# Patient Record
Sex: Female | Born: 1965 | Race: Black or African American | Hispanic: No | Marital: Married | Smoking: Never smoker
Health system: Southern US, Community
[De-identification: ages and names within clinical notes are randomized; demographics above are authoritative.]

## PROBLEM LIST (undated history)

## (undated) DIAGNOSIS — F419 Anxiety disorder, unspecified: Secondary | ICD-10-CM

## (undated) DIAGNOSIS — M5432 Sciatica, left side: Secondary | ICD-10-CM

## (undated) DIAGNOSIS — Z973 Presence of spectacles and contact lenses: Secondary | ICD-10-CM

## (undated) DIAGNOSIS — R030 Elevated blood-pressure reading, without diagnosis of hypertension: Secondary | ICD-10-CM

## (undated) DIAGNOSIS — R9431 Abnormal electrocardiogram [ECG] [EKG]: Secondary | ICD-10-CM

## (undated) DIAGNOSIS — E669 Obesity, unspecified: Secondary | ICD-10-CM

## (undated) DIAGNOSIS — J019 Acute sinusitis, unspecified: Secondary | ICD-10-CM

## (undated) DIAGNOSIS — R011 Cardiac murmur, unspecified: Secondary | ICD-10-CM

## (undated) DIAGNOSIS — E785 Hyperlipidemia, unspecified: Secondary | ICD-10-CM

## (undated) DIAGNOSIS — D75839 Thrombocytosis, unspecified: Secondary | ICD-10-CM

## (undated) DIAGNOSIS — J309 Allergic rhinitis, unspecified: Secondary | ICD-10-CM

## (undated) DIAGNOSIS — Z923 Personal history of irradiation: Secondary | ICD-10-CM

## (undated) DIAGNOSIS — I10 Essential (primary) hypertension: Secondary | ICD-10-CM

## (undated) DIAGNOSIS — D649 Anemia, unspecified: Secondary | ICD-10-CM

## (undated) DIAGNOSIS — Z9889 Other specified postprocedural states: Secondary | ICD-10-CM

## (undated) DIAGNOSIS — J329 Chronic sinusitis, unspecified: Secondary | ICD-10-CM

## (undated) DIAGNOSIS — N950 Postmenopausal bleeding: Secondary | ICD-10-CM

## (undated) DIAGNOSIS — F32A Depression, unspecified: Secondary | ICD-10-CM

## (undated) DIAGNOSIS — M1990 Unspecified osteoarthritis, unspecified site: Secondary | ICD-10-CM

## (undated) DIAGNOSIS — R7303 Prediabetes: Secondary | ICD-10-CM

## (undated) HISTORY — DX: Anemia, unspecified: D64.9

## (undated) HISTORY — DX: Thrombocytosis, unspecified: D75.839

## (undated) HISTORY — DX: Cardiac murmur, unspecified: R01.1

## (undated) HISTORY — DX: Essential (primary) hypertension: I10

## (undated) HISTORY — DX: Acute sinusitis, unspecified: J01.90

## (undated) HISTORY — DX: Abnormal electrocardiogram (ECG) (EKG): R94.31

## (undated) HISTORY — DX: Personal history of irradiation: Z92.3

## (undated) HISTORY — DX: Sciatica, left side: M54.32

## (undated) HISTORY — DX: Allergic rhinitis, unspecified: J30.9

## (undated) HISTORY — DX: Morbid (severe) obesity due to excess calories: E66.01

## (undated) HISTORY — DX: Elevated blood-pressure reading, without diagnosis of hypertension: R03.0

## (undated) HISTORY — DX: Chronic sinusitis, unspecified: J32.9

## (undated) HISTORY — DX: Unspecified osteoarthritis, unspecified site: M19.90

## (undated) HISTORY — DX: Other specified postprocedural states: Z98.890

## (undated) HISTORY — DX: Hyperlipidemia, unspecified: E78.5

## (undated) HISTORY — DX: Obesity, unspecified: E66.9

---

## 1999-04-17 ENCOUNTER — Other Ambulatory Visit: Admission: RE | Admit: 1999-04-17 | Discharge: 1999-04-17 | Payer: Self-pay | Admitting: *Deleted

## 2000-04-07 ENCOUNTER — Encounter: Payer: Self-pay | Admitting: Internal Medicine

## 2000-04-07 ENCOUNTER — Encounter: Admission: RE | Admit: 2000-04-07 | Discharge: 2000-04-07 | Payer: Self-pay | Admitting: Internal Medicine

## 2000-04-16 ENCOUNTER — Other Ambulatory Visit: Admission: RE | Admit: 2000-04-16 | Discharge: 2000-04-16 | Payer: Self-pay | Admitting: *Deleted

## 2001-05-27 ENCOUNTER — Other Ambulatory Visit: Admission: RE | Admit: 2001-05-27 | Discharge: 2001-05-27 | Payer: Self-pay | Admitting: Obstetrics and Gynecology

## 2002-02-17 ENCOUNTER — Encounter: Admission: RE | Admit: 2002-02-17 | Discharge: 2002-02-17 | Payer: Self-pay | Admitting: Internal Medicine

## 2002-02-17 ENCOUNTER — Encounter: Payer: Self-pay | Admitting: Internal Medicine

## 2002-07-01 ENCOUNTER — Other Ambulatory Visit: Admission: RE | Admit: 2002-07-01 | Discharge: 2002-07-01 | Payer: Self-pay | Admitting: Obstetrics and Gynecology

## 2003-09-22 ENCOUNTER — Other Ambulatory Visit: Admission: RE | Admit: 2003-09-22 | Discharge: 2003-09-22 | Payer: Self-pay | Admitting: Obstetrics and Gynecology

## 2005-06-02 HISTORY — PX: BREAST EXCISIONAL BIOPSY: SUR124

## 2005-10-23 ENCOUNTER — Encounter: Admission: RE | Admit: 2005-10-23 | Discharge: 2005-10-23 | Payer: Self-pay | Admitting: Obstetrics and Gynecology

## 2005-11-27 ENCOUNTER — Encounter: Admission: RE | Admit: 2005-11-27 | Discharge: 2005-11-27 | Payer: Self-pay | Admitting: Obstetrics and Gynecology

## 2006-02-27 ENCOUNTER — Ambulatory Visit (HOSPITAL_BASED_OUTPATIENT_CLINIC_OR_DEPARTMENT_OTHER): Admission: RE | Admit: 2006-02-27 | Discharge: 2006-02-27 | Payer: Self-pay | Admitting: General Surgery

## 2006-02-27 ENCOUNTER — Encounter: Admission: RE | Admit: 2006-02-27 | Discharge: 2006-02-27 | Payer: Self-pay | Admitting: General Surgery

## 2006-02-27 ENCOUNTER — Encounter (INDEPENDENT_AMBULATORY_CARE_PROVIDER_SITE_OTHER): Payer: Self-pay | Admitting: Specialist

## 2007-02-03 ENCOUNTER — Encounter: Admission: RE | Admit: 2007-02-03 | Discharge: 2007-02-03 | Payer: Self-pay | Admitting: Obstetrics and Gynecology

## 2010-06-23 ENCOUNTER — Encounter: Payer: Self-pay | Admitting: Obstetrics and Gynecology

## 2010-06-23 ENCOUNTER — Encounter (HOSPITAL_BASED_OUTPATIENT_CLINIC_OR_DEPARTMENT_OTHER): Payer: Self-pay | Admitting: General Surgery

## 2010-10-18 NOTE — Op Note (Signed)
[name], Carla Patrick            ACCOUNT NO.:  [id number]   MEDICAL RECORD NO.:  [id number]          PATIENT TYPE:  AMB   LOCATION:  DSC                          FACILITY:  [facility]   PHYSICIAN:  Leonie Man, M.D.   DATE OF BIRTH:  06/18/1965   DATE OF PROCEDURE:  DATE OF DISCHARGE:                                 OPERATIVE REPORT   PREOPERATIVE DIAGNOSIS:  Lesion of the left breast, probable radial scar  versus carcinoma.   POSTOPERATIVE DIAGNOSIS:  Lesion of the left breast, probable radial scar  versus carcinoma, pathology pending.   PROCEDURE:  Needle localization and excision of biopsy of left breast  tissue.   SURGEON:  Ballen.   ASSISTANT:  OR nurse.   ANESTHESIA:  General.   SPECIMENS TO THE LABORATORY:  Breast tissue, 2 specimens.   ESTIMATED BLOOD LOSS:  Minimal.   COMPLICATIONS:  None.   The patient went to the PACU in excellent condition.   Carla Patrick is a 45 year old female who, on routine mammogram, was noted to  have a lesion within the left breast which is radiologically a radial scar,  but carcinoma cannot be ruled out.  She comes to the operating room  following a needle localization for excision of this area.   She understands the risks and potential benefits of surgery.  She gives  consent to same.   PROCEDURE:  The patient was positioned supinely, and the left breast was  prepped and draped to be included in the sterile operative field.  There is  a localizing needle within the substance of the left breast.  I made a  transverse incision just adjacent to the localizing wire, deepening this to  the skin and subcutaneous tissue, using the wire to a central point, then  excised a large wedge of breast tissue from the subcutaneous tissues all the  way down to the chest wall, taking approximately a total of 5- to 7-cm swath  of tissue.  This was removed and forwarded for radiologic evaluation.  The  specimen on mammography did not show the scar.   Following a conversation  with the radiologist, additional tissue on the inferior-superior aspect of  the biopsy cavity was also removed.  This also did not show a radial scar.  At this point, we decided not to continue any additional breast excision but  to get a mammogram in a few weeks just to see where, if indeed, there is a  residual radial scar.  Hemostasis was obtained with electrocautery.  Sponge  and instrument counts were verified.  The breast tissues were then  reconstructed and reapproximated with interrupted 2-0 Vicryl sutures.  The  subcutaneous tissue was closed with 3-0 Vicryl sutures and the skin closed  with  5-0 Monocryl suture in a running subcuticular fashion and the reinforced  with Steri-Strips.  A sterile dressing was applied.  Anesthesia reversed.  Patient removed from the operating room to the recovery room in stable  condition.  She tolerated the procedure well.      Leonie Man, M.D.  Electronically Signed     PB/MEDQ  D:  02/27/2006  T:  02/27/2006  Job:  [id number]   cc:   Leonie Man, M.D.

## 2011-06-09 ENCOUNTER — Other Ambulatory Visit: Payer: Self-pay | Admitting: Internal Medicine

## 2011-06-09 DIAGNOSIS — R1013 Epigastric pain: Secondary | ICD-10-CM

## 2011-06-10 ENCOUNTER — Ambulatory Visit
Admission: RE | Admit: 2011-06-10 | Discharge: 2011-06-10 | Disposition: A | Discharge status: Home / Self Care | Payer: BC Managed Care – PPO | Source: Ambulatory Visit | Attending: Internal Medicine | Admitting: Internal Medicine

## 2011-06-10 DIAGNOSIS — R1013 Epigastric pain: Secondary | ICD-10-CM

## 2011-06-11 ENCOUNTER — Encounter (INDEPENDENT_AMBULATORY_CARE_PROVIDER_SITE_OTHER): Payer: Self-pay | Admitting: General Surgery

## 2011-06-11 ENCOUNTER — Ambulatory Visit (INDEPENDENT_AMBULATORY_CARE_PROVIDER_SITE_OTHER): Payer: Self-pay | Admitting: General Surgery

## 2011-06-11 VITALS — BP 118/76 | HR 68 | Temp 97.4°F | Resp 17 | Ht 59.0 in | Wt 204.6 lb

## 2011-06-11 DIAGNOSIS — K8020 Calculus of gallbladder without cholecystitis without obstruction: Secondary | ICD-10-CM

## 2011-06-11 NOTE — Progress Notes (Signed)
Patient ID: Carla Patrick, female   DOB: February 11, 1966, 46 y.o.   MRN: [id number]  Chief Complaint  Patient presents with  . Other    Eval gallbladder    HPI Carla Patrick is a 46 y.o. female.   HPI A 46 year old Carla Patrick female referred by Dr. Merri Brunette for evaluation of abdominal pain. The patient states that she's been having some infrequent abdominal pain for several months. At first it was very intermittent; but, now it is occurring more frequent. She describes it as a pain in her upper abdomen that goes across like a rubber band. The discomfort Will last several hours. It does not radiate. She will have some occasional nausea. She denies any fevers or chills. She denies any weight loss, jaundice, acholic stools, or reflux. She denies any NSAID use. She has had a long-standing problem with constipation. She states that her mother had to have emergency gallbladder surgery over the holidays. She has not noticed any correlation to any particular foods. Past Medical History  Diagnosis Date  . Anemia   . Asthma   . Heart murmur   . Hyperlipidemia   . Abdominal pain     Past Surgical History  Procedure Date  . Breast lumpectomy 2007    left    History reviewed. No pertinent family history.  Social History History  Substance Use Topics  . Smoking status: Never Smoker   . Smokeless tobacco: Never Used  . Alcohol Use: Yes     occasional glass of wine    No Known Allergies  Current Outpatient Prescriptions  Medication Sig Dispense Refill  . buPROPion (WELLBUTRIN XL) 300 MG 24 hr tablet Take 300 mg by mouth daily.      . drospirenone-ethinyl estradiol (YAZ,GIANVI,LORYNA) 3-0.02 MG tablet Take 1 tablet by mouth daily.        Review of Systems Review of Systems  Constitutional: Negative for fever, chills and unexpected weight change.  HENT: Negative for hearing loss, congestion, sore throat, trouble swallowing and voice change.   Eyes: Negative for visual  disturbance.  Respiratory: Negative for cough and wheezing.   Cardiovascular: Negative for chest pain, palpitations and leg swelling.  Gastrointestinal: Positive for constipation. Negative for vomiting, diarrhea, blood in stool, abdominal distention and anal bleeding.  Genitourinary: Negative for hematuria, vaginal bleeding and difficulty urinating.  Musculoskeletal: Negative for arthralgias.  Skin: Negative for rash and wound.  Neurological: Negative for seizures, syncope and headaches.  Hematological: Negative for adenopathy. Does not bruise/bleed easily.  Psychiatric/Behavioral: Negative for confusion.    Blood pressure 118/76, pulse 68, temperature 97.4 F (36.3 C), temperature source Temporal, resp. rate 17, height 4\' 11"  (1.499 m), weight 204 lb 9.6 oz (92.806 kg).  Physical Exam Physical Exam  Vitals reviewed. Constitutional: She is oriented to person, place, and time. She appears well-developed and well-nourished. No distress.       obese  HENT:  Head: Normocephalic and atraumatic.  Eyes: Conjunctivae are normal. No scleral icterus.  Neck: Normal range of motion. Neck supple. No JVD present. No tracheal deviation present. No thyromegaly present.  Cardiovascular: Normal rate, regular rhythm and normal heart sounds.   Pulmonary/Chest: Effort normal and breath sounds normal. No respiratory distress. She has no wheezes.  Abdominal: Soft. Bowel sounds are normal. She exhibits no distension. There is no tenderness. There is no rebound.  Musculoskeletal: Normal range of motion. She exhibits no edema and no tenderness.  Neurological: She is alert and oriented to person, place, and  time. She exhibits normal muscle tone.  Skin: Skin is warm and dry. No rash noted. She is not diaphoretic. No erythema.  Psychiatric: She has a normal mood and affect. Her behavior is normal. Thought content normal.    Data Reviewed Abd u/s COMPLETE ABDOMINAL ULTRASOUND  Comparison: None.  Findings:    Gallbladder: There are multiple gallstones within the gallbladder  with acoustical shadowing. No pain is present over the gallbladder  with compression.  Common bile duct: The common bile duct is normal measuring 3.8 mm  in diameter.  Liver: The liver has a normal echogenic pattern. No ductal  dilatation is seen.  IVC: Appears normal.  Pancreas: No focal abnormality seen.  Spleen: The spleen is normal measuring 4.6 cm sagittally.  Right Kidney: No hydronephrosis is seen. The right kidney  measures 10.5 cm sagittally.  Left Kidney: No hydronephrosis. The left kidney measures 10.9 cm.  Abdominal aorta: The abdominal aorta is normal in caliber.  IMPRESSION:  1. Multiple gallstones within the gallbladder. No pain over the  gallbladder with compression.  2. No ductal dilatation.   Assessment    Symptomatic cholelithiasis    Plan    We discussed gallbladder disease. The patient was given Agricultural engineer. We discussed non-operative and operative management.   I discussed laparoscopic cholecystectomy with possible cholangiogram in detail.  The patient was given educational material as well as diagrams detailing the procedure.  We discussed the risks and benefits of a laparoscopic cholecystectomy including, but not limited to bleeding, infection, injury to surrounding structures such as the intestine or liver, bile leak, retained gallstones, need to convert to an open procedure, prolonged diarrhea, blood clots such as  DVT, common bile duct injury, anesthesia risks, and possible need for additional procedures.  We discussed the typical post-operative recovery course. I explained that the likelihood of improvement of their symptoms is good.  Mary Sella. Andrey Campanile, MD, FACS General, Bariatric, & Minimally Invasive Surgery [facility], [place]         [facility] M 06/11/2011, 11:43 AM

## 2011-06-11 NOTE — Patient Instructions (Signed)

## 2011-07-02 ENCOUNTER — Encounter (INDEPENDENT_AMBULATORY_CARE_PROVIDER_SITE_OTHER): Payer: Self-pay

## 2011-10-02 ENCOUNTER — Encounter (HOSPITAL_COMMUNITY): Payer: Self-pay | Admitting: *Deleted

## 2011-10-02 ENCOUNTER — Inpatient Hospital Stay (HOSPITAL_COMMUNITY)
Admission: EM | Admit: 2011-10-02 | Discharge: 2011-10-05 | DRG: 494 | Disposition: A | Discharge status: Home / Self Care | Payer: BC Managed Care – PPO | Attending: General Surgery | Admitting: General Surgery

## 2011-10-02 DIAGNOSIS — E785 Hyperlipidemia, unspecified: Secondary | ICD-10-CM | POA: Diagnosis present

## 2011-10-02 DIAGNOSIS — K8064 Calculus of gallbladder and bile duct with chronic cholecystitis without obstruction: Principal | ICD-10-CM | POA: Diagnosis present

## 2011-10-02 DIAGNOSIS — R011 Cardiac murmur, unspecified: Secondary | ICD-10-CM | POA: Diagnosis present

## 2011-10-02 DIAGNOSIS — K8060 Calculus of gallbladder and bile duct with cholecystitis, unspecified, without obstruction: Principal | ICD-10-CM | POA: Diagnosis present

## 2011-10-02 DIAGNOSIS — K8010 Calculus of gallbladder with chronic cholecystitis without obstruction: Secondary | ICD-10-CM

## 2011-10-02 DIAGNOSIS — J45909 Unspecified asthma, uncomplicated: Secondary | ICD-10-CM | POA: Diagnosis present

## 2011-10-02 LAB — CBC
HCT: 36.4 % (ref 36.0–46.0)
MCHC: 34.1 g/dL (ref 30.0–36.0)
Platelets: 415 10*3/uL — ABNORMAL HIGH (ref 150–400)
RDW: 12.3 % (ref 11.5–15.5)
WBC: 9.2 10*3/uL (ref 4.0–10.5)

## 2011-10-02 LAB — COMPREHENSIVE METABOLIC PANEL
ALT: 227 U/L — ABNORMAL HIGH (ref 0–35)
AST: 274 U/L — ABNORMAL HIGH (ref 0–37)
Albumin: 3.7 g/dL (ref 3.5–5.2)
Alkaline Phosphatase: 132 U/L — ABNORMAL HIGH (ref 39–117)
CO2: 22 mEq/L (ref 19–32)
Chloride: 96 mEq/L (ref 96–112)
Creatinine, Ser: 0.62 mg/dL (ref 0.50–1.10)
GFR calc non Af Amer: >90 mL/min (ref >90)
Potassium: 3.9 mEq/L (ref 3.5–5.1)
Sodium: 134 mEq/L — ABNORMAL LOW (ref 135–145)
Total Bilirubin: 2 mg/dL — ABNORMAL HIGH (ref 0.3–1.2)

## 2011-10-02 LAB — URINALYSIS, ROUTINE W REFLEX MICROSCOPIC
Bilirubin Urine: NEGATIVE
Glucose, UA: NEGATIVE mg/dL
Protein, ur: NEGATIVE mg/dL

## 2011-10-02 LAB — DIFFERENTIAL
Basophils Absolute: 0 10*3/uL (ref 0.0–0.1)
Basophils Relative: 0 % (ref 0–1)
Lymphocytes Relative: 16 % (ref 12–46)
Neutro Abs: 7.2 10*3/uL (ref 1.7–7.7)
Neutrophils Relative %: 79 % — ABNORMAL HIGH (ref 43–77)

## 2011-10-02 LAB — URINE MICROSCOPIC-ADD ON

## 2011-10-02 LAB — LIPASE, BLOOD: Lipase: 45 U/L (ref 11–59)

## 2011-10-02 MED ORDER — HYDROMORPHONE HCL PF 1 MG/ML IJ SOLN
1.0000 mg | Freq: Once | INTRAMUSCULAR | Status: AC
  Administered 2011-10-02: 1 mg via INTRAVENOUS
  Filled 2011-10-02: qty 1

## 2011-10-02 MED ORDER — CIPROFLOXACIN IN D5W 400 MG/200ML IV SOLN
400.0000 mg | Freq: Two times a day (BID) | INTRAVENOUS | Status: DC
  Administered 2011-10-02 – 2011-10-04 (×4): 400 mg via INTRAVENOUS
  Filled 2011-10-02 (×5): qty 200

## 2011-10-02 MED ORDER — ONDANSETRON HCL 4 MG/2ML IJ SOLN
4.0000 mg | Freq: Once | INTRAMUSCULAR | Status: AC
  Administered 2011-10-02: 4 mg via INTRAVENOUS
  Filled 2011-10-02: qty 2

## 2011-10-02 MED ORDER — ONDANSETRON HCL 4 MG/2ML IJ SOLN
4.0000 mg | Freq: Four times a day (QID) | INTRAMUSCULAR | Status: DC | PRN
  Administered 2011-10-04: 4 mg via INTRAVENOUS
  Filled 2011-10-02: qty 2

## 2011-10-02 MED ORDER — SODIUM CHLORIDE 0.9 % IV SOLN
INTRAVENOUS | Status: DC
  Administered 2011-10-02: 23:00:00 via INTRAVENOUS
  Administered 2011-10-03: 100 mL/h via INTRAVENOUS
  Administered 2011-10-04: 50 mL via INTRAVENOUS

## 2011-10-02 MED ORDER — ACETAMINOPHEN 650 MG RE SUPP: 650.0000 mg | Freq: Four times a day (QID) | RECTAL | Status: DC | PRN

## 2011-10-02 MED ORDER — SODIUM CHLORIDE 0.9 % IV SOLN
1000.0000 mL | Freq: Once | INTRAVENOUS | Status: AC
  Administered 2011-10-02: 1000 mL via INTRAVENOUS

## 2011-10-02 MED ORDER — SODIUM CHLORIDE 0.9 % IV SOLN
1000.0000 mL | INTRAVENOUS | Status: DC
  Administered 2011-10-02: 1000 mL via INTRAVENOUS

## 2011-10-02 MED ORDER — MORPHINE SULFATE 2 MG/ML IJ SOLN
2.0000 mg | INTRAMUSCULAR | Status: DC | PRN
  Administered 2011-10-04 (×3): 2 mg via INTRAVENOUS
  Filled 2011-10-02 (×3): qty 1

## 2011-10-02 MED ORDER — ACETAMINOPHEN 325 MG PO TABS: 650.0000 mg | ORAL_TABLET | Freq: Four times a day (QID) | ORAL | Status: DC | PRN

## 2011-10-02 NOTE — ED Notes (Signed)
Pt reports upper left abdominal pain starting 3AM with N/V/D.  Pt reports history of gallstones.  Pt took acetaminophen for pain 1 gram this AM without relief.

## 2011-10-02 NOTE — ED Notes (Signed)
Pt states that abdominal pain started at 3 am. Pt states she had vomiting, nausea and diarrhea. Vomited X 3, diarrhea X 3. Pain below breast line, goes across and more on left side. Pt took generic acetaminophen no relief. Diagnosed with gallstones 2 months ago.

## 2011-10-02 NOTE — ED Provider Notes (Signed)
History     CSN: [id number]  Arrival date & time 10/02/11  1022   First MD Initiated Contact with Patient 10/02/11 1040      Chief Complaint  Patient presents with  . Abdominal Pain  . Nausea  . Emesis  . Diarrhea    HPI Patient presents to the emergency room with complaints of right upper abdominal pain. Patient states she woke up about 3 AM with complaints of nausea vomiting and some loose stools.  The pain is severe in the epigastrium and right upper quadrant. It increases with palpation. She states it might radiate to the back little bit but that is relatively minor. She tried taking Tylenol without relief. Patient has had similar symptoms in the past although it was not as bad. She was diagnosed with cholelithiasis several months ago but has not followed up with a surgeon that she's been trying to wait until the end of the semester. Patient is a Runner, broadcasting/film/video. Past Medical History  Diagnosis Date  . Anemia   . Asthma   . Heart murmur   . Hyperlipidemia   . Abdominal pain   . Gallstones     Past Surgical History  Procedure Date  . Breast lumpectomy 2007    left    No family history on file.  History  Substance Use Topics  . Smoking status: Never Smoker   . Smokeless tobacco: Never Used  . Alcohol Use: Yes     occasional glass of wine    OB History    Grav Para Term Preterm Abortions TAB SAB Ect Mult Living                  Review of Systems  All other systems reviewed and are negative.    Allergies  Review of patient's allergies indicates no known allergies.  Home Medications   Current Outpatient Rx  Name Route Sig Dispense Refill  . BUPROPION HCL ER (XL) 300 MG PO TB24 Oral Take 300 mg by mouth daily.    . DROSPIRENONE-ETHINYL ESTRADIOL 3-0.02 MG PO TABS Oral Take 1 tablet by mouth daily.    Marland Kitchen HYDROCHLOROTHIAZIDE 12.5 MG PO TABS Oral Take 12.5 mg by mouth daily as needed. swelling      BP 158/92  Pulse 99  Temp(Src) 98.7 F (37.1 C) (Oral)  Resp 18   SpO2 100%  Physical Exam  Nursing note and vitals reviewed. Constitutional: She appears well-developed and well-nourished. No distress.  HENT:  Head: Normocephalic and atraumatic.  Right Ear: External ear normal.  Left Ear: External ear normal.  Eyes: Conjunctivae are normal. Right eye exhibits no discharge. Left eye exhibits no discharge. No scleral icterus.  Neck: Neck supple. No tracheal deviation present.  Cardiovascular: Normal rate, regular rhythm and intact distal pulses.   Pulmonary/Chest: Effort normal and breath sounds normal. No stridor. No respiratory distress. She has no wheezes. She has no rales.  Abdominal: Soft. Bowel sounds are normal. She exhibits no distension. There is tenderness in the right upper quadrant and epigastric area. There is guarding and positive Murphy's sign. There is no rigidity and no rebound.  Musculoskeletal: She exhibits no edema and no tenderness.  Neurological: She is alert. She has normal strength. No sensory deficit. Cranial nerve deficit:  no gross defecits noted. She exhibits normal muscle tone. She displays no seizure activity. Coordination normal.  Skin: Skin is warm and dry. No rash noted.  Psychiatric: She has a normal mood and affect.    ED  Course  Procedures (including critical care time)  Medications  hydrochlorothiazide (HYDRODIURIL) 12.5 MG tablet (not administered)  0.9 %  sodium chloride infusion (1000 mL Intravenous New Bag/Given 10/02/11 1125)    Followed by  0.9 %  sodium chloride infusion (1000 mL Intravenous New Bag/Given 10/02/11 1311)  HYDROmorphone (DILAUDID) injection 1 mg (1 mg Intravenous Given 10/02/11 1125)  ondansetron (ZOFRAN) injection 4 mg (4 mg Intravenous Given 10/02/11 1127)    Labs Reviewed  CBC - Abnormal; Notable for the following:    RBC 3.84 (*)    Platelets 415 (*)    All other components within normal limits  DIFFERENTIAL - Abnormal; Notable for the following:    Neutrophils Relative 79 (*)    All  other components within normal limits  COMPREHENSIVE METABOLIC PANEL - Abnormal; Notable for the following:    Sodium 134 (*)    Glucose, Bld 124 (*)    AST 274 (*)    ALT 227 (*)    Alkaline Phosphatase 132 (*)    Total Bilirubin 2.0 (*)    All other components within normal limits  URINALYSIS, ROUTINE W REFLEX MICROSCOPIC - Abnormal; Notable for the following:    Hgb urine dipstick SMALL (*)    Ketones, ur 40 (*)    Urobilinogen, UA 2.0 (*)    All other components within normal limits  LIPASE, BLOOD  URINE MICROSCOPIC-ADD ON   No results found.  1. Cholelithiasis     MDM  Pt presents with recurrent biliary colic.  Abdominal pain has improved however she does have elevated bilirubin, ?choledocholithiasis.  Pt had been trying to wait for the end of the school year before getting surgery but she may not be able to wait at this time.  Discussed with Dr Dwain Sarna who has seen the patient.  He plans on admitting her for surgery and serial observation of her bilirubin,        Celene Kras, MD 10/02/11 [phone]

## 2011-10-02 NOTE — H&P (Signed)
Carla Patrick is an 46 y.o. female.   Chief Complaint: ab pain, referred by Dr. Linwood Dibbles HPI: 75 yof with a couple year history of ruq/epigastric pain that has become more frequent in last 6 months.  This is associated with nausea and usually will pass on its own. She underwent ultrasound earlier this year with gallstones noted.  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  She has seen someone from my practice.  Today she began having epigastric and ruq pain that is not going away.  It is the worst and longest episode.  She still states it is sore but pain meds have helped.    Past Medical History  Diagnosis Date  . Anemia   . Asthma   . Heart murmur   . Hyperlipidemia   . Abdominal pain   . Gallstones     Past Surgical History  Procedure Date  . Breast excisional biopsy 2007    left    History reviewed. No pertinent family history. Social History:  reports that she has never smoked. She has never used smokeless tobacco. She reports that she drinks alcohol. She reports that she does not use illicit drugs.  Allergies: No Known Allergies   (Not in a hospital admission)  Results for orders placed during the hospital encounter of 10/02/11 (from the past 48 hour(s))  URINALYSIS, ROUTINE W REFLEX MICROSCOPIC     Status: Abnormal   Collection Time   10/02/11 10:56 AM      Component Value Range Comment   Color, Urine YELLOW  YELLOW     APPearance CLEAR  CLEAR     Specific Gravity, Urine 1.017  1.005 - 1.030     pH 7.5  5.0 - 8.0     Glucose, UA NEGATIVE  NEGATIVE (mg/dL)    Hgb urine dipstick SMALL (*) NEGATIVE     Bilirubin Urine NEGATIVE  NEGATIVE     Ketones, ur 40 (*) NEGATIVE (mg/dL)    Protein, ur NEGATIVE  NEGATIVE (mg/dL)    Urobilinogen, UA 2.0 (*) 0.0 - 1.0 (mg/dL)    Nitrite NEGATIVE  NEGATIVE     Leukocytes, UA NEGATIVE  NEGATIVE    URINE MICROSCOPIC-ADD ON     Status: Normal   Collection Time     10/02/11 10:56 AM      Component Value Range Comment   Squamous Epithelial / LPF RARE  RARE     WBC, UA 0-2  <3 (WBC/hpf)    RBC / HPF 3-6  <3 (RBC/hpf)    Bacteria, UA RARE  RARE    CBC     Status: Abnormal   Collection Time   10/02/11 11:20 AM      Component Value Range Comment   WBC 9.2  4.0 - 10.5 (K/uL)    RBC 3.84 (*) 3.87 - 5.11 (MIL/uL)    Hemoglobin 12.4  12.0 - 15.0 (g/dL)    HCT 16.1  09.6 - 04.5 (%)    MCV 94.8  78.0 - 100.0 (fL)    MCH 32.3  26.0 - 34.0 (pg)    MCHC 34.1  30.0 - 36.0 (g/dL)    RDW 40.9  81.1 - 91.4 (%)    Platelets 415 (*) 150 - 400 (K/uL)   DIFFERENTIAL     Status: Abnormal   Collection Time   10/02/11 11:20 AM      Component Value Range  Comment   Neutrophils Relative 79 (*) 43 - 77 (%)    Neutro Abs 7.2  1.7 - 7.7 (K/uL)    Lymphocytes Relative 16  12 - 46 (%)    Lymphs Abs 1.5  0.7 - 4.0 (K/uL)    Monocytes Relative 5  3 - 12 (%)    Monocytes Absolute 0.4  0.1 - 1.0 (K/uL)    Eosinophils Relative 0  0 - 5 (%)    Eosinophils Absolute 0.0  0.0 - 0.7 (K/uL)    Basophils Relative 0  0 - 1 (%)    Basophils Absolute 0.0  0.0 - 0.1 (K/uL)   COMPREHENSIVE METABOLIC PANEL     Status: Abnormal   Collection Time   10/02/11 11:20 AM      Component Value Range Comment   Sodium 134 (*) 135 - 145 (mEq/L)    Potassium 3.9  3.5 - 5.1 (mEq/L)    Chloride 96  96 - 112 (mEq/L)    CO2 22  19 - 32 (mEq/L)    Glucose, Bld 124 (*) 70 - 99 (mg/dL)    BUN 7  6 - 23 (mg/dL)    Creatinine, Ser 1.61  0.50 - 1.10 (mg/dL)    Calcium 9.4  8.4 - 10.5 (mg/dL)    Total Protein 7.6  6.0 - 8.3 (g/dL)    Albumin 3.7  3.5 - 5.2 (g/dL)    AST 096 (*) 0 - 37 (U/L)    ALT 227 (*) 0 - 35 (U/L)    Alkaline Phosphatase 132 (*) 39 - 117 (U/L)    Total Bilirubin 2.0 (*) 0.3 - 1.2 (mg/dL)    GFR calc non Af Amer >90  >90 (mL/min)    GFR calc Af Amer >90  >90 (mL/min)   LIPASE, BLOOD     Status: Normal   Collection Time   10/02/11 11:20 AM      Component Value Range Comment   Lipase  45  11 - 59 (U/L)    No results found.  Review of Systems  Constitutional: Negative for fever, chills and weight loss.  Respiratory: Negative for cough.   Cardiovascular: Negative for chest pain.  Gastrointestinal: Positive for nausea and abdominal pain. Negative for vomiting and diarrhea.    Blood pressure 125/65, pulse 80, temperature 98.3 F (36.8 C), temperature source Oral, resp. rate 16, SpO2 99.00%. Physical Exam  Vitals reviewed. Constitutional: She appears well-developed and well-nourished.  Eyes: No scleral icterus.  Neck: Neck supple.  Cardiovascular: Normal rate, regular rhythm and normal heart sounds.   Respiratory: Effort normal and breath sounds normal. She has no wheezes. She has no rales.  GI: Soft. Normal appearance and bowel sounds are normal. There is tenderness in the right upper quadrant and epigastric area. No hernia.     Assessment/Plan symptomatic cholelithiasis possible choledocholithiasis  I will admit her.  Her lfts are elevated and I would like to recheck in am.  If increasing then will consult gi.  If better then will plan for lap chole with cholangiogram tomorrow.  She understands plan.  Zelina Jimerson 10/02/2011, 1:41 PM

## 2011-10-03 LAB — CBC
Platelets: 333 10*3/uL (ref 150–400)
RBC: 3.21 MIL/uL — ABNORMAL LOW (ref 3.87–5.11)
RDW: 12.7 % (ref 11.5–15.5)
WBC: 8.9 10*3/uL (ref 4.0–10.5)

## 2011-10-03 LAB — COMPREHENSIVE METABOLIC PANEL
BUN: 6 mg/dL (ref 6–23)
CO2: 23 mEq/L (ref 19–32)
Chloride: 101 mEq/L (ref 96–112)
Creatinine, Ser: 0.71 mg/dL (ref 0.50–1.10)
GFR calc Af Amer: >90 mL/min (ref >90)
GFR calc non Af Amer: >90 mL/min (ref >90)
Glucose, Bld: 116 mg/dL — ABNORMAL HIGH (ref 70–99)
Total Bilirubin: 0.9 mg/dL (ref 0.3–1.2)

## 2011-10-03 NOTE — Progress Notes (Signed)
She is ready for cholecystectomy but will not be able to do today due to more urgent surgeries, will plan on lap chole tomorrow

## 2011-10-03 NOTE — Progress Notes (Signed)
  Subjective: Pt reports her pain is improved overall. She wonders if she can eat some today as her surgery will not be until tomorrow.  She is not passing much flatus, but her N/V have resolved.   Objective: Vital signs in last 24 hours: Temp:  [98 F (36.7 C)-98.9 F (37.2 C)] 98.9 F (37.2 C) (05/03 0607) Pulse Rate:  [80-99] 94  (05/03 0607) Resp:  [16-20] 20  (05/03 0607) BP: (118-158)/(65-92) 125/78 mmHg (05/03 0607) SpO2:  [99 %-100 %] 100 % (05/03 0607) Weight:  [93.441 kg (206 lb)] 93.441 kg (206 lb) (05/02 1528) Last BM Date: 10/02/11  Intake/Output from previous day: 05/02 0701 - 05/03 0700 In: 660 [P.O.:660] Out: -  Intake/Output this shift:    General appearance: alert, cooperative, appears stated age and no distress Resp: clear to auscultation bilaterally Cardio: regular rate and rhythm GI: soft, non-tender; bowel sounds normal; no masses,  no organomegaly  Lab Results:   Basename 10/03/11 0425 10/02/11 1120  WBC 8.9 9.2  HGB 10.5* 12.4  HCT 30.9* 36.4  PLT 333 415*   BMET  Basename 10/03/11 0425 10/02/11 1120  NA 138 134*  K 3.3* 3.9  CL 101 96  CO2 23 22  GLUCOSE 116* 124*  BUN 6 7  CREATININE 0.71 0.62  CALCIUM 8.4 9.4   PT/INR No results found for this basename: LABPROT:2,INR:2 in the last 72 hours ABG No results found for this basename: PHART:2,PCO2:2,PO2:2,HCO3:2 in the last 72 hours  Studies/Results: No results found.  Anti-infectives: Anti-infectives     Start     Dose/Rate Route Frequency Ordered Stop   10/02/11 1600   ciprofloxacin (CIPRO) IVPB 400 mg        400 mg 200 mL/hr over 60 Minutes Intravenous Every 12 hours 10/02/11 1527            Assessment/Plan: s/p * No surgery found * Symptomatic cholelithiasis- Will plan for OR tomorrow, May have clears today.    LOS: 1 day    Eileen Kangas,PA-C [phone]

## 2011-10-04 ENCOUNTER — Encounter (HOSPITAL_COMMUNITY): Payer: Self-pay | Admitting: Anesthesiology

## 2011-10-04 ENCOUNTER — Inpatient Hospital Stay (HOSPITAL_COMMUNITY): Payer: BC Managed Care – PPO

## 2011-10-04 ENCOUNTER — Encounter (HOSPITAL_COMMUNITY): Admission: EM | Disposition: A | Discharge status: Home / Self Care | Payer: Self-pay | Source: Home / Self Care

## 2011-10-04 ENCOUNTER — Inpatient Hospital Stay (HOSPITAL_COMMUNITY): Payer: BC Managed Care – PPO | Admitting: Anesthesiology

## 2011-10-04 HISTORY — PX: CHOLECYSTECTOMY: SHX55

## 2011-10-04 LAB — SURGICAL PCR SCREEN: MRSA, PCR: NEGATIVE

## 2011-10-04 SURGERY — LAPAROSCOPIC CHOLECYSTECTOMY WITH INTRAOPERATIVE CHOLANGIOGRAM
Anesthesia: General | Site: Abdomen | Wound class: Contaminated

## 2011-10-04 MED ORDER — ACETAMINOPHEN 650 MG RE SUPP: 650.0000 mg | Freq: Four times a day (QID) | RECTAL | Status: DC | PRN

## 2011-10-04 MED ORDER — LACTATED RINGERS IV SOLN
INTRAVENOUS | Status: DC | PRN
  Administered 2011-10-04: 08:00:00 via INTRAVENOUS

## 2011-10-04 MED ORDER — LIDOCAINE HCL (CARDIAC) 20 MG/ML IV SOLN
INTRAVENOUS | Status: DC | PRN
  Administered 2011-10-04: 20 mg via INTRAVENOUS

## 2011-10-04 MED ORDER — MIDAZOLAM HCL 5 MG/5ML IJ SOLN
INTRAMUSCULAR | Status: DC | PRN
  Administered 2011-10-04: 2 mg via INTRAVENOUS

## 2011-10-04 MED ORDER — BUPROPION HCL ER (XL) 300 MG PO TB24
300.0000 mg | ORAL_TABLET | Freq: Every day | ORAL | Status: DC
  Administered 2011-10-04: 300 mg via ORAL
  Filled 2011-10-04 (×2): qty 1

## 2011-10-04 MED ORDER — HYDROMORPHONE HCL PF 1 MG/ML IJ SOLN
INTRAMUSCULAR | Status: DC | PRN
  Administered 2011-10-04 (×4): 0.5 mg via INTRAVENOUS

## 2011-10-04 MED ORDER — LACTATED RINGERS IR SOLN
Status: DC | PRN
  Administered 2011-10-04: 1000 mL

## 2011-10-04 MED ORDER — BUPIVACAINE HCL (PF) 0.25 % IJ SOLN
INTRAMUSCULAR | Status: DC | PRN
  Administered 2011-10-04: 15 mL

## 2011-10-04 MED ORDER — NEOSTIGMINE METHYLSULFATE 1 MG/ML IJ SOLN
INTRAMUSCULAR | Status: DC | PRN
  Administered 2011-10-04: 5 mg via INTRAVENOUS

## 2011-10-04 MED ORDER — CISATRACURIUM BESYLATE 2 MG/ML IV SOLN
INTRAVENOUS | Status: DC | PRN
  Administered 2011-10-04 (×2): 1 mg via INTRAVENOUS
  Administered 2011-10-04: 10 mg via INTRAVENOUS

## 2011-10-04 MED ORDER — PROPOFOL 10 MG/ML IV EMUL
INTRAVENOUS | Status: DC | PRN
  Administered 2011-10-04: 200 mg via INTRAVENOUS

## 2011-10-04 MED ORDER — 0.9 % SODIUM CHLORIDE (POUR BTL) OPTIME
TOPICAL | Status: DC | PRN
  Administered 2011-10-04: 1000 mL

## 2011-10-04 MED ORDER — DEXAMETHASONE SODIUM PHOSPHATE 4 MG/ML IJ SOLN
INTRAMUSCULAR | Status: DC | PRN
  Administered 2011-10-04 (×2): 5 mg via INTRAVENOUS

## 2011-10-04 MED ORDER — IOHEXOL 300 MG/ML  SOLN
INTRAMUSCULAR | Status: DC | PRN
  Administered 2011-10-04: 50 mL

## 2011-10-04 MED ORDER — GLYCOPYRROLATE 0.2 MG/ML IJ SOLN
INTRAMUSCULAR | Status: DC | PRN
  Administered 2011-10-04: .6 mg via INTRAVENOUS

## 2011-10-04 MED ORDER — LIP MEDEX EX OINT
TOPICAL_OINTMENT | CUTANEOUS | Status: AC
  Administered 2011-10-04: 14:00:00
  Filled 2011-10-04: qty 7

## 2011-10-04 MED ORDER — KETAMINE HCL 10 MG/ML IJ SOLN
INTRAMUSCULAR | Status: DC | PRN
  Administered 2011-10-04 (×2): 5 mg via INTRAVENOUS

## 2011-10-04 MED ORDER — HYDROMORPHONE HCL PF 1 MG/ML IJ SOLN: 0.2500 mg | INTRAMUSCULAR | Status: DC | PRN

## 2011-10-04 MED ORDER — CIPROFLOXACIN IN D5W 400 MG/200ML IV SOLN
400.0000 mg | Freq: Two times a day (BID) | INTRAVENOUS | Status: AC
  Administered 2011-10-04 – 2011-10-05 (×2): 400 mg via INTRAVENOUS
  Filled 2011-10-04 (×2): qty 200

## 2011-10-04 MED ORDER — ONDANSETRON HCL 4 MG/2ML IJ SOLN
INTRAMUSCULAR | Status: DC | PRN
  Administered 2011-10-04 (×2): 2 mg via INTRAVENOUS

## 2011-10-04 MED ORDER — OXYCODONE HCL 5 MG PO TABS
5.0000 mg | ORAL_TABLET | ORAL | Status: DC | PRN
  Administered 2011-10-04 – 2011-10-05 (×3): 5 mg via ORAL
  Filled 2011-10-04: qty 1
  Filled 2011-10-04: qty 34
  Filled 2011-10-04: qty 1

## 2011-10-04 MED ORDER — ACETAMINOPHEN 325 MG PO TABS
650.0000 mg | ORAL_TABLET | Freq: Four times a day (QID) | ORAL | Status: DC | PRN
  Administered 2011-10-05: 650 mg via ORAL
  Filled 2011-10-04 (×2): qty 2

## 2011-10-04 MED ORDER — SUCCINYLCHOLINE CHLORIDE 20 MG/ML IJ SOLN
INTRAMUSCULAR | Status: DC | PRN
  Administered 2011-10-04: 140 mg via INTRAVENOUS

## 2011-10-04 MED ORDER — FENTANYL CITRATE 0.05 MG/ML IJ SOLN
INTRAMUSCULAR | Status: DC | PRN
  Administered 2011-10-04 (×3): 50 ug via INTRAVENOUS
  Administered 2011-10-04: 100 ug via INTRAVENOUS

## 2011-10-04 MED FILL — Cisatracurium Besylate (PF) IV Soln 10 MG/5ML (2 MG/ML): INTRAVENOUS | Qty: 10 | Status: AC

## 2011-10-04 SURGICAL SUPPLY — 43 items
ADH SKN CLS APL DERMABOND .7 (GAUZE/BANDAGES/DRESSINGS) ×1
APL SKNCLS STERI-STRIP NONHPOA (GAUZE/BANDAGES/DRESSINGS)
APPLIER CLIP 5 13 M/L LIGAMAX5 (MISCELLANEOUS) ×2
APPLIER CLIP ROT 10 11.4 M/L (STAPLE)
APR CLP MED LRG 11.4X10 (STAPLE)
APR CLP MED LRG 5 ANG JAW (MISCELLANEOUS) ×1
BAG SPEC RTRVL LRG 6X4 10 (ENDOMECHANICALS) ×1
BENZOIN TINCTURE PRP APPL 2/3 (GAUZE/BANDAGES/DRESSINGS) IMPLANT
CABLE HI FREQUENCY MONOPOLAR (ELECTROSURGICAL) ×2 IMPLANT
CANISTER SUCTION 2500CC (MISCELLANEOUS) ×2 IMPLANT
CLIP APPLIE 5 13 M/L LIGAMAX5 (MISCELLANEOUS) ×1 IMPLANT
CLIP APPLIE ROT 10 11.4 M/L (STAPLE) IMPLANT
CLOTH BEACON ORANGE TIMEOUT ST (SAFETY) ×2 IMPLANT
COVER MAYO STAND STRL (DRAPES) ×1 IMPLANT
DECANTER SPIKE VIAL GLASS SM (MISCELLANEOUS) ×2 IMPLANT
DERMABOND ADVANCED (GAUZE/BANDAGES/DRESSINGS) ×1
DERMABOND ADVANCED .7 DNX12 (GAUZE/BANDAGES/DRESSINGS) IMPLANT
DRAPE C-ARM 42X72 X-RAY (DRAPES) ×2 IMPLANT
DRAPE LAPAROSCOPIC ABDOMINAL (DRAPES) ×2 IMPLANT
ELECT REM PT RETURN 9FT ADLT (ELECTROSURGICAL) ×2
ELECTRODE REM PT RTRN 9FT ADLT (ELECTROSURGICAL) ×1 IMPLANT
GLOVE BIO SURGEON STRL SZ7 (GLOVE) ×2 IMPLANT
GLOVE BIOGEL PI IND STRL 7.5 (GLOVE) ×1 IMPLANT
GLOVE BIOGEL PI INDICATOR 7.5 (GLOVE) ×1
GOWN PREVENTION PLUS LG XLONG (DISPOSABLE) ×2 IMPLANT
GOWN PREVENTION PLUS XLARGE (GOWN DISPOSABLE) ×2 IMPLANT
GOWN STRL NON-REIN LRG LVL3 (GOWN DISPOSABLE) ×2 IMPLANT
GOWN STRL REIN XL XLG (GOWN DISPOSABLE) ×2 IMPLANT
HEMOSTAT SNOW SURGICEL 2X4 (HEMOSTASIS) ×1 IMPLANT
KIT BASIN OR (CUSTOM PROCEDURE TRAY) ×2 IMPLANT
NS IRRIG 1000ML POUR BTL (IV SOLUTION) ×2 IMPLANT
POUCH SPECIMEN RETRIEVAL 10MM (ENDOMECHANICALS) ×2 IMPLANT
SET CHOLANGIOGRAPH MIX (MISCELLANEOUS) IMPLANT
SET IRRIG TUBING LAPAROSCOPIC (IRRIGATION / IRRIGATOR) ×2 IMPLANT
SOLUTION ANTI FOG 6CC (MISCELLANEOUS) ×2 IMPLANT
STRIP CLOSURE SKIN 1/2X4 (GAUZE/BANDAGES/DRESSINGS) IMPLANT
SUT MNCRL AB 4-0 PS2 18 (SUTURE) ×2 IMPLANT
TOWEL OR 17X26 10 PK STRL BLUE (TOWEL DISPOSABLE) ×2 IMPLANT
TRAY LAP CHOLE (CUSTOM PROCEDURE TRAY) ×2 IMPLANT
TROCAR BLADELESS OPT 5 75 (ENDOMECHANICALS) ×6 IMPLANT
TROCAR XCEL BLUNT TIP 100MML (ENDOMECHANICALS) ×2 IMPLANT
TROCAR XCEL NON-BLD 11X100MML (ENDOMECHANICALS) IMPLANT
TUBING INSUFFLATION 10FT LAP (TUBING) ×2 IMPLANT

## 2011-10-04 NOTE — Interval H&P Note (Signed)
History and Physical Interval Note:  10/04/2011 7:55 AM  Carla Patrick  has presented today for surgery, with the diagnosis of cholecystitis  The various methods of treatment have been discussed with the patient and family. After consideration of risks, benefits and other options for treatment, the patient has consented to  Procedure(s) (LRB): LAPAROSCOPIC CHOLECYSTECTOMY WITH INTRAOPERATIVE CHOLANGIOGRAM (N/A) as a surgical intervention .  The patients' history has been reviewed, patient examined, no change in status, stable for surgery.  I have reviewed the patients' chart and labs.  Questions were answered to the patient's satisfaction.     Kelsen Celona

## 2011-10-04 NOTE — Op Note (Signed)
Preoperative diagnosis: Symptomatic cholelithiasis Postoperative diagnosis: Chronic cholecystitis Procedure: Laparoscopic cholecystectomy with cholangiogram Surgeon: Dr. Harden Mo Asst.: Dr. Avel Peace Specimens: Gallbladder and contents to pathology Complications: None Estimated blood loss: Minimal Anesthesia: Gen. Endotracheal Disposition to recovery in stable condition Sponge and needle count correct x2 at end of operation  Indications: This is a 46 year old female who was seen by one of my partners for biliary colic a number of months ago. She had an ultrasound showing stones. 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 She then had acute worsening of her pain. Her bilirubin was mildly elevated. This returned to normal the following day. Her transaminases were still mildly elevated. We discussed a laparoscopic cholecystectomy and the risks and benefits associated with that.  Procedure: After informed consent was obtained the patient was taken to the operating room. She was administered ciprofloxacin on the floor. Sequential compression devices were on her legs. She was then placed under general endotracheal anesthesia without complication. Her abdomen was then prepped and draped in the standard sterile surgical fashion. A surgical timeout was then performed.  I infiltrated Marcaine below her umbilicus. I made a vertical incision and carried this down to her fascia. Her fascia was entered sharply. Her peritoneum was entered bluntly. I then placed a 0 Vicryl pursestring suture through the fascia. A Hassan trocar was introduced. The abdomen was insufflated to 15 mmHg pressure. I then placed 3 further 5 mm trocars in the epigastrium and right upper quadrant under direct vision after infiltration with local anesthetic without complication. The gallbladder was then retracted cephalad. There were some adhesions to her duodenum which were taken down bluntly. Her  triangle was very scarred in. It took some time but eventually I was able to obtain the critical view of safety. I then clipped the artery and divided it. I then clipped the duct distally. I introduced a Cook catheter through a ductotomy. I then clipped this down. A cholangiogram was then performed. This showed some very slow filling of the duodenum. I think this is mostly due to spasm of her sphincter. There were no stones that were identified. It also showed that I was in the cystic duct and filling of the liver on both sides. I then removed the catheter. I clipped the duct 3 times and divided it. I then removed the gallbladder from the liver bed with some difficulty as it was chronically scarred to the liver. He was then placed in an Endo Catch bag and removed from the umbilicus. I had to enlarge this incision a little bit just to get the gallbladder out. I then obtained hemostasis. I did place a piece of Surgicel overlying the gallbladder bed. Irrigation was performed until it was clear. I then removed the [facility] trocar. I tied my pursestring and this completely obliterated the defect. There was no evidence of an entry injury. I then desufflated the abdomen removed all trocars. The incisions were closed for Monocryl and Dermabond. She tolerated this well was extubated and transferred to recovery stable.

## 2011-10-04 NOTE — Anesthesia Postprocedure Evaluation (Signed)
  Anesthesia Post-op Note  Patient: Carla Patrick  Procedure(s) Performed: Procedure(s) (LRB): LAPAROSCOPIC CHOLECYSTECTOMY WITH INTRAOPERATIVE CHOLANGIOGRAM (N/A)  Patient Location: PACU  Anesthesia Type: General  Level of Consciousness: oriented and sedated  Airway and Oxygen Therapy: Patient Spontanous Breathing and Patient connected to nasal cannula oxygen  Post-op Pain: mild  Post-op Assessment: Post-op Vital signs reviewed, Patient's Cardiovascular Status Stable, Respiratory Function Stable and Patent Airway  Post-op Vital Signs: stable  Complications: No apparent anesthesia complications

## 2011-10-04 NOTE — Transfer of Care (Signed)
Immediate Anesthesia Transfer of Care Note  Patient: Carla Patrick  Procedure(s) Performed: Procedure(s) (LRB): LAPAROSCOPIC CHOLECYSTECTOMY WITH INTRAOPERATIVE CHOLANGIOGRAM (N/A)  Patient Location: PACU  Anesthesia Type: General  Level of Consciousness: awake and alert   Airway & Oxygen Therapy: Patient Spontanous Breathing  Post-op Assessment: Report given to PACU RN and Post -op Vital signs reviewed and stable  Post vital signs: Reviewed and stable  Complications: No apparent anesthesia complications

## 2011-10-04 NOTE — Anesthesia Preprocedure Evaluation (Signed)
Anesthesia Evaluation  Patient identified by MRN, date of birth, ID band Patient awake    Reviewed: Allergy & Precautions, H&P , NPO status , Patient's Chart, lab work & pertinent test results, reviewed documented beta blocker date and time   Airway Mallampati: II TM Distance: >3 FB Neck ROM: Full    Dental  (+) Teeth Intact and Dental Advisory Given   Pulmonary asthma ,  Inhaler prn, infrequent use breath sounds clear to auscultation        Cardiovascular negative cardio ROS  Rhythm:Regular Rate:Normal  Denies cardiac symptoms   Neuro/Psych negative neurological ROS  negative psych ROS   GI/Hepatic Neg liver ROS, cholecystitis   Endo/Other  negative endocrine ROSMorbid obesity  Renal/GU negative Renal ROS  negative genitourinary   Musculoskeletal negative musculoskeletal ROS (+)   Abdominal   Peds negative pediatric ROS (+)  Hematology Anemia Hgb 10.5   Anesthesia Other Findings   Reproductive/Obstetrics negative OB ROS                           Anesthesia Physical Anesthesia Plan  ASA: II  Anesthesia Plan: General   Post-op Pain Management:    Induction: Intravenous  Airway Management Planned: Oral ETT  Additional Equipment:   Intra-op Plan:   Post-operative Plan: Extubation in OR  Informed Consent: I have reviewed the patients History and Physical, chart, labs and discussed the procedure including the risks, benefits and alternatives for the proposed anesthesia with the patient or authorized representative who has indicated his/her understanding and acceptance.   Dental advisory given  Plan Discussed with: CRNA and Surgeon  Anesthesia Plan Comments:         Anesthesia Quick Evaluation

## 2011-10-05 LAB — COMPREHENSIVE METABOLIC PANEL
ALT: 172 U/L — ABNORMAL HIGH (ref 0–35)
Albumin: 3 g/dL — ABNORMAL LOW (ref 3.5–5.2)
Alkaline Phosphatase: 127 U/L — ABNORMAL HIGH (ref 39–117)
GFR calc Af Amer: >90 mL/min (ref >90)
Glucose, Bld: 189 mg/dL — ABNORMAL HIGH (ref 70–99)
Potassium: 3.6 mEq/L (ref 3.5–5.1)
Sodium: 134 mEq/L — ABNORMAL LOW (ref 135–145)
Total Protein: 6.6 g/dL (ref 6.0–8.3)

## 2011-10-05 MED ORDER — OXYCODONE HCL 5 MG PO TABS: 5.0000 mg | ORAL_TABLET | ORAL | Status: AC | PRN

## 2011-10-05 NOTE — Progress Notes (Signed)
Pt discharged to home with mother and sister provided discharge instructions and prescriptions along with handouts. Pt verbalized understanding of discharge information. Pt stable. Pt transported by tech IV removed and documented. Jaydah Stahle Howell, RN    

## 2011-10-05 NOTE — Discharge Instructions (Signed)
[facility] LAPAROSCOPIC SURGERY: POST OP INSTRUCTIONS  Always review your discharge instruction sheet given to you by the facility where your surgery was performed. IF YOU HAVE DISABILITY OR FAMILY LEAVE FORMS, YOU MUST BRING THEM TO THE OFFICE FOR PROCESSING.   DO NOT GIVE THEM TO YOUR DOCTOR.  1. A prescription for pain medication may be given to you upon discharge.  Take your pain medication as prescribed, if needed.  If narcotic pain medicine is not needed, then you may take acetaminophen (Tylenol), naprosyn (Alleve), or ibuprofen (Advil) as needed. 2. Take your usually prescribed medications unless otherwise directed. 3. If you need a refill on your pain medication, please contact your pharmacy.  They will contact our office to request authorization. Prescriptions will not be filled after 5pm or on week-ends. 4. You should follow a light diet the first few days after arrival home, such as soup and crackers, etc.  Be sure to include lots of fluids daily. 5. Most patients will experience some swelling and bruising in the area of the incisions.  Ice packs will help.  Swelling and bruising can take several days to resolve.  6. It is common to experience some constipation if taking pain medication after surgery.  Increasing fluid intake and taking a stool softener (such as Colace) will usually help or prevent this problem from occurring.  A mild laxative (Milk of Magnesia or Miralax) should be taken according to package instructions if there are no bowel movements after 48 hours. 7. Unless discharge instructions indicate otherwise, you may remove your bandages 48 hours after surgery, and you may shower at that time.  You may have steri-strips (small skin tapes) in place directly over the incision.  These strips should be left on the skin for 7-10 days.  If your surgeon used skin glue on the incision, you may shower in 24 hours.  The glue will flake  off over the next 2-3 weeks.  Any sutures or staples will be removed at the office during your follow-up visit. 8. ACTIVITIES:  You may resume regular (light) daily activities beginning the next day--such as daily self-care, walking, climbing stairs--gradually increasing activities as tolerated.  You may have sexual intercourse when it is comfortable.  Refrain from any heavy lifting or straining until approved by your doctor. a. You may drive when you are no longer taking prescription pain medication, you can comfortably wear a seatbelt, and you can safely maneuver your car and apply brakes. b. RETURN TO WORK:  __________________________________________________________ 9. You should see your doctor in the office for a follow-up appointment approximately 2-3 weeks after your surgery.  Make sure that you call for this appointment within a day or two after you arrive home to insure a convenient appointment time. 10. OTHER INSTRUCTIONS: __________________________________________________________________________________________________________________________ __________________________________________________________________________________________________________________________ WHEN TO CALL YOUR DOCTOR: 1. Fever over 101.0 2. Inability to urinate 3. Continued bleeding from incision. 4. Increased pain, redness, or drainage from the incision. 5. Increasing abdominal pain  The clinic staff is available to answer your questions during regular business hours.  Please don't hesitate to call and ask to speak to one of the nurses for clinical concerns.  If you have a medical emergency, go to the nearest emergency room or call 911.  A surgeon from [facility] is always on call at the hospital. [address], [address], [place], [place]  [address] ? [address] [address], [place],    [address] [phone] ? [phone] ? [phone]   [phone] Web site: [email or web address]  

## 2011-10-05 NOTE — Discharge Summary (Signed)
Physician Discharge Summary  Patient ID: HORACE WISHON MRN: [id number] DOB/AGE: [birth year] 46 y.o.  Admit date: 10/02/2011 Discharge date: 10/05/2011  Admission Diagnoses: Symptomatic cholelithiasis, possible choledocholithiasis  Discharge Diagnoses:  S/p lap chole with cholangiogram  Discharged Condition: good  Hospital Course: 46 yof with history of biliary colic who was planning on lap chole after school finished.  She developed acute episode with mild elevation of lfts.  These were normalizing.  She underwent lap chole with cholangiogram.  Had some slow filling of duodenum which I think was at sphincter and not a stone.  LFTs continue to improve day after surgery.  She will be discharged and follow up with me in office.  Consults: None  Significant Diagnostic Studies: none  Treatments: surgery: laparoscopic cholecystectomy with cholangiogram  Discharge Exam: Blood pressure 115/70, pulse 97, temperature 98.8 F (37.1 C), temperature source Oral, resp. rate 18, height 4\' 11"  (1.499 m), weight 206 lb (93.441 kg), SpO2 99.00%. Incision/Wound:clean without infection  Disposition: Home  Medication List  As of 10/05/2011  8:34 AM   TAKE these medications         buPROPion 300 MG 24 hr tablet   Commonly known as: WELLBUTRIN XL   Take 300 mg by mouth daily.      drospirenone-ethinyl estradiol 3-0.02 MG tablet   Commonly known as: YAZ,GIANVI,LORYNA   Take 1 tablet by mouth daily.      hydrochlorothiazide 12.5 MG tablet   Commonly known as: HYDRODIURIL   Take 12.5 mg by mouth daily as needed. swelling      oxyCODONE 5 MG immediate release tablet   Commonly known as: Oxy IR/ROXICODONE   Take 1 tablet (5 mg total) by mouth every 4 (four) hours as needed for pain.           Follow-up Information    Follow up with [facility], [place]. Schedule an appointment as soon as possible for a visit in 3 weeks.   Contact information:   [employer] [address] [place] [address] [phone]          Signed: Emelia Loron 10/05/2011, 8:34 AM

## 2011-10-07 ENCOUNTER — Encounter (HOSPITAL_COMMUNITY): Payer: Self-pay | Admitting: General Surgery

## 2011-11-03 ENCOUNTER — Encounter (INDEPENDENT_AMBULATORY_CARE_PROVIDER_SITE_OTHER): Payer: BC Managed Care – PPO | Admitting: General Surgery

## 2011-11-25 ENCOUNTER — Encounter (INDEPENDENT_AMBULATORY_CARE_PROVIDER_SITE_OTHER): Payer: Self-pay | Admitting: General Surgery

## 2011-11-25 ENCOUNTER — Encounter (INDEPENDENT_AMBULATORY_CARE_PROVIDER_SITE_OTHER): Payer: BC Managed Care – PPO | Admitting: General Surgery

## 2011-11-25 ENCOUNTER — Ambulatory Visit (INDEPENDENT_AMBULATORY_CARE_PROVIDER_SITE_OTHER): Payer: BC Managed Care – PPO | Admitting: General Surgery

## 2011-11-25 VITALS — BP 116/80 | HR 82 | Resp 16 | Ht 60.0 in | Wt 211.0 lb

## 2011-11-25 DIAGNOSIS — Z09 Encounter for follow-up examination after completed treatment for conditions other than malignant neoplasm: Secondary | ICD-10-CM

## 2011-11-25 NOTE — Progress Notes (Signed)
Subjective:     Patient ID: Carla Patrick, female   DOB: [birth year], 46 y.o.   MRN: [id number]  HPI 46 yof who underwent lap chole with nl cholangiogram about a month ago. She is doing well without complaints.  Path shows cholelithiasis.  Review of Systems     Objective:   Physical Exam Healing incisions without infection     Assessment:     S/p lap chole    Plan:     Return to full activity and regular diet Return to see me as needed

## 2013-07-15 ENCOUNTER — Encounter: Payer: Self-pay | Admitting: Cardiovascular Disease

## 2013-07-15 ENCOUNTER — Ambulatory Visit (INDEPENDENT_AMBULATORY_CARE_PROVIDER_SITE_OTHER): Payer: BC Managed Care – PPO | Admitting: Cardiovascular Disease

## 2013-07-15 VITALS — BP 164/110 | HR 96 | Ht 58.5 in | Wt 209.0 lb

## 2013-07-15 DIAGNOSIS — R9431 Abnormal electrocardiogram [ECG] [EKG]: Secondary | ICD-10-CM | POA: Insufficient documentation

## 2013-07-15 NOTE — Progress Notes (Signed)
     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 She was referred through the courtesy of Dr. Deland Patrick because of an abnormal EKG performed in his office. Apparently it had shown a change compared to prior EKGs. Her cardiovascular factor profile is entirely benign. She does smoke bushy diabetic. There is no family history of heart disease. She's never had a heart attack or stroke. She denies chest pain but does get some shortness of breath and has a history of reactive airways disease   Current Outpatient Prescriptions  Medication Sig Dispense Refill  . buPROPion (WELLBUTRIN XL) 300 MG 24 hr tablet Take 300 mg by mouth daily.      . drospirenone-ethinyl estradiol (YAZ,GIANVI,LORYNA) 3-0.02 MG tablet Take 1 tablet by mouth daily.      . hydrochlorothiazide (HYDRODIURIL) 12.5 MG tablet Take 12.5 mg by mouth daily as needed. swelling       No current facility-administered medications for this visit.    No Known Allergies  History   Social History  . Marital Status: Single    Spouse Name: N/A    Number of Children: N/A  . Years of Education: N/A   Occupational History  . Not on file.   Social History Main Topics  . Smoking status: Never Smoker   . Smokeless tobacco: Never Used  . Alcohol Use: Yes     Comment: occasional glass of wine  . Drug Use: No  . Sexual Activity: Not Currently   Other Topics Concern  . Not on file   Social History Narrative  . No narrative on file     Review of Systems: General: negative for chills, fever, night sweats or weight changes.  Cardiovascular: negative for chest pain, dyspnea on exertion, edema, orthopnea, palpitations, paroxysmal nocturnal  dyspnea or shortness of breath Dermatological: negative for rash Respiratory: negative for cough or wheezing Urologic: negative for hematuria Abdominal: negative for nausea, vomiting, diarrhea, bright red blood per rectum, melena, or hematemesis Neurologic: negative for visual changes, syncope, or dizziness All other systems reviewed and are otherwise negative except as noted above.    Blood pressure 164/110, pulse 96, height 4' 10.5" (1.486 m), weight 209 lb (94.802 kg).  General appearance: alert and no distress Neck: no adenopathy, no carotid bruit, no JVD, supple, symmetrical, trachea midline and thyroid not enlarged, symmetric, no tenderness/mass/nodules Lungs: clear to auscultation bilaterally Heart: regular rate and rhythm, S1, S2 normal, no murmur, click, rub or gallop Extremities: extremities normal, atraumatic, no cyanosis or edema  EKG normal sinus rhythm at 96 with left axis deviation  ASSESSMENT AND PLAN:   Abnormal EKG Patient is a delightful 48 year old severely overweight single African American female no children he works as a professor of fever and recurrent central. She essentially has no cardiac risk factors and was referred to me by Dr. Concha Patrick for evaluation of an abnormal EKG. Unfortunately I do not have a copy of that  her EKG although the EKG performed today is fairly unremarkable with left axis deviation.      Carla Harp MD [place], [facility] 07/15/2013 3:38 PM Carla Harp MD FACP,FACC,FAHA, [facility]

## 2013-07-15 NOTE — Assessment & Plan Note (Signed)
Patient is a delightful 48 year old severely overweight single African American female no children he works as a professor of fever and recurrent central. She essentially has no cardiac risk factors and was referred to me by Dr. Concha Pyo for evaluation of an abnormal EKG. Unfortunately I do not have a copy of that  her EKG although the EKG performed today is fairly unremarkable with left axis deviation.

## 2013-07-15 NOTE — Patient Instructions (Signed)
Follow up with Dr Berry as needed.  

## 2013-08-02 ENCOUNTER — Encounter: Payer: Self-pay | Admitting: *Deleted

## 2013-08-11 ENCOUNTER — Encounter: Payer: Self-pay | Admitting: Cardiovascular Disease

## 2013-11-03 ENCOUNTER — Ambulatory Visit (INDEPENDENT_AMBULATORY_CARE_PROVIDER_SITE_OTHER): Payer: BC Managed Care – PPO | Admitting: Family Medicine

## 2013-11-03 ENCOUNTER — Encounter: Payer: Self-pay | Admitting: Family Medicine

## 2013-11-03 VITALS — BP 157/97 | HR 99 | Ht 60.0 in | Wt 208.0 lb

## 2013-11-03 DIAGNOSIS — M25569 Pain in unspecified knee: Secondary | ICD-10-CM

## 2013-11-03 DIAGNOSIS — M25561 Pain in right knee: Secondary | ICD-10-CM

## 2013-11-03 MED ORDER — METHYLPREDNISOLONE ACETATE 40 MG/ML IJ SUSP
40.0000 mg | Freq: Once | INTRAMUSCULAR | Status: AC
  Administered 2013-11-03: 40 mg via INTRA_ARTICULAR

## 2013-11-03 NOTE — Patient Instructions (Signed)
Your knee pain is likely due to arthritis. Take tylenol 500mg  1-2 tabs three times a day for pain. Aleve 1-2 tabs twice a day with food OR ibuprofen 600mg  three times a day with food as needed for pain, inflammation. Glucosamine sulfate 750mg  twice a day is a supplement that may help. Capsaicin topically up to four times a day may also help with pain. Cortisone injections are an option - you were given one of these today. If cortisone injections do not help, there are different types of shots that may help but they take longer to take effect. It's important that you continue to stay active. If you are overweight, try to lose weight through diet and exercise. Straight leg raises, knee extensions 3 sets of 10 once a day (add ankle weight if these become too easy). Consider physical therapy to strengthen muscles around the joint that hurts to take pressure off of the joint itself. Shoe inserts with good arch support may be helpful (like Dr. Zoe Lan active series insoles). Walker or cane if needed. Heat or ice 15 minutes at a time 3-4 times a day as needed to help with pain. Water aerobics and cycling with low resistance are the best two types of exercise for arthritis. Have fun in [place]! Follow up with me as needed.

## 2013-11-08 ENCOUNTER — Encounter: Payer: Self-pay | Admitting: Family Medicine

## 2013-11-08 DIAGNOSIS — M25561 Pain in right knee: Secondary | ICD-10-CM | POA: Insufficient documentation

## 2013-11-08 NOTE — Progress Notes (Signed)
Patient ID: Carla Patrick, female   DOB: Apr 05, 1966, 48 y.o.   MRN: [id number]  PCP: Horatio Pel, MD  Subjective:   HPI: Patient is a 48 y.o. female here for right knee pain.  Patient reports she's had right knee pain fairly consistently since February. No known injury or trauma. + swelling. Tried ibuprofen, meloxicam. Worse at night. Feels unstable. No catching, locking. Radiographs showed mild arthritis. Hurts to walk a lot and stretch out her leg.  Past Medical History  Diagnosis Date  . Anemia   . Asthma   . Heart murmur   . Hyperlipidemia   . Abnormal EKG     Current Outpatient Prescriptions on File Prior to Visit  Medication Sig Dispense Refill  . buPROPion (WELLBUTRIN XL) 300 MG 24 hr tablet Take 300 mg by mouth daily.      . drospirenone-ethinyl estradiol (YAZ,GIANVI,LORYNA) 3-0.02 MG tablet Take 1 tablet by mouth daily.      . hydrochlorothiazide (HYDRODIURIL) 12.5 MG tablet Take 12.5 mg by mouth daily as needed. swelling       No current facility-administered medications on file prior to visit.    Past Surgical History  Procedure Laterality Date  . Breast excisional biopsy  2007    left  . Cholecystectomy  10/04/2011    Procedure: LAPAROSCOPIC CHOLECYSTECTOMY WITH INTRAOPERATIVE CHOLANGIOGRAM;  Surgeon: Rolm Bookbinder, MD;  Location: [facility];  Service: General;  Laterality: N/A;    No Known Allergies  History   Social History  . Marital Status: Single    Spouse Name: N/A    Number of Children: N/A  . Years of Education: N/A   Occupational History  . Not on file.   Social History Main Topics  . Smoking status: Never Smoker   . Smokeless tobacco: Never Used  . Alcohol Use: Yes     Comment: occasional glass of wine  . Drug Use: No  . Sexual Activity: Not Currently   Other Topics Concern  . Not on file   Social History Narrative  . No narrative on file    Family History  Problem Relation Age of Onset  . Heart disease  Maternal Grandmother   . Diabetes Maternal Grandfather   . Diabetes Paternal Grandmother   . HIV Father     BP 157/97  Pulse 99  Ht 5' (1.524 m)  Wt 208 lb (94.348 kg)  BMI 40.62 kg/m2  Review of Systems: See HPI above.    Objective:  Physical Exam:  Gen: NAD  Right knee: Mild effusion.  No other deformity, bruising. TTP medial joint line, less post patellar facets. FROM. Negative ant/post drawers. Negative valgus/varus testing. Negative lachmanns. Negative mcmurrays, apleys, patellar apprehension. NV intact distally.    Assessment & Plan:  1. Right knee pain - 2/2 arthritis, less likely degenerative meniscal tear.  Discussed tylenol, nsaids, glucosamine, capsaicin.  Cortisone injection given today.  Home exercise program reviewed also.  Heat or ice as needed.  F/u prn.  After informed written consent, patient was lying supine on exam table. Right knee was prepped with alcohol swab and utilizing superolateral approach under ultrasound guidance, patient's right knee was injected intraarticularly with 3:1 marcaine: depomedrol. Patient tolerated the procedure well without immediate complications.

## 2013-11-08 NOTE — Assessment & Plan Note (Signed)
2/2 arthritis, less likely degenerative meniscal tear.  Discussed tylenol, nsaids, glucosamine, capsaicin.  Cortisone injection given today.  Home exercise program reviewed also.  Heat or ice as needed.  F/u prn.  After informed written consent, patient was lying supine on exam table. Right knee was prepped with alcohol swab and utilizing superolateral approach under ultrasound guidance, patient's right knee was injected intraarticularly with 3:1 marcaine: depomedrol. Patient tolerated the procedure well without immediate complications.

## 2013-11-11 ENCOUNTER — Ambulatory Visit: Payer: BC Managed Care – PPO | Admitting: Family Medicine

## 2014-06-09 ENCOUNTER — Encounter: Payer: Self-pay | Admitting: Family Medicine

## 2014-06-09 ENCOUNTER — Ambulatory Visit (HOSPITAL_BASED_OUTPATIENT_CLINIC_OR_DEPARTMENT_OTHER)
Admission: RE | Admit: 2014-06-09 | Discharge: 2014-06-09 | Disposition: A | Discharge status: Home / Self Care | Payer: BC Managed Care – PPO | Source: Ambulatory Visit | Attending: Family Medicine | Admitting: Family Medicine

## 2014-06-09 ENCOUNTER — Ambulatory Visit (INDEPENDENT_AMBULATORY_CARE_PROVIDER_SITE_OTHER): Payer: BC Managed Care – PPO | Admitting: Family Medicine

## 2014-06-09 VITALS — BP 155/97 | HR 84 | Ht 60.0 in | Wt 203.0 lb

## 2014-06-09 DIAGNOSIS — G8929 Other chronic pain: Secondary | ICD-10-CM | POA: Insufficient documentation

## 2014-06-09 DIAGNOSIS — M25561 Pain in right knee: Secondary | ICD-10-CM | POA: Diagnosis not present

## 2014-06-09 NOTE — Patient Instructions (Signed)
We will go ahead with an MRI of your knee. I will call you the business day following this to go over results and next steps.

## 2014-06-10 ENCOUNTER — Ambulatory Visit (HOSPITAL_BASED_OUTPATIENT_CLINIC_OR_DEPARTMENT_OTHER): Payer: BC Managed Care – PPO

## 2014-06-14 NOTE — Progress Notes (Signed)
Patient ID: Carla Patrick, female   DOB: 08-21-1965, 49 y.o.   MRN: [id number]  PCP: Horatio Pel, MD  Subjective:   HPI: Patient is a 49 y.o. female here for right knee pain.  11/03/13: Patient reports she's had right knee pain fairly consistently since February. No known injury or trauma. + swelling. Tried ibuprofen, meloxicam. Worse at night. Feels unstable. No catching, locking. Radiographs showed mild arthritis. Hurts to walk a lot and stretch out her leg.  06/09/14: Patient reports shot in knee helped her only for a week. She did walk a lot in [place] following this. Taking tylenol, pain medication as needed. Not limping as much as she was previously. Feels like knee will buckle at times with stairs. No catching or locking.  Past Medical History  Diagnosis Date  . Anemia   . Asthma   . Heart murmur   . Hyperlipidemia   . Abnormal EKG     Current Outpatient Prescriptions on File Prior to Visit  Medication Sig Dispense Refill  . buPROPion (WELLBUTRIN XL) 300 MG 24 hr tablet Take 300 mg by mouth daily.    . drospirenone-ethinyl estradiol (YAZ,GIANVI,LORYNA) 3-0.02 MG tablet Take 1 tablet by mouth daily.    . hydrochlorothiazide (HYDRODIURIL) 12.5 MG tablet Take 12.5 mg by mouth daily as needed. swelling    . Lorcaserin HCl (BELVIQ) 10 MG TABS Take by mouth.    Marland Kitchen PROAIR HFA 108 (90 BASE) MCG/ACT inhaler      No current facility-administered medications on file prior to visit.    Past Surgical History  Procedure Laterality Date  . Breast excisional biopsy  2007    left  . Cholecystectomy  10/04/2011    Procedure: LAPAROSCOPIC CHOLECYSTECTOMY WITH INTRAOPERATIVE CHOLANGIOGRAM;  Surgeon: Rolm Bookbinder, MD;  Location: [facility];  Service: General;  Laterality: N/A;    No Known Allergies  History   Social History  . Marital Status: Single    Spouse Name: N/A    Number of Children: N/A  . Years of Education: N/A   Occupational History  . Not on file.    Social History Main Topics  . Smoking status: Never Smoker   . Smokeless tobacco: Never Used  . Alcohol Use: 0.0 oz/week    0 Not specified per week     Comment: occasional glass of wine  . Drug Use: No  . Sexual Activity: Not Currently   Other Topics Concern  . Not on file   Social History Narrative    Family History  Problem Relation Age of Onset  . Heart disease Maternal Grandmother   . Diabetes Maternal Grandfather   . Diabetes Paternal Grandmother   . HIV Father     BP 155/97 mmHg  Pulse 84  Ht 5' (1.524 m)  Wt 203 lb (92.08 kg)  BMI 39.65 kg/m2  Review of Systems: See HPI above.    Objective:  Physical Exam:  Gen: NAD  Right knee: Mild effusion.  No other deformity, bruising. TTP medial joint line. FROM. Negative ant/post drawers. Negative valgus/varus testing. Negative lachmanns. Mild pain with mcmurrays, apleys.  Negative patellar apprehension. NV intact distally.    Assessment & Plan:  1. Right knee pain - Only had about a week of relief with cortisone injection, home exercises.  Taking tylenol as needed.  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  Will go ahead with an  MRI to assess for medial meniscus tear.

## 2014-06-14 NOTE — Assessment & Plan Note (Signed)
Only had about a week of relief with cortisone injection, home exercises.  Taking tylenol as needed.  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  Will go ahead with an MRI to assess for medial meniscus tear.

## 2014-06-17 ENCOUNTER — Ambulatory Visit (HOSPITAL_BASED_OUTPATIENT_CLINIC_OR_DEPARTMENT_OTHER): Payer: BC Managed Care – PPO

## 2019-08-06 ENCOUNTER — Ambulatory Visit: Payer: BC Managed Care – PPO | Attending: Internal Medicine

## 2019-08-06 DIAGNOSIS — Z23 Encounter for immunization: Secondary | ICD-10-CM | POA: Insufficient documentation

## 2019-08-06 NOTE — Progress Notes (Signed)
   Covid-19 Vaccination Clinic  Name:  AZALEIA ARRELLANO    MRN: [id number] DOB: May 29, 1966  08/06/2019  Ms. Preast was observed post Covid-19 immunization for 15 minutes without incident. She was provided with Vaccine Information Sheet and instruction to access the V-Safe system.   Ms. Quirindongo was instructed to call 911 with any severe reactions post vaccine: Marland Kitchen Difficulty breathing  . Swelling of face and throat  . A fast heartbeat  . A bad rash all over body  . Dizziness and weakness   Immunizations Administered    Name Date Dose VIS Date Route   Pfizer COVID-19 Vaccine 08/06/2019  3:16 PM 0.3 mL 05/13/2019 Intramuscular   Manufacturer: [place]   Lot: [id number]   [place]: [id number]

## 2019-08-27 ENCOUNTER — Ambulatory Visit: Payer: BC Managed Care – PPO | Attending: Internal Medicine

## 2019-08-27 DIAGNOSIS — Z23 Encounter for immunization: Secondary | ICD-10-CM

## 2019-08-27 NOTE — Progress Notes (Signed)
   Covid-19 Vaccination Clinic  Name:  ALEKSIA DRAVIS    MRN: [id number] DOB: Oct 31, 1965  08/27/2019  Ms. Mehrhoff was observed post Covid-19 immunization for 15 minutes without incident. She was provided with Vaccine Information Sheet and instruction to access the V-Safe system.   Ms. Sarnowski was instructed to call 911 with any severe reactions post vaccine: Marland Kitchen Difficulty breathing  . Swelling of face and throat  . A fast heartbeat  . A bad rash all over body  . Dizziness and weakness   Immunizations Administered    Name Date Dose VIS Date Route   Pfizer COVID-19 Vaccine 08/27/2019  4:08 PM 0.3 mL 05/13/2019 Intramuscular   Manufacturer: [place]   Lot: [id number]   [place]: [id number]

## 2020-02-16 ENCOUNTER — Other Ambulatory Visit (HOSPITAL_COMMUNITY): Payer: Self-pay

## 2020-02-16 MED FILL — WEGOVY 0.5 MG/0.5ML SOAJ: 0.5 | 28 days supply | Qty: 2 | Fill #0

## 2020-02-29 ENCOUNTER — Ambulatory Visit
Admission: EM | Admit: 2020-02-29 | Discharge: 2020-02-29 | Disposition: A | Discharge status: Home / Self Care | Payer: BC Managed Care – PPO

## 2020-02-29 ENCOUNTER — Encounter: Payer: Self-pay | Admitting: *Deleted

## 2020-02-29 ENCOUNTER — Other Ambulatory Visit: Payer: Self-pay

## 2020-02-29 DIAGNOSIS — M25562 Pain in left knee: Secondary | ICD-10-CM

## 2020-02-29 MED ORDER — KETOROLAC TROMETHAMINE 30 MG/ML IJ SOLN
30.0000 mg | Freq: Once | INTRAMUSCULAR | Status: AC
  Administered 2020-02-29: 30 mg via INTRAMUSCULAR

## 2020-02-29 NOTE — Discharge Instructions (Addendum)
RICE: rest, ice, compression, elevation as needed for pain.    Heat therapy (hot compress, warm wash rag, hot showers, etc.) can help relax muscles and soothe muscle aches. Cold therapy (ice packs) can be used to help swelling both after injury and after prolonged use of areas of chronic pain/aches.  Pain medication:  350 mg-1000 mg of Tylenol (acetaminophen) and/or 200 mg - 800 mg of Advil (ibuprofen, Motrin) every 8 hours as needed.  May alternate between the two throughout the day as they are generally safe to take together.  DO NOT exceed more than 3000 mg of Tylenol or 3200 mg of ibuprofen in a 24 hour period as this could damage your stomach, kidneys, liver, or increase your bleeding risk.   Important to follow up with specialist(s) below for further evaluation/management if your symptoms persist or worsen. 

## 2020-02-29 NOTE — ED Provider Notes (Signed)
[facility]    CSN: [id number] Arrival date & time: 02/29/20  1159      History   Chief Complaint Chief Complaint  Patient presents with  . Knee Pain    HPI Carla Patrick is a 54 y.o. female  Left knee pain since Friday.  Denies injury, inciting event.  Does endorse a cracking sensation at times.  Is able to bear weight, though this worsens pain.  Has been using ibuprofen, arthritis cream, heat and ice at home with some relief.  Overall feels it is improved.  Has also been using Ace wraps.  Has not been followed by specialist, though does endorse history of pain in affected knee in the past.  Has had steroid injections which she has tolerated well.  Past Medical History:  Diagnosis Date  . Abnormal EKG   . Anemia   . Asthma   . Heart murmur   . Hyperlipidemia     Patient Active Problem List   Diagnosis Date Noted  . Right knee pain 11/08/2013  . Abnormal EKG 07/15/2013    Past Surgical History:  Procedure Laterality Date  . BREAST EXCISIONAL BIOPSY  2007   left  . CHOLECYSTECTOMY  10/04/2011   Procedure: LAPAROSCOPIC CHOLECYSTECTOMY WITH INTRAOPERATIVE CHOLANGIOGRAM;  Surgeon: Rolm Bookbinder, MD;  Location: [facility];  Service: General;  Laterality: N/A;    OB History   No obstetric history on file.      Home Medications    Prior to Admission medications   Medication Sig Start Date End Date Taking? Authorizing Provider  buPROPion (WELLBUTRIN XL) 300 MG 24 hr tablet Take 300 mg by mouth daily.   Yes [provider]  hydrochlorothiazide (HYDRODIURIL) 12.5 MG tablet Take 12.5 mg by mouth daily as needed. swelling   Yes [provider]  PROAIR HFA 108 (90 BASE) MCG/ACT inhaler  11/02/13  Yes [provider]  rosuvastatin (CRESTOR) 20 MG tablet Take 20 mg by mouth daily. 12/06/19  Yes [provider]  Semaglutide-Weight Management (WEGOVY) 0.5 MG/0.5ML SOAJ Inject 0.5 mg into the skin.   Yes [provider]    SYMBICORT 160-4.5 MCG/ACT inhaler SMARTSIG:2 Puff(s) By Mouth Twice Daily PRN 02/16/20  Yes [provider]  telmisartan (MICARDIS) 80 MG tablet Take 80 mg by mouth daily. 12/06/19  Yes [provider]  betamethasone, augmented, (DIPROLENE) 0.05 % lotion SMARTSIG:Sparingly Topical As Directed 12/20/19   [provider]  drospirenone-ethinyl estradiol (YAZ,GIANVI,LORYNA) 3-0.02 MG tablet Take 1 tablet by mouth daily.    [provider]  Lorcaserin HCl (BELVIQ) 10 MG TABS Take by mouth.    [provider]    Family History Family History  Problem Relation Age of Onset  . HIV Father   . Heart disease Maternal Grandmother   . Diabetes Maternal Grandfather   . Diabetes Paternal Grandmother     Social History Social History   Tobacco Use  . Smoking status: Never Smoker  . Smokeless tobacco: Never Used  Vaping Use  . Vaping Use: Never used  Substance Use Topics  . Alcohol use: Yes    Alcohol/week: 0.0 standard drinks    Comment: occasional glass of wine  . Drug use: No     Allergies   Patient has no known allergies.   Review of Systems As per HPI   Physical Exam Triage Vital Signs ED Triage Vitals  Enc Vitals Group     BP      Pulse  Resp      Temp      Temp src      SpO2      Weight      Height      Head Circumference      Peak Flow      Pain Score      Pain Loc      Pain Edu?      Excl. in [place]?    No data found.  Updated Vital Signs BP 116/79 (BP Location: Left Arm)   Pulse 91   Temp 98.5 F (36.9 C) (Oral)   Resp 20   LMP 01/01/2020   SpO2 97%   Visual Acuity Right Eye Distance:   Left Eye Distance:   Bilateral Distance:    Right Eye Near:   Left Eye Near:    Bilateral Near:     Physical Exam Constitutional:      General: She is not in acute distress. HENT:     Head: Normocephalic and atraumatic.  Eyes:     General: No scleral icterus.    Pupils: Pupils are equal, round, and reactive to  light.  Cardiovascular:     Rate and Rhythm: Normal rate.  Pulmonary:     Effort: Pulmonary effort is normal.  Musculoskeletal:        General: Tenderness present. No swelling. Normal range of motion.     Comments: Mild anterior knee pain that spares patella.  No bony joint tenderness.  Minimal crepitus with active ROM.  Gait mild antalgic, favoring left.  Neurovascular intact.  Negative anterior drawer test.  Skin:    Coloration: Skin is not jaundiced or pale.  Neurological:     Mental Status: She is alert and oriented to person, place, and time.      UC Treatments / Results  Labs (all labs ordered are listed, but only abnormal results are displayed) Labs Reviewed - No data to display  EKG   Radiology No results found.  Procedures Procedures (including critical care time)  Medications Ordered in UC Medications  ketorolac (TORADOL) 30 MG/ML injection 30 mg (30 mg Intramuscular Given 02/29/20 1251)    Initial Impression / Assessment and Plan / UC Course  I have reviewed the triage vital signs and the nursing notes.  Pertinent labs & imaging results that were available during my care of the patient were reviewed by me and considered in my medical decision making (see chart for details).     Acute on chronic.  Knee joint stable and without significant effusion.  X-ray deferred.  Will continue supportive care, follow-up with orthopedics for further evaluation and management.  Given Toradol in office which he tolerated well. Final Clinical Impressions(s) / UC Diagnoses   Final diagnoses:  Acute pain of left knee     Discharge Instructions     RICE: rest, ice, compression, elevation as needed for pain.    Heat therapy (hot compress, warm wash rag, hot showers, etc.) can help relax muscles and soothe muscle aches. Cold therapy (ice packs) can be used to help swelling both after injury and after prolonged use of areas of chronic pain/aches.  Pain medication:  350  mg-1000 mg of Tylenol (acetaminophen) and/or 200 mg - 800 mg of Advil (ibuprofen, Motrin) every 8 hours as needed.  May alternate between the two throughout the day as they are generally safe to take together.  DO NOT exceed more than 3000 mg of Tylenol or 3200 mg of ibuprofen in  a 24 hour period as this could damage your stomach, kidneys, liver, or increase your bleeding risk.  Important to follow up with specialist(s) below for further evaluation/management if your symptoms persist or worsen.     ED Prescriptions    None     PDMP not reviewed this encounter.   [place], [place], [place] 02/29/20 1308

## 2020-02-29 NOTE — ED Triage Notes (Signed)
Patient in with complaints of left knee pain since Friday. Patient denies any recent injury. Swelling noted to left knee. Patient is able to ambulate but is walking with a limp. Patient has been using, ibuprofen, arthrititis cream, heat and ice at home.

## 2020-03-08 MED FILL — WEGOVY 1 MG/0.5ML SOAJ: 1 | 28 days supply | Qty: 2 | Fill #0

## 2020-05-08 ENCOUNTER — Other Ambulatory Visit (HOSPITAL_COMMUNITY): Payer: Self-pay | Admitting: Internal Medicine

## 2020-05-08 MED FILL — WEGOVY 1 MG/0.5ML SOAJ: 1 | 28 days supply | Qty: 2 | Fill #0

## 2020-07-03 ENCOUNTER — Other Ambulatory Visit (HOSPITAL_COMMUNITY): Payer: Self-pay | Admitting: Internal Medicine

## 2020-07-03 MED FILL — WEGOVY 1.7 MG/0.75ML SOAJ: 1.7 | 28 days supply | Qty: 3 | Fill #0

## 2020-07-04 ENCOUNTER — Other Ambulatory Visit (HOSPITAL_COMMUNITY): Payer: Self-pay | Admitting: Internal Medicine

## 2020-08-07 ENCOUNTER — Other Ambulatory Visit (HOSPITAL_COMMUNITY): Payer: Self-pay | Admitting: Internal Medicine

## 2020-08-07 MED FILL — WEGOVY 2.4 MG/0.75ML SOAJ: 2.4 | 28 days supply | Qty: 3 | Fill #0

## 2020-08-09 ENCOUNTER — Other Ambulatory Visit: Payer: Self-pay | Admitting: Internal Medicine

## 2020-08-09 DIAGNOSIS — R918 Other nonspecific abnormal finding of lung field: Secondary | ICD-10-CM

## 2020-08-16 LAB — COLOGUARD: COLOGUARD: NEGATIVE

## 2020-09-12 ENCOUNTER — Other Ambulatory Visit (HOSPITAL_COMMUNITY): Payer: Self-pay

## 2020-09-12 MED ORDER — WEGOVY 2.4 MG/0.75ML ~~LOC~~ SOAJ
2.4000 mg | SUBCUTANEOUS | 3 refills | Status: DC
  Filled 2020-09-12: qty 3, 28d supply, fill #0
  Filled 2020-11-02 – 2020-11-05 (×2): qty 3, 28d supply, fill #1
  Filled 2020-12-07: qty 3, 28d supply, fill #2

## 2020-09-21 ENCOUNTER — Other Ambulatory Visit (HOSPITAL_COMMUNITY): Payer: Self-pay

## 2020-11-02 ENCOUNTER — Other Ambulatory Visit (HOSPITAL_COMMUNITY): Payer: Self-pay

## 2020-11-05 ENCOUNTER — Other Ambulatory Visit (HOSPITAL_COMMUNITY): Payer: Self-pay

## 2020-11-06 ENCOUNTER — Other Ambulatory Visit (HOSPITAL_COMMUNITY): Payer: Self-pay

## 2020-11-08 ENCOUNTER — Other Ambulatory Visit (HOSPITAL_COMMUNITY): Payer: Self-pay

## 2020-11-30 DIAGNOSIS — C801 Malignant (primary) neoplasm, unspecified: Secondary | ICD-10-CM

## 2020-11-30 HISTORY — DX: Malignant (primary) neoplasm, unspecified: C80.1

## 2020-12-07 ENCOUNTER — Other Ambulatory Visit (HOSPITAL_COMMUNITY): Payer: Self-pay

## 2020-12-20 ENCOUNTER — Encounter: Payer: Self-pay | Admitting: Obstetrics and Gynecology

## 2020-12-20 ENCOUNTER — Telehealth: Payer: Self-pay | Admitting: Hematology

## 2020-12-20 NOTE — Telephone Encounter (Signed)
Spoke to patient to confirm afternoon clinic appointment for 7/27, packet sent by [facility]

## 2020-12-24 ENCOUNTER — Other Ambulatory Visit: Payer: Self-pay | Admitting: *Deleted

## 2020-12-24 ENCOUNTER — Encounter: Payer: Self-pay | Admitting: *Deleted

## 2020-12-24 DIAGNOSIS — C50411 Malignant neoplasm of upper-outer quadrant of right female breast: Secondary | ICD-10-CM | POA: Insufficient documentation

## 2020-12-24 DIAGNOSIS — Z170 Estrogen receptor positive status [ER+]: Secondary | ICD-10-CM | POA: Insufficient documentation

## 2020-12-26 ENCOUNTER — Encounter: Payer: Self-pay | Admitting: *Deleted

## 2020-12-26 ENCOUNTER — Ambulatory Visit
Admission: RE | Admit: 2020-12-26 | Discharge: 2020-12-26 | Disposition: A | Discharge status: Home / Self Care | Payer: BC Managed Care – PPO | Source: Ambulatory Visit | Attending: Radiation Oncology | Admitting: Radiation Oncology

## 2020-12-26 ENCOUNTER — Ambulatory Visit: Payer: BC Managed Care – PPO | Attending: General Surgery | Admitting: Physical Therapy

## 2020-12-26 ENCOUNTER — Other Ambulatory Visit: Payer: Self-pay | Admitting: *Deleted

## 2020-12-26 ENCOUNTER — Inpatient Hospital Stay: Payer: BC Managed Care – PPO

## 2020-12-26 ENCOUNTER — Encounter: Payer: Self-pay | Admitting: Physical Therapy

## 2020-12-26 ENCOUNTER — Other Ambulatory Visit: Payer: Self-pay

## 2020-12-26 ENCOUNTER — Inpatient Hospital Stay: Payer: BC Managed Care – PPO | Attending: Hematology | Admitting: Hematology

## 2020-12-26 ENCOUNTER — Ambulatory Visit: Payer: Self-pay | Admitting: General Surgery

## 2020-12-26 ENCOUNTER — Encounter: Payer: Self-pay | Admitting: Hematology

## 2020-12-26 ENCOUNTER — Encounter: Payer: Self-pay | Admitting: General Practice

## 2020-12-26 VITALS — BP 127/89 | HR 103 | Temp 97.2°F | Resp 20 | Ht 59.5 in | Wt 191.0 lb

## 2020-12-26 DIAGNOSIS — C50411 Malignant neoplasm of upper-outer quadrant of right female breast: Secondary | ICD-10-CM

## 2020-12-26 DIAGNOSIS — Z170 Estrogen receptor positive status [ER+]: Secondary | ICD-10-CM | POA: Insufficient documentation

## 2020-12-26 DIAGNOSIS — R293 Abnormal posture: Secondary | ICD-10-CM | POA: Diagnosis present

## 2020-12-26 LAB — CMP (CANCER CENTER ONLY)
ALT: 23 U/L (ref 0–44)
AST: 19 U/L (ref 15–41)
Albumin: 3.9 g/dL (ref 3.5–5.0)
Alkaline Phosphatase: 81 U/L (ref 38–126)
Anion gap: 10 (ref 5–15)
BUN: 12 mg/dL (ref 6–20)
CO2: 27 mmol/L (ref 22–32)
Calcium: 9.6 mg/dL (ref 8.9–10.3)
Chloride: 103 mmol/L (ref 98–111)
Creatinine: 0.89 mg/dL (ref 0.44–1.00)
GFR, Estimated: >60 mL/min (ref >60)
Glucose, Bld: 139 mg/dL — ABNORMAL HIGH (ref 70–99)
Potassium: 3.7 mmol/L (ref 3.5–5.1)
Sodium: 140 mmol/L (ref 135–145)
Total Bilirubin: 0.8 mg/dL (ref 0.3–1.2)
Total Protein: 7.2 g/dL (ref 6.5–8.1)

## 2020-12-26 LAB — CBC WITH DIFFERENTIAL (CANCER CENTER ONLY)
Abs Immature Granulocytes: 0.02 10*3/uL (ref 0.00–0.07)
Basophils Absolute: 0.1 10*3/uL (ref 0.0–0.1)
Basophils Relative: 1 %
Eosinophils Absolute: 0.1 10*3/uL (ref 0.0–0.5)
Eosinophils Relative: 2 %
HCT: 34.6 % — ABNORMAL LOW (ref 36.0–46.0)
Hemoglobin: 11.6 g/dL — ABNORMAL LOW (ref 12.0–15.0)
Immature Granulocytes: 0 %
Lymphocytes Relative: 43 %
Lymphs Abs: 3.2 10*3/uL (ref 0.7–4.0)
MCH: 32.2 pg (ref 26.0–34.0)
MCHC: 33.5 g/dL (ref 30.0–36.0)
MCV: 96.1 fL (ref 80.0–100.0)
Monocytes Absolute: 0.4 10*3/uL (ref 0.1–1.0)
Monocytes Relative: 5 %
Neutro Abs: 3.7 10*3/uL (ref 1.7–7.7)
Neutrophils Relative %: 49 %
Platelet Count: 353 10*3/uL (ref 150–400)
RBC: 3.6 MIL/uL — ABNORMAL LOW (ref 3.87–5.11)
RDW: 12.5 % (ref 11.5–15.5)
WBC Count: 7.5 10*3/uL (ref 4.0–10.5)
nRBC: 0 % (ref 0.0–0.2)

## 2020-12-26 LAB — GENETIC SCREENING ORDER

## 2020-12-26 NOTE — Therapy (Signed)
[facility] [address] [place], [place], [address] Phone: [phone]   Fax:  [phone]  Physical Therapy Evaluation  Patient Details  Name: Carla Patrick MRN: [id number] Date of Birth: November 22, 1965 Referring Provider (PT): Dr. Chevis Pretty   Encounter Date: 12/26/2020   PT End of Session - 12/26/20 1448     Visit Number 1    Number of Visits 2    Date for PT Re-Evaluation 02/20/21    PT Start Time 1412    PT Stop Time 1434    PT Time Calculation (min) 22 min    Activity Tolerance Patient tolerated treatment well    Behavior During Therapy [facility] for tasks assessed/performed             Past Medical History:  Diagnosis Date   Abnormal EKG    Anemia    Asthma    Heart murmur    Hyperlipidemia    Hypertension     Past Surgical History:  Procedure Laterality Date   BREAST EXCISIONAL BIOPSY  2007   left   CHOLECYSTECTOMY  10/04/2011   Procedure: LAPAROSCOPIC CHOLECYSTECTOMY WITH INTRAOPERATIVE CHOLANGIOGRAM;  Surgeon: Emelia Loron, MD;  Location: [facility];  Service: General;  Laterality: N/A;    There were no vitals filed for this visit.    Subjective Assessment - 12/26/20 1438     Subjective Patient reprots she is here today to be seen by her medical team for her newly diagnosed right breast cancer.    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 It measures 1 cm and is located in the upper outer quadrant. It is ER positive, PR negative, andHER2 negative with a Ki67 of 1%.    Currently in Pain? No/denies   Reports intermittent tingling in fingers               [facility] PT Assessment - 12/26/20 0001       Assessment   Medical Diagnosis Right breast cancer    Referring Provider (PT) Dr. Chevis Pretty    Onset Date/Surgical Date 11/19/20    Hand Dominance Right    Prior Therapy none       Precautions   Precautions Other (comment)    Precaution Comments active cancer      Restrictions   Weight Bearing Restrictions No      Balance Screen   Has the patient fallen in the past 6 months No    Has the patient had a decrease in activity level because of a fear of falling?  No    Is the patient reluctant to leave their home because of a fear of falling?  No      Home Environment   Living Environment Private residence    Living Arrangements Spouse/significant other    Available Help at Discharge Family      Prior Function   Level of Independence Independent    Vocation Full time employment    Technical sales engineer professor at [employer] She does not exercise      Cognition   Overall Cognitive Status Within Functional Limits for tasks assessed      Posture/Postural Control   Posture/Postural Control Postural limitations    Postural Limitations Rounded Shoulders;Forward head      ROM / Strength   AROM / PROM / Strength AROM;Strength  AROM   Overall AROM Comments Cervical AROM is WNL    AROM Assessment Site Shoulder    Right/Left Shoulder Right;Left    Right Shoulder Extension 48 Degrees    Right Shoulder Flexion 163 Degrees    Right Shoulder ABduction 158 Degrees    Right Shoulder Internal Rotation 60 Degrees    Right Shoulder External Rotation 70 Degrees    Left Shoulder Extension 43 Degrees    Left Shoulder Flexion 135 Degrees    Left Shoulder ABduction 141 Degrees    Left Shoulder Internal Rotation 67 Degrees    Left Shoulder External Rotation 90 Degrees      Strength   Overall Strength Within functional limits for tasks performed               LYMPHEDEMA/ONCOLOGY QUESTIONNAIRE - 12/26/20 0001       Type   Cancer Type Right breast cancer      Lymphedema Assessments   Lymphedema Assessments Upper extremities      Right Upper Extremity Lymphedema   10 cm Proximal to Olecranon Process 37.2 cm    Olecranon  Process 26.1 cm    10 cm Proximal to Ulnar Styloid Process 24 cm    Just Proximal to Ulnar Styloid Process 15.9 cm    Across Hand at [facility] 18.2 cm    At [place] of 2nd Digit 6.5 cm      Left Upper Extremity Lymphedema   10 cm Proximal to Olecranon Process 35.8 cm    Olecranon Process 25.4 cm    10 cm Proximal to Ulnar Styloid Process 23.1 cm    Just Proximal to Ulnar Styloid Process 15.3 cm    Across Hand at [facility] 17.3 cm    At [place] of 2nd Digit 6.4 cm             L-DEX FLOWSHEETS - 12/26/20 1400       L-DEX LYMPHEDEMA SCREENING   Measurement Type Unilateral    L-DEX MEASUREMENT EXTREMITY Upper Extremity    POSITION  Standing    DOMINANT SIDE Left    At Risk Side Right    BASELINE SCORE (UNILATERAL) 13   Unknown why she measured outside normal limits            The patient was assessed using the L-Dex machine today to produce a lymphedema index baseline score. The patient will be reassessed on a regular basis (typically every 3 months) to obtain new L-Dex scores. If the score is > 6.5 points away from his/her baseline score indicating onset of subclinical lymphedema, it will be recommended to wear a compression garment for 4 weeks, 12 hours per day and then be reassessed. If the score continues to be > 6.5 points from baseline at reassessment, we will initiate lymphedema treatment. Assessing in this manner has a 95% rate of preventing clinically significant lymphedema.      Neldon Mc - 12/26/20 0001     Open a tight or new jar Mild difficulty    Do heavy household chores (wash walls, wash floors) No difficulty    Carry a shopping bag or briefcase No difficulty    Wash your back No difficulty    Use a knife to cut food No difficulty    Recreational activities in which you take some force or impact through your arm, shoulder, or hand (golf, hammering, tennis) No difficulty    During the past week, to what extent has your arm, shoulder  or hand problem  interfered with your normal social activities with family, friends, neighbors, or groups? Not at all    During the past week, to what extent has your arm, shoulder or hand problem limited your work or other regular daily activities Not at all    Arm, shoulder, or hand pain. None    Tingling (pins and needles) in your arm, shoulder, or hand Moderate    Difficulty Sleeping Mild difficulty    DASH Score 9.09 %              Objective measurements completed on examination: See above findings.          Patient was instructed today in a home exercise program today for post op shoulder range of motion. These included active assist shoulder flexion in sitting, scapular retraction, wall walking with shoulder abduction, and hands behind head external rotation.  She was encouraged to do these twice a day, holding 3 seconds and repeating 5 times when permitted by her physician.       PT Education - 12/26/20 1446     Education Details Lymphedema risk reduction and post op HEP    Person(s) Educated Patient;Spouse    Methods Explanation;Demonstration;Handout    Comprehension Returned demonstration;Verbalized understanding                 PT Long Term Goals - 12/26/20 1452       PT LONG TERM GOAL #1   Title Patient will demonstrate she has regained shoulder ROM and function post operatively compared to baselines.    Time 8    Period Weeks    Status New    Target Date 02/20/21             Breast Clinic Goals - 12/26/20 1452       Patient will be able to verbalize understanding of pertinent lymphedema risk reduction practices relevant to her diagnosis specifically related to skin care.   Time 1    Period Days    Status Achieved      Patient will be able to return demonstrate and/or verbalize understanding of the post-op home exercise program related to regaining shoulder range of motion.   Time 1    Period Days    Status Achieved      Patient will be able to  verbalize understanding of the importance of attending the postoperative After Breast Cancer Class for further lymphedema risk reduction education and therapeutic exercise.   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 It measures 1 cm and is located in the upper outer quadrant. It is ER positive, PR negative, andHER2 negative with a Ki67 of 1%. Her multidisciplinary medical team met prior to her assessments to determine a recommended treatment plan. She is planning to have a right lumpectomy and sentinel node biopsy followed by Oncotype testing, radiation, and anti-estrogen therapy. She will benefit from a post op PT reassessment and from L-Dex screens every 3 months for 2 years to detect subclinical lymphedema.    Stability/Clinical Decision Making Stable/Uncomplicated    Clinical Decision Making Low    Rehab Potential Excellent    PT Frequency --   Eval and 1 f/u visit   PT  Treatment/Interventions ADLs/Self Care Home Management;Patient/family education;Therapeutic exercise    PT Next Visit Plan Will reassess 3-4 weeks post op    PT Home Exercise Plan Post op shoulder ROM HEP    Consulted and Agree with Plan of Care Patient;Family member/caregiver    Family Member Consulted Husband             Patient will benefit from skilled therapeutic intervention in order to improve the following deficits and impairments:  Postural dysfunction, Decreased range of motion, Decreased knowledge of precautions, Impaired UE functional use, Pain  Visit Diagnosis: Malignant neoplasm of upper-outer quadrant of right breast in female, estrogen receptor positive (HCC) - Plan: PT plan of care cert/re-cert  Abnormal posture - Plan: PT plan of care cert/re-cert  Patient will follow up at outpatient cancer rehab 3-4 weeks following  surgery.  If the patient requires physical therapy at that time, a specific plan will be dictated and sent to the referring physician for approval. The patient was educated today on appropriate basic range of motion exercises to begin post operatively and the importance of attending the After Breast Cancer class following surgery.  Patient was educated today on lymphedema risk reduction practices as it pertains to recommendations that will benefit the patient immediately following surgery.  She verbalized good understanding.      Problem List Patient Active Problem List   Diagnosis Date Noted   Malignant neoplasm of upper-outer quadrant of right breast in female, estrogen receptor positive (HCC) 12/24/2020   Right knee pain 11/08/2013   Abnormal EKG 07/15/2013   Bethann Punches, PT 12/26/20 3:00 PM   [facility] [address] [place], [place], [address] Phone: [phone]   Fax:  [phone]  Name: Carla Patrick MRN: [id number] Date of Birth: Feb 17, 1966

## 2020-12-26 NOTE — Progress Notes (Addendum)
[place]   [phone] Fax:([phone]) [place] Note   Patient Care Team: Deland Pretty, MD as PCP - General (Internal Medicine) Mauro Kaufmann, RN as Oncology Nurse Navigator Rockwell Germany, RN as Oncology Nurse Navigator Jovita Kussmaul, MD as Consulting Physician (General Surgery) Truitt Merle, MD as Consulting Physician (Hematology) Gery Pray, MD as Consulting Physician (Radiation Oncology)  Date of Service:  12/26/2020   CHIEF COMPLAINTS/PURPOSE OF CONSULTATION:  Right Breast Cancer, ER+  REFERRING PHYSICIAN:  [facility]   Oncology History Overview Note  Cancer Staging Malignant neoplasm of upper-outer quadrant of right breast in female, estrogen receptor positive ([place]) Staging form: Breast, AJCC 8th Edition - Clinical stage from 12/26/2020: Stage IA (cT1b, cN0, cM0, G2, ER+, PR-, HER2-) - Unsigned Stage prefix: Initial diagnosis Histologic grading system: 3 grade system Staged by: Pathologist and managing physician Stage used in treatment planning: Yes National guidelines used in treatment planning: Yes Type of national guideline used in treatment planning: NCCN    Malignant neoplasm of upper-outer quadrant of right breast in female, estrogen receptor positive ([place])  12/05/2020 Mammogram   Right Breast Diagnostic Mammogram; Right Breast Ultrasound  IMPRESSION: The 0.4 x 0.5 x 0.7 cm taller-than-wide irregular mass in the right breast is suspicious of malignancy. No significant abnormalities were seen sonographically in the right axilla.   12/17/2020 Pathology Results   Diagnosis:  Breast, right, needle core biopsy, 12 O'CLOCK middle depth, 9 cmfn - INVASIVE MAMMARY CARCINOMA - MAMMARY CARCINOMA IN SITU  The carcinoma appears Nottingham grade 2 of 3. E-cadherin is negative in the invasive carcinoma, supporting lobular origin.  PROGNOSTIC INDICATORS Results: IMMUNOHISTOCHEMICAL AND MORPHOMETRIC ANALYSIS PERFORMED  MANUALLY The tumor cells are EQUIVOCAL for Her2 (2+). Her2 by FISH will be performed and results reported separately. Estrogen Receptor: 90%, POSITIVE, MODERATE STAINING INTENSITY Progesterone Receptor: 0%, NEGATIVE Proliferation Marker Ki67: 1%  FLUORESCENCE IN-SITU HYBRIDIZATION Results: GROUP 5: HER 2 **NEGATIVE** Equivocal form of amplification of the HER2 gene was detected in the IHC 2+ tissue sample received from this individual. HER2 FISH was performed by a technologist and cell imaging and analysis on the BioView. RATIO OF ER2/CEN17 SIGNALS 1.15 AVERAGE HER2 COPY NUMBER PER CELL 1.95   12/24/2020 Initial Diagnosis   Malignant neoplasm of upper-outer quadrant of right breast in female, estrogen receptor positive ([place])      HISTORY OF PRESENTING ILLNESS:  Carla Patrick 55 y.o. female is a here because of breast cancer. The patient was referred by [facility]. The patient presents to the clinic today accompanied by her husband.  She had routine screening mammography on 11/19/20 showing a possible abnormality in the right breast. She underwent right diagnostic mammography and right breast ultrasonography on 12/05/20 showing: 0.7 cm irregular mass in right breast at 12 o'clock; no significant right axilla abnormalities.  Biopsy on 12/17/20 showed: invasive and in situ lobular carcinoma, grade 2. Prognostic indicators significant for: estrogen receptor, 90% positive and progesterone receptor, 0% negative. Proliferation marker Ki67 at 1%. HER2 negative by FISH.   Today the patient notes she feels well, denies any pain or any other symptoms.   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   GYN HISTORY  Menarchal: ~55 years old LMP: 09/2020, irregular/about every 2 months Contraceptive: HRT:  GP:0    REVIEW OF SYSTEMS:    Constitutional: Denies fevers, chills or abnormal night sweats  Eyes:  Denies blurriness of vision, double vision or watery eyes Ears, nose, mouth, throat, and face: Denies mucositis or sore throat Respiratory: Denies cough, dyspnea or wheezes Cardiovascular: Denies palpitation, chest discomfort or lower extremity swelling Gastrointestinal:  Denies nausea, heartburn or change in bowel habits Skin: Denies abnormal skin rashes Lymphatics: Denies new lymphadenopathy or easy bruising Neurological:Denies numbness, tingling or new weaknesses Behavioral/Psych: Mood is stable, no new changes  All other systems were reviewed with the patient and are negative.   MEDICAL HISTORY:  Past Medical History:  Diagnosis Date   Abnormal EKG    Anemia    Asthma    Heart murmur    Hyperlipidemia    Hypertension     SURGICAL HISTORY: Past Surgical History:  Procedure Laterality Date   BREAST EXCISIONAL BIOPSY  2007   left   CHOLECYSTECTOMY  10/04/2011   Procedure: LAPAROSCOPIC CHOLECYSTECTOMY WITH INTRAOPERATIVE CHOLANGIOGRAM;  Surgeon: Rolm Bookbinder, MD;  Location: [facility];  Service: General;  Laterality: N/A;    SOCIAL HISTORY: Social History   Socioeconomic History   Marital status: Single    Spouse name: Not on file   Number of children: 0   Years of education: Not on file   Highest education level: Not on file  Occupational History   Not on file  Tobacco Use   Smoking status: Never   Smokeless tobacco: Never  Vaping Use   Vaping Use: Never used  Substance and Sexual Activity   Alcohol use: Yes    Alcohol/week: 0.0 standard drinks    Comment: occasional glass of wine   Drug use: No   Sexual activity: Yes    Birth control/protection: None  Other Topics Concern   Not on file  Social History Narrative   Not on file   Social Determinants of Health   Financial Resource Strain: Not on file  Food Insecurity: Not on file  Transportation Needs: Not on file  Physical Activity: Not on file  Stress: Not on file  Social Connections: Not on file   Intimate Partner Violence: Not on file    FAMILY HISTORY: Family History  Problem Relation Age of Onset   HIV Father    Heart disease Maternal Grandmother    Diabetes Maternal Grandfather    Diabetes Paternal Grandmother     ALLERGIES:  has No Known Allergies.  MEDICATIONS:  Current Outpatient Medications  Medication Sig Dispense Refill   betamethasone, augmented, (DIPROLENE) 0.05 % lotion SMARTSIG:Sparingly Topical As Directed     drospirenone-ethinyl estradiol (YAZ,GIANVI,LORYNA) 3-0.02 MG tablet Take 1 tablet by mouth daily.     hydrochlorothiazide (HYDRODIURIL) 25 MG tablet Take 12.5 mg by mouth daily.     Lorcaserin HCl (BELVIQ) 10 MG TABS Take by mouth.     Multiple Vitamins-Iron (MULTIVITAMINS WITH IRON) TABS tablet 1 tablet     PROAIR HFA 108 (90 BASE) MCG/ACT inhaler      rosuvastatin (CRESTOR) 20 MG tablet Take 20 mg by mouth daily.     Semaglutide-Weight Management 2.4 MG/0.75ML SOAJ INJECT 2.4MG SUBCUTANEOUSLY EVERY WEEK 3 mL 2   SYMBICORT 160-4.5 MCG/ACT inhaler SMARTSIG:2 Puff(s) By Mouth Twice Daily PRN     telmisartan (MICARDIS) 80 MG tablet Take 80 mg by mouth daily.     No current facility-administered medications for this visit.    PHYSICAL EXAMINATION: ECOG PERFORMANCE STATUS: 0 - Asymptomatic  Vitals:   12/26/20 1320  BP: 127/89  Pulse: (!) 103  Resp: 20  Temp: (!) 97.2 F (36.2 C)  SpO2: 100%   Filed Weights   12/26/20 1320  Weight: 191 lb (86.6 kg)    GENERAL:alert, no distress and comfortable SKIN: skin color, texture, turgor are normal, no rashes or significant lesions EYES: normal, Conjunctiva are pink and non-injected, sclera clear  NECK: supple, thyroid normal size, non-tender, without nodularity LYMPH:  no palpable lymphadenopathy in the cervical, axillary  LUNGS: clear to auscultation and percussion with normal breathing effort HEART: regular rate & rhythm and no murmurs and no lower extremity edema ABDOMEN:abdomen soft,  non-tender and normal bowel sounds Musculoskeletal:no cyanosis of digits and no clubbing  NEURO: alert & oriented x 3 with fluent speech, no focal motor/sensory deficits BREAST: Breast inspection showed them to be symmetrical with no nipple discharge. Palpation of the breasts and axilla revealed no obvious mass that I could appreciate.   LABORATORY DATA:  I have reviewed the data as listed CBC Latest Ref Rng & Units 12/26/2020 10/03/2011 10/02/2011  WBC 4.0 - 10.5 K/uL 7.5 8.9 9.2  Hemoglobin 12.0 - 15.0 g/dL 11.6(L) 10.5(L) 12.4  Hematocrit 36.0 - 46.0 % 34.6(L) 30.9(L) 36.4  Platelets 150 - 400 K/uL 353 333 415(H)    CMP Latest Ref Rng & Units 12/26/2020 10/05/2011 10/03/2011  Glucose 70 - 99 mg/dL 139(H) 189(H) 116(H)  BUN 6 - 20 mg/dL 12 4(L) 6  Creatinine 0.44 - 1.00 mg/dL 0.89 0.67 0.71  Sodium 135 - 145 mmol/L 140 134(L) 138  Potassium 3.5 - 5.1 mmol/L 3.7 3.6 3.3(L)  Chloride 98 - 111 mmol/L 103 100 101  CO2 22 - 32 mmol/L 27 21 23   Calcium 8.9 - 10.3 mg/dL 9.6 8.8 8.4  Total Protein 6.5 - 8.1 g/dL 7.2 6.6 6.1  Total Bilirubin 0.3 - 1.2 mg/dL 0.8 0.7 0.9  Alkaline Phos 38 - 126 U/L 81 127(H) 127(H)  AST 15 - 41 U/L 19 70(H) 171(H)  ALT 0 - 44 U/L 23 172(H) 211(H)     RADIOGRAPHIC STUDIES: I have personally reviewed the radiological images as listed and agreed with the findings in the report. No results found.  ASSESSMENT & PLAN:  Carla Patrick is a 55 y.o. premenopausal female with a history of   1.  Malignant neoplasm of upper-outer quadrant of right breast,,  Invasive Lobular Carcinoma, Stage IA, c(T1bN0), ER+/PR-/HER2-, Grade 2, LCIS(+) -found on screening mammogram. Right diagnostic mammogram on 12/05/20 showed 0.7 cm irregular mass at 12 o'clock. Biopsy on 12/17/20 revealed invasive and in situ lobular carcinoma, grade 2. ER+/PR-/Her2-. -I discussed her breast imaging and needle biopsy results with patient and her family members in great detail. -She is a candidate for  breast conservation surgery. She has been seen by breast surgeon Dr. Marlou Starks, who recommends lumpectomy and sentinel lymph node biopsy. -I recommend a Oncotype Dx test on the surgical sample and we'll make a decision about adjuvant chemotherapy based on the Oncotype result. Written material of this test was given to her. She is young and fit, would be a good candidate for chemotherapy if her Oncotype recurrence score is high. -If her surgical sentinel lymph node positive, I recommend mammaprint for further risk stratification and guide adjuvant chemotherapy. -The risk of recurrence depends on the stage and biology of the tumor. She has invasive lobular carcinoma, with ER+ and HER2- markers. I discussed this is the second most common type of slow growing tumor, and the usually it is more sensitive to antiestrogen therapy, less sensitive to chemotherapy. -She will likely benefit from breast radiation if she  undergo lumpectomy to decrease the risk of breast cancer. She will discuss this further with Dr. Sondra Come today.  -Given the strong ER and PR expression in her premenopausal status, I recommend adjuvant endocrine therapy with Tamoxifen for 10 years to reduce the risk of cancer recurrence. Potential benefits and side effects were discussed with patient and she is interested.  If she became postmenopausal when she is on tamoxifen, we will switch her to aromatase inhibitor. -We discussed breast cancer surveillance after she completes treatment, Including annual mammogram, breast exam every 6-12 months.   PLAN:  -proceed with surgery under Dr. Marlou Starks -Oncotype Dx to be performed on final surgical sample if tumor >1cm or G2 and tumor >85m  -I will see her the last week of her radiation, or sooner if needed.   No orders of the defined types were placed in this encounter.   All questions were answered. The patient knows to call the clinic with any problems, questions or concerns. The total time spent in the  appointment was 60 minutes.     YTruitt Merle MD 12/26/2020 4:52 PM  I, KWilburn Mylar am acting as scribe for YTruitt Merle MD.   I have reviewed the above documentation for accuracy and completeness, and I agree with the above.

## 2020-12-26 NOTE — Patient Instructions (Signed)

## 2020-12-26 NOTE — Progress Notes (Signed)
[place] Psychosocial Distress Screening Spiritual Care  Met with Carla Patrick and her husband Carla Patrick in [facility] to introduce [place] team/resources, reviewing distress screen per protocol.  The patient scored a 6 on the Psychosocial Distress Thermometer which indicates moderate distress. Also assessed for distress and other psychosocial needs.   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 She is concerned about what lifestyle changes may be necessary due to treatment.  She reports good support and is discerning about whether to try Breast Cancer Support Group and/or an Pharmacist, hospital.  Follow up needed: Yes.  We plan to follow up by phone in two weeks for pastoral check-in and possibly registering for support programming/resources.   [address], [place], [facility] Pager [phone] Voicemail [phone]

## 2020-12-26 NOTE — Progress Notes (Signed)
Radiation Oncology         [phone] ________________________________  Multidisciplinary Breast Oncology Clinic [facility]) Initial Outpatient Consultation  Name: Carla Patrick MRN: [id number]  Date: 12/26/2020  DOB: Apr 15, 1966  [id number], Thayer Jew, MD  Carla Kussmaul, MD   REFERRING PHYSICIAN: Autumn Messing III, MD  DIAGNOSIS: The encounter diagnosis was Malignant neoplasm of upper-outer quadrant of right breast in female, estrogen receptor positive ([name]).  Stage IA (cT1b, cN0, cM0) Right Breast UOQ, Invasive Lobular Carcinoma, ER+ / PR- / Her2-, Grade 2.    ICD-10-CM   1. Malignant neoplasm of upper-outer quadrant of right breast in female, estrogen receptor positive ([place])  C50.411    Z17.0       HISTORY OF PRESENT ILLNESS::Carla Patrick is a 55 y.o. female who is presenting to the office today for evaluation of her newly diagnosed breast cancer. She is accompanied by her husband. She is doing well overall.   She had routine screening mammography on 11/19/20  showing a possible abnormality in the right breast. She underwent unilateral diagnostic mammography with tomography at [facility] on 12/05/20 showing: the 1 cm architectural distortion in the right breast as indeterminate. Ultrasound of the right breast performed on 12/05/20 revealed 0.4 x 0.5 x 0.7 cm taller than wide irregular mass with a spiculated margin in the right breast at 12 o'clock  middle depth 9 cm from the nipple. The irregular mass is noted to be hypoechoic.  Biopsy of right breast at 12 o'clock, middle depth, 9 cmfn on 12/17/20 showed: invasive mammary carcinoma and mammary carcinoma in situ. The carcinoma appeared grade 2; measuring 0.8 cm. Prognostic indicators significant for: estrogen receptor, 90% positive, with moderate staining intensity, and progesterone receptor, 0% negative. Proliferation marker Ki67 at 1%. HER2 negative.  Menarche: 55 years old LMP: May, 2022 Contraceptive: Yes, 1996-2000, and  2012-2017 HRT: N/A   The patient was referred today for presentation in the multidisciplinary conference.  Radiology studies and pathology slides were presented there for review and discussion of treatment options. A consensus was discussed regarding potential next steps.   PREVIOUS RADIATION THERAPY: No  PAST MEDICAL HISTORY:  Past Medical History:  Diagnosis Date   Abnormal EKG    Anemia    Asthma    Heart murmur    Hyperlipidemia    Hypertension     PAST SURGICAL HISTORY: Past Surgical History:  Procedure Laterality Date   BREAST EXCISIONAL BIOPSY  2007   left   CHOLECYSTECTOMY  10/04/2011   Procedure: LAPAROSCOPIC CHOLECYSTECTOMY WITH INTRAOPERATIVE CHOLANGIOGRAM;  Surgeon: Rolm Bookbinder, MD;  Location: [facility];  Service: General;  Laterality: N/A;    FAMILY HISTORY:  Family History  Problem Relation Age of Onset   HIV Father    Heart disease Maternal Grandmother    Diabetes Maternal Grandfather    Diabetes Paternal Grandmother     SOCIAL HISTORY:  Social History   Socioeconomic History   Marital status: Single    Spouse name: Not on file   Number of children: 0   Years of education: Not on file   Highest education level: Not on file  Occupational History   Not on file  Tobacco Use   Smoking status: Never   Smokeless tobacco: Never  Vaping Use   Vaping Use: Never used  Substance and Sexual Activity   Alcohol use: Yes    Alcohol/week: 0.0 standard drinks    Comment: occasional glass of wine   Drug use: No   Sexual activity:  Yes    Birth control/protection: None  Other Topics Concern   Not on file  Social History Narrative   Not on file   Social Determinants of Health   Financial Resource Strain: Not on file  Food Insecurity: Not on file  Transportation Needs: Not on file  Physical Activity: Not on file  Stress: Not on file  Social Connections: Not on file    ALLERGIES: No Known Allergies  MEDICATIONS:  Current Outpatient Medications   Medication Sig Dispense Refill   betamethasone, augmented, (DIPROLENE) 0.05 % lotion SMARTSIG:Sparingly Topical As Directed     drospirenone-ethinyl estradiol (YAZ,GIANVI,LORYNA) 3-0.02 MG tablet Take 1 tablet by mouth daily.     hydrochlorothiazide (HYDRODIURIL) 25 MG tablet Take 12.5 mg by mouth daily.     Lorcaserin HCl (BELVIQ) 10 MG TABS Take by mouth.     Multiple Vitamins-Iron (MULTIVITAMINS WITH IRON) TABS tablet 1 tablet     PROAIR HFA 108 (90 BASE) MCG/ACT inhaler      rosuvastatin (CRESTOR) 20 MG tablet Take 20 mg by mouth daily.     Semaglutide-Weight Management 2.4 MG/0.75ML SOAJ INJECT 2.4MG SUBCUTANEOUSLY EVERY WEEK 3 mL 2   SYMBICORT 160-4.5 MCG/ACT inhaler SMARTSIG:2 Puff(s) By Mouth Twice Daily PRN     telmisartan (MICARDIS) 80 MG tablet Take 80 mg by mouth daily.     No current facility-administered medications for this encounter.    REVIEW OF SYSTEMS: A 10+ POINT REVIEW OF SYSTEMS WAS OBTAINED including neurology, dermatology, psychiatry, cardiac, respiratory, lymph, extremities, GI, GU, musculoskeletal, constitutional, reproductive, HEENT. On the provided form, she reports sinus problems, palpable breast lump, arthritis, and depression. She denies any other symptoms.    PHYSICAL EXAM:   Vitals with BMI 12/26/2020  Height 4' 11.5"  Weight 191 lbs  BMI 93.79  Systolic 024  Diastolic 89  Pulse 097   Lungs are clear to auscultation bilaterally. Heart has regular rate and rhythm. No palpable cervical, supraclavicular, or axillary adenopathy. Abdomen soft, non-tender, normal bowel sounds. Breast: Left breast with no palpable mass, nipple discharge, or bleeding. Right breast with a slight barely-visible biopsy site in the upper aspect of the breast, no palpable mass, nipple discharge or bleeding.   KPS = 100  100 - Normal; no complaints; no evidence of disease. 90   - Able to carry on normal activity; minor signs or symptoms of disease. 80   - Normal activity with  effort; some signs or symptoms of disease. 24   - Cares for self; unable to carry on normal activity or to do active work. 60   - Requires occasional assistance, but is able to care for most of his personal needs. 50   - Requires considerable assistance and frequent medical care. 31   - Disabled; requires special care and assistance. 59   - Severely disabled; hospital admission is indicated although death not imminent. 9   - Very sick; hospital admission necessary; active supportive treatment necessary. 10   - Moribund; fatal processes progressing rapidly. 0     - Dead  Karnofsky DA, Abelmann [place], Craver LS and Burchenal [facility] [phone]) The use of the nitrogen mustards in the palliative treatment of carcinoma: with particular reference to bronchogenic carcinoma Cancer 1 634-56  LABORATORY DATA:  Lab Results  Component Value Date   WBC 7.5 12/26/2020   HGB 11.6 (L) 12/26/2020   HCT 34.6 (L) 12/26/2020   MCV 96.1 12/26/2020   PLT 353 12/26/2020   Lab Results  Component Value Date  NA 140 12/26/2020   K 3.7 12/26/2020   CL 103 12/26/2020   CO2 27 12/26/2020   Lab Results  Component Value Date   ALT 23 12/26/2020   AST 19 12/26/2020   ALKPHOS 81 12/26/2020   BILITOT 0.8 12/26/2020    PULMONARY FUNCTION TEST:   Recent Review Flowsheet Data   There is no flowsheet data to display.     RADIOGRAPHY: No results found.    IMPRESSION: Stage IA (cT1b, cN0, cM0) Right Breast UOQ, Invasive Lobular Carcinoma, ER+ / PR- / Her2-, Grade 2   Patient will be a good candidate for breast conservation with radiotherapy to right breast. We discussed the general course of radiation, potential side effects, and toxicities with radiation and the patient is interested in this approach.   Prior to surgery she will have an MRI to evaluate the extent of disease given the lobular histology.   PLAN:  MRI Right lumpectomy with sentinel node biopsy Oncotype testing  Adjuvant radiation therapy   Aromatase Inhibitor    ------------------------------------------------  Blair Promise, PhD, MD  This document serves as a record of services personally performed by Gery Pray, MD. It was created on his behalf by Roney Mans, a trained medical scribe. The creation of this record is based on the scribe's personal observations and the provider's statements to them. This document has been checked and approved by the attending provider.

## 2020-12-31 ENCOUNTER — Other Ambulatory Visit: Payer: BC Managed Care – PPO

## 2021-01-03 ENCOUNTER — Encounter: Payer: Self-pay | Admitting: *Deleted

## 2021-01-03 ENCOUNTER — Telehealth: Payer: Self-pay | Admitting: *Deleted

## 2021-01-03 NOTE — Telephone Encounter (Signed)
Left voicemail for a return phone call to follow up from [facility] 7/27.

## 2021-01-07 ENCOUNTER — Other Ambulatory Visit: Payer: Self-pay

## 2021-01-07 ENCOUNTER — Ambulatory Visit
Admission: RE | Admit: 2021-01-07 | Discharge: 2021-01-07 | Disposition: A | Discharge status: Home / Self Care | Payer: BC Managed Care – PPO | Source: Ambulatory Visit | Attending: General Surgery | Admitting: General Surgery

## 2021-01-07 DIAGNOSIS — C50411 Malignant neoplasm of upper-outer quadrant of right female breast: Secondary | ICD-10-CM

## 2021-01-07 DIAGNOSIS — Z170 Estrogen receptor positive status [ER+]: Secondary | ICD-10-CM

## 2021-01-07 MED ORDER — GADOBUTROL 1 MMOL/ML IV SOLN
9.0000 mL | Freq: Once | INTRAVENOUS | Status: AC | PRN
  Administered 2021-01-07: 9 mL via INTRAVENOUS

## 2021-01-08 ENCOUNTER — Encounter: Payer: Self-pay | Admitting: *Deleted

## 2021-01-09 ENCOUNTER — Encounter: Payer: Self-pay | Admitting: General Practice

## 2021-01-09 NOTE — Progress Notes (Signed)
[place] Spiritual Care Note  Reached Carla Patrick by phone for pastoral [facility] follow-up. 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 This focus is helping her stay present without too much stressing about diagnosis and treatment. She also notes that she has begun sharing more about her diagnosis with her social circle, which increases support.  Provided empathic listening and affirmation of strengths. Placed [facility] peer mentor referral per her request and plan to follow up by phone next month, once the school year has begun to settle. She has my direct dial number in case needs arise in the meantime.   Carla Patrick, [place], [facility] Pager [phone] Voicemail [phone]

## 2021-01-11 ENCOUNTER — Other Ambulatory Visit: Payer: BC Managed Care – PPO

## 2021-01-15 ENCOUNTER — Telehealth: Payer: Self-pay | Admitting: *Deleted

## 2021-01-15 NOTE — Telephone Encounter (Signed)
Returning patient's voicemail.  Left message for a return phone call.

## 2021-01-24 ENCOUNTER — Encounter (HOSPITAL_BASED_OUTPATIENT_CLINIC_OR_DEPARTMENT_OTHER): Payer: Self-pay | Admitting: General Surgery

## 2021-01-24 ENCOUNTER — Ambulatory Visit (HOSPITAL_COMMUNITY): Payer: BC Managed Care – PPO

## 2021-01-24 ENCOUNTER — Other Ambulatory Visit: Payer: Self-pay

## 2021-01-29 ENCOUNTER — Encounter (HOSPITAL_BASED_OUTPATIENT_CLINIC_OR_DEPARTMENT_OTHER)
Admission: RE | Admit: 2021-01-29 | Discharge: 2021-01-29 | Disposition: A | Discharge status: Home / Self Care | Payer: BC Managed Care – PPO | Source: Ambulatory Visit | Attending: General Surgery | Admitting: General Surgery

## 2021-01-29 ENCOUNTER — Other Ambulatory Visit (HOSPITAL_COMMUNITY): Payer: Self-pay

## 2021-01-29 DIAGNOSIS — Z7951 Long term (current) use of inhaled steroids: Secondary | ICD-10-CM | POA: Diagnosis not present

## 2021-01-29 DIAGNOSIS — C50411 Malignant neoplasm of upper-outer quadrant of right female breast: Secondary | ICD-10-CM | POA: Diagnosis present

## 2021-01-29 DIAGNOSIS — Z793 Long term (current) use of hormonal contraceptives: Secondary | ICD-10-CM | POA: Diagnosis not present

## 2021-01-29 DIAGNOSIS — Z79899 Other long term (current) drug therapy: Secondary | ICD-10-CM | POA: Diagnosis not present

## 2021-01-29 DIAGNOSIS — Z01810 Encounter for preprocedural cardiovascular examination: Secondary | ICD-10-CM | POA: Insufficient documentation

## 2021-01-29 DIAGNOSIS — Z9012 Acquired absence of left breast and nipple: Secondary | ICD-10-CM | POA: Diagnosis not present

## 2021-01-29 DIAGNOSIS — N6091 Unspecified benign mammary dysplasia of right breast: Secondary | ICD-10-CM | POA: Diagnosis not present

## 2021-01-29 DIAGNOSIS — Z170 Estrogen receptor positive status [ER+]: Secondary | ICD-10-CM | POA: Diagnosis not present

## 2021-01-29 LAB — BASIC METABOLIC PANEL
Anion gap: 10 (ref 5–15)
BUN: 15 mg/dL (ref 6–20)
CO2: 26 mmol/L (ref 22–32)
Calcium: 9.5 mg/dL (ref 8.9–10.3)
Chloride: 103 mmol/L (ref 98–111)
Creatinine, Ser: 0.82 mg/dL (ref 0.44–1.00)
GFR, Estimated: >60 mL/min (ref >60)
Glucose, Bld: 134 mg/dL — ABNORMAL HIGH (ref 70–99)
Potassium: 4.2 mmol/L (ref 3.5–5.1)
Sodium: 139 mmol/L (ref 135–145)

## 2021-01-29 MED FILL — Semaglutide (Weight Mngmt) Soln Auto-Injector 2.4 MG/0.75ML: SUBCUTANEOUS | 28 days supply | Qty: 3 | Fill #0 | Status: AC

## 2021-01-29 NOTE — Progress Notes (Signed)

## 2021-01-30 ENCOUNTER — Ambulatory Visit (HOSPITAL_BASED_OUTPATIENT_CLINIC_OR_DEPARTMENT_OTHER): Payer: BC Managed Care – PPO | Admitting: Certified Registered"

## 2021-01-30 ENCOUNTER — Encounter (HOSPITAL_BASED_OUTPATIENT_CLINIC_OR_DEPARTMENT_OTHER): Payer: Self-pay | Admitting: General Surgery

## 2021-01-30 ENCOUNTER — Ambulatory Visit (HOSPITAL_COMMUNITY)
Admission: RE | Admit: 2021-01-30 | Discharge: 2021-01-30 | Disposition: A | Discharge status: Home / Self Care | Payer: BC Managed Care – PPO | Source: Ambulatory Visit | Attending: General Surgery | Admitting: General Surgery

## 2021-01-30 ENCOUNTER — Encounter (HOSPITAL_BASED_OUTPATIENT_CLINIC_OR_DEPARTMENT_OTHER)
Admission: RE | Disposition: A | Discharge status: Home / Self Care | Payer: Self-pay | Source: Home / Self Care | Attending: General Surgery

## 2021-01-30 ENCOUNTER — Ambulatory Visit (HOSPITAL_COMMUNITY)
Admission: RE | Admit: 2021-01-30 | Discharge: 2021-01-30 | Disposition: A | Discharge status: Home / Self Care | Payer: BC Managed Care – PPO | Attending: General Surgery | Admitting: General Surgery

## 2021-01-30 ENCOUNTER — Other Ambulatory Visit (HOSPITAL_COMMUNITY): Payer: Self-pay

## 2021-01-30 ENCOUNTER — Other Ambulatory Visit: Payer: Self-pay

## 2021-01-30 DIAGNOSIS — C50411 Malignant neoplasm of upper-outer quadrant of right female breast: Secondary | ICD-10-CM | POA: Insufficient documentation

## 2021-01-30 DIAGNOSIS — Z9012 Acquired absence of left breast and nipple: Secondary | ICD-10-CM | POA: Insufficient documentation

## 2021-01-30 DIAGNOSIS — Z170 Estrogen receptor positive status [ER+]: Secondary | ICD-10-CM | POA: Insufficient documentation

## 2021-01-30 DIAGNOSIS — N6091 Unspecified benign mammary dysplasia of right breast: Secondary | ICD-10-CM | POA: Insufficient documentation

## 2021-01-30 DIAGNOSIS — Z7951 Long term (current) use of inhaled steroids: Secondary | ICD-10-CM | POA: Insufficient documentation

## 2021-01-30 DIAGNOSIS — Z79899 Other long term (current) drug therapy: Secondary | ICD-10-CM | POA: Insufficient documentation

## 2021-01-30 DIAGNOSIS — Z793 Long term (current) use of hormonal contraceptives: Secondary | ICD-10-CM | POA: Insufficient documentation

## 2021-01-30 HISTORY — PX: BREAST LUMPECTOMY WITH RADIOACTIVE SEED AND SENTINEL LYMPH NODE BIOPSY: SHX6550

## 2021-01-30 HISTORY — DX: Anxiety disorder, unspecified: F41.9

## 2021-01-30 HISTORY — DX: Depression, unspecified: F32.A

## 2021-01-30 LAB — POCT PREGNANCY, URINE: Preg Test, Ur: NEGATIVE

## 2021-01-30 SURGERY — BREAST LUMPECTOMY WITH RADIOACTIVE SEED AND SENTINEL LYMPH NODE BIOPSY
Anesthesia: General | Site: Breast | Laterality: Right

## 2021-01-30 MED ORDER — PHENYLEPHRINE 40 MCG/ML (10ML) SYRINGE FOR IV PUSH (FOR BLOOD PRESSURE SUPPORT)
PREFILLED_SYRINGE | INTRAVENOUS | Status: AC
  Filled 2021-01-30: qty 10

## 2021-01-30 MED ORDER — OXYCODONE HCL 5 MG PO TABS: 5.0000 mg | ORAL_TABLET | Freq: Once | ORAL | Status: DC | PRN

## 2021-01-30 MED ORDER — CELECOXIB 200 MG PO CAPS
200.0000 mg | ORAL_CAPSULE | ORAL | Status: AC
  Administered 2021-01-30: 200 mg via ORAL

## 2021-01-30 MED ORDER — PROPOFOL 10 MG/ML IV BOLUS
INTRAVENOUS | Status: DC | PRN
  Administered 2021-01-30: 100 mg via INTRAVENOUS
  Administered 2021-01-30: 200 mg via INTRAVENOUS

## 2021-01-30 MED ORDER — CEFAZOLIN SODIUM-DEXTROSE 2-4 GM/100ML-% IV SOLN
2.0000 g | INTRAVENOUS | Status: AC
Start: 2021-01-30 — End: 2021-01-30
  Administered 2021-01-30: 2 g via INTRAVENOUS

## 2021-01-30 MED ORDER — MIDAZOLAM HCL 2 MG/2ML IJ SOLN
INTRAMUSCULAR | Status: AC
  Filled 2021-01-30: qty 2

## 2021-01-30 MED ORDER — LIDOCAINE HCL (CARDIAC) PF 100 MG/5ML IV SOSY
PREFILLED_SYRINGE | INTRAVENOUS | Status: DC | PRN
  Administered 2021-01-30: 60 mg via INTRAVENOUS

## 2021-01-30 MED ORDER — 0.9 % SODIUM CHLORIDE (POUR BTL) OPTIME
TOPICAL | Status: DC | PRN
  Administered 2021-01-30: 1000 mL

## 2021-01-30 MED ORDER — FENTANYL CITRATE (PF) 100 MCG/2ML IJ SOLN
INTRAMUSCULAR | Status: DC | PRN
  Administered 2021-01-30 (×3): 25 ug via INTRAVENOUS

## 2021-01-30 MED ORDER — ROPIVACAINE HCL 5 MG/ML IJ SOLN
INTRAMUSCULAR | Status: DC | PRN
  Administered 2021-01-30: 30 mL via PERINEURAL

## 2021-01-30 MED ORDER — PROPOFOL 10 MG/ML IV BOLUS
INTRAVENOUS | Status: AC
  Filled 2021-01-30: qty 20

## 2021-01-30 MED ORDER — PROMETHAZINE HCL 25 MG/ML IJ SOLN: 6.2500 mg | INTRAMUSCULAR | Status: DC | PRN

## 2021-01-30 MED ORDER — DEXAMETHASONE SODIUM PHOSPHATE 10 MG/ML IJ SOLN
INTRAMUSCULAR | Status: DC | PRN
  Administered 2021-01-30: 8 mg via INTRAVENOUS

## 2021-01-30 MED ORDER — OXYCODONE HCL 5 MG/5ML PO SOLN: 5.0000 mg | Freq: Once | ORAL | Status: DC | PRN

## 2021-01-30 MED ORDER — PHENYLEPHRINE HCL (PRESSORS) 10 MG/ML IV SOLN
INTRAVENOUS | Status: DC | PRN
  Administered 2021-01-30: 80 ug via INTRAVENOUS
  Administered 2021-01-30: 40 ug via INTRAVENOUS
  Administered 2021-01-30 (×4): 80 ug via INTRAVENOUS

## 2021-01-30 MED ORDER — CELECOXIB 200 MG PO CAPS
ORAL_CAPSULE | ORAL | Status: AC
  Filled 2021-01-30: qty 1

## 2021-01-30 MED ORDER — ALBUTEROL SULFATE HFA 108 (90 BASE) MCG/ACT IN AERS
INHALATION_SPRAY | RESPIRATORY_TRACT | Status: DC | PRN
  Administered 2021-01-30: 12 via RESPIRATORY_TRACT

## 2021-01-30 MED ORDER — GABAPENTIN 300 MG PO CAPS
300.0000 mg | ORAL_CAPSULE | ORAL | Status: AC
  Administered 2021-01-30: 300 mg via ORAL

## 2021-01-30 MED ORDER — BUPIVACAINE-EPINEPHRINE 0.25% -1:200000 IJ SOLN
INTRAMUSCULAR | Status: DC | PRN
  Administered 2021-01-30: 24 mL

## 2021-01-30 MED ORDER — CEFAZOLIN SODIUM-DEXTROSE 2-4 GM/100ML-% IV SOLN
INTRAVENOUS | Status: AC
  Filled 2021-01-30: qty 100

## 2021-01-30 MED ORDER — NON FORMULARY
Status: DC | PRN
  Administered 2021-01-30: 2 mL

## 2021-01-30 MED ORDER — GABAPENTIN 300 MG PO CAPS
ORAL_CAPSULE | ORAL | Status: AC
  Filled 2021-01-30: qty 1

## 2021-01-30 MED ORDER — LACTATED RINGERS IV SOLN: INTRAVENOUS | Status: DC

## 2021-01-30 MED ORDER — EPHEDRINE SULFATE 50 MG/ML IJ SOLN
INTRAMUSCULAR | Status: DC | PRN
Start: 2021-01-30 — End: 2021-01-30
  Administered 2021-01-30 (×3): 10 mg via INTRAVENOUS

## 2021-01-30 MED ORDER — CHLORHEXIDINE GLUCONATE CLOTH 2 % EX PADS: 6.0000 | MEDICATED_PAD | Freq: Once | CUTANEOUS | Status: DC

## 2021-01-30 MED ORDER — ONDANSETRON HCL 4 MG/2ML IJ SOLN
INTRAMUSCULAR | Status: AC
  Filled 2021-01-30: qty 2

## 2021-01-30 MED ORDER — MEPERIDINE HCL 25 MG/ML IJ SOLN: 6.2500 mg | INTRAMUSCULAR | Status: DC | PRN

## 2021-01-30 MED ORDER — TECHNETIUM TC 99M TILMANOCEPT KIT
1.0000 | PACK | Freq: Once | INTRAVENOUS | Status: AC | PRN
  Administered 2021-01-30: 1 via INTRADERMAL

## 2021-01-30 MED ORDER — EPHEDRINE 5 MG/ML INJ
INTRAVENOUS | Status: AC
  Filled 2021-01-30: qty 10

## 2021-01-30 MED ORDER — ACETAMINOPHEN 500 MG PO TABS
1000.0000 mg | ORAL_TABLET | ORAL | Status: AC
Start: 2021-01-30 — End: 2021-01-30
  Administered 2021-01-30: 1000 mg via ORAL

## 2021-01-30 MED ORDER — FENTANYL CITRATE (PF) 100 MCG/2ML IJ SOLN
INTRAMUSCULAR | Status: AC
  Filled 2021-01-30: qty 2

## 2021-01-30 MED ORDER — ONDANSETRON HCL 4 MG/2ML IJ SOLN
INTRAMUSCULAR | Status: DC | PRN
  Administered 2021-01-30: 4 mg via INTRAVENOUS

## 2021-01-30 MED ORDER — LIDOCAINE HCL (PF) 2 % IJ SOLN
INTRAMUSCULAR | Status: AC
  Filled 2021-01-30: qty 5

## 2021-01-30 MED ORDER — AMISULPRIDE (ANTIEMETIC) 5 MG/2ML IV SOLN: 10.0000 mg | Freq: Once | INTRAVENOUS | Status: DC | PRN

## 2021-01-30 MED ORDER — HYDROCODONE-ACETAMINOPHEN 5-325 MG PO TABS
1.0000 | ORAL_TABLET | Freq: Four times a day (QID) | ORAL | 0 refills | Status: DC | PRN
  Filled 2021-01-30: qty 15, 4d supply, fill #0

## 2021-01-30 MED ORDER — HYDROMORPHONE HCL 1 MG/ML IJ SOLN: 0.2500 mg | INTRAMUSCULAR | Status: DC | PRN

## 2021-01-30 MED ORDER — FENTANYL CITRATE (PF) 100 MCG/2ML IJ SOLN
100.0000 ug | Freq: Once | INTRAMUSCULAR | Status: AC
  Administered 2021-01-30: 100 ug via INTRAVENOUS

## 2021-01-30 MED ORDER — DEXAMETHASONE SODIUM PHOSPHATE 10 MG/ML IJ SOLN
INTRAMUSCULAR | Status: AC
  Filled 2021-01-30: qty 1

## 2021-01-30 MED ORDER — ACETAMINOPHEN 500 MG PO TABS
ORAL_TABLET | ORAL | Status: AC
  Filled 2021-01-30: qty 2

## 2021-01-30 MED ORDER — MIDAZOLAM HCL 2 MG/2ML IJ SOLN
2.0000 mg | Freq: Once | INTRAMUSCULAR | Status: AC
  Administered 2021-01-30: 2 mg via INTRAVENOUS

## 2021-01-30 SURGICAL SUPPLY — 45 items
ADH SKN CLS APL DERMABOND .7 (GAUZE/BANDAGES/DRESSINGS) ×1
APL PRP STRL LF DISP 70% ISPRP (MISCELLANEOUS) ×1
APPLIER CLIP 9.375 MED OPEN (MISCELLANEOUS) ×4
APR CLP MED 9.3 20 MLT OPN (MISCELLANEOUS) ×2
BLADE SURG 15 STRL LF DISP TIS (BLADE) ×1 IMPLANT
BLADE SURG 15 STRL SS (BLADE) ×2
CANISTER SUC SOCK COL 7IN (MISCELLANEOUS) IMPLANT
CANISTER SUCT 1200ML W/VALVE (MISCELLANEOUS) ×1 IMPLANT
CHLORAPREP W/TINT 26 (MISCELLANEOUS) ×2 IMPLANT
CLIP APPLIE 9.375 MED OPEN (MISCELLANEOUS) ×2 IMPLANT
COVER BACK TABLE 60X90IN (DRAPES) ×2 IMPLANT
COVER MAYO STAND STRL (DRAPES) ×2 IMPLANT
COVER PROBE W GEL 5X96 (DRAPES) ×3 IMPLANT
DECANTER SPIKE VIAL GLASS SM (MISCELLANEOUS) IMPLANT
DERMABOND ADVANCED (GAUZE/BANDAGES/DRESSINGS) ×1
DERMABOND ADVANCED .7 DNX12 (GAUZE/BANDAGES/DRESSINGS) ×1 IMPLANT
DRAPE LAPAROSCOPIC ABDOMINAL (DRAPES) ×2 IMPLANT
DRAPE UTILITY XL STRL (DRAPES) ×2 IMPLANT
ELECT COATED BLADE 2.86 ST (ELECTRODE) ×2 IMPLANT
ELECT REM PT RETURN 9FT ADLT (ELECTROSURGICAL) ×2
ELECTRODE REM PT RTRN 9FT ADLT (ELECTROSURGICAL) ×1 IMPLANT
GLOVE SURG ENC MOIS LTX SZ7.5 (GLOVE) ×2 IMPLANT
GOWN STRL REUS W/ TWL LRG LVL3 (GOWN DISPOSABLE) ×3 IMPLANT
GOWN STRL REUS W/TWL LRG LVL3 (GOWN DISPOSABLE) ×6
ILLUMINATOR WAVEGUIDE N/F (MISCELLANEOUS) IMPLANT
KIT MARKER MARGIN INK (KITS) ×2 IMPLANT
LIGHT WAVEGUIDE WIDE FLAT (MISCELLANEOUS) IMPLANT
NDL HYPO 25X1 1.5 SAFETY (NEEDLE) ×1 IMPLANT
NDL SAFETY ECLIPSE 18X1.5 (NEEDLE) IMPLANT
NEEDLE HYPO 18GX1.5 SHARP (NEEDLE) ×2
NEEDLE HYPO 25X1 1.5 SAFETY (NEEDLE) ×2 IMPLANT
NS IRRIG 1000ML POUR BTL (IV SOLUTION) ×1 IMPLANT
PACK BASIN DAY SURGERY FS (CUSTOM PROCEDURE TRAY) ×2 IMPLANT
PENCIL SMOKE EVACUATOR (MISCELLANEOUS) ×2 IMPLANT
SLEEVE SCD COMPRESS KNEE MED (STOCKING) ×2 IMPLANT
SPONGE T-LAP 18X18 ~~LOC~~+RFID (SPONGE) ×2 IMPLANT
SUT MON AB 4-0 PC3 18 (SUTURE) ×4 IMPLANT
SUT SILK 2 0 SH (SUTURE) IMPLANT
SUT VICRYL 3-0 CR8 SH (SUTURE) ×2 IMPLANT
SYR CONTROL 10ML LL (SYRINGE) ×2 IMPLANT
TOWEL GREEN STERILE FF (TOWEL DISPOSABLE) ×2 IMPLANT
TRACER MAGTRACE VIAL (MISCELLANEOUS) ×2 IMPLANT
TRAY FAXITRON CT DISP (TRAY / TRAY PROCEDURE) ×2 IMPLANT
TUBE CONNECTING 20X1/4 (TUBING) ×1 IMPLANT
YANKAUER SUCT BULB TIP NO VENT (SUCTIONS) ×1 IMPLANT

## 2021-01-30 NOTE — Anesthesia Preprocedure Evaluation (Signed)
Anesthesia Evaluation  Patient identified by MRN, date of birth, ID band Patient awake    Reviewed: Allergy & Precautions, H&P , NPO status , Patient's Chart, lab work & pertinent test results, reviewed documented beta blocker date and time   Airway Mallampati: II  TM Distance: >3 FB Neck ROM: Full    Dental  (+) Teeth Intact, Dental Advisory Given   Pulmonary asthma ,  Inhaler prn, infrequent use   breath sounds clear to auscultation       Cardiovascular hypertension, negative cardio ROS   Rhythm:Regular Rate:Normal  Denies cardiac symptoms   Neuro/Psych Anxiety Depression negative neurological ROS  negative psych ROS   GI/Hepatic Neg liver ROS, cholecystitis   Endo/Other  negative endocrine ROSMorbid obesity  Renal/GU negative Renal ROS  negative genitourinary   Musculoskeletal negative musculoskeletal ROS (+)   Abdominal   Peds negative pediatric ROS (+)  Hematology Anemia Hgb 10.5   Anesthesia Other Findings Breast Cancer  Reproductive/Obstetrics negative OB ROS                             Anesthesia Physical  Anesthesia Plan  ASA: 3  Anesthesia Plan: General   Post-op Pain Management:  Regional for Post-op pain   Induction: Intravenous  PONV Risk Score and Plan: 3 and Ondansetron, Dexamethasone, Midazolam and Treatment may vary due to age or medical condition  Airway Management Planned: LMA  Additional Equipment:   Intra-op Plan:   Post-operative Plan: Extubation in OR  Informed Consent: I have reviewed the patients History and Physical, chart, labs and discussed the procedure including the risks, benefits and alternatives for the proposed anesthesia with the patient or authorized representative who has indicated his/her understanding and acceptance.     Dental advisory given  Plan Discussed with: CRNA and Surgeon  Anesthesia Plan Comments:          Anesthesia Quick Evaluation

## 2021-01-30 NOTE — Op Note (Signed)
01/30/2021  12:02 PM  PATIENT:  Carla Patrick  55 y.o. female  PRE-OPERATIVE DIAGNOSIS:  RIGHT BREAST CANCER  POST-OPERATIVE DIAGNOSIS:  RIGHT BREAST CANCER  PROCEDURE:  Procedure(s): RIGHT BREAST LUMPECTOMY WITH RADIOACTIVE SEED LOCALIZATION AND DEEP RIGHT AXILLARY SENTINEL LYMPH NODE BIOPSY (Right)  SURGEON:  Surgeon(s) and Role:    * Jovita Kussmaul, MD - Primary  PHYSICIAN ASSISTANT:   ASSISTANTS: none   ANESTHESIA:   local and general  EBL:  minimal   BLOOD ADMINISTERED:none  DRAINS: none   LOCAL MEDICATIONS USED:  MARCAINE     SPECIMEN:  Source of Specimen:  right breast tissue and sentinel nodes x 5  DISPOSITION OF SPECIMEN:  PATHOLOGY  COUNTS:  YES  TOURNIQUET:  * No tourniquets in log *  DICTATION: .Dragon Dictation  After informed consent was obtained the patient was brought to the operating room and placed in the supine position on the operating table.  After adequate induction of general anesthesia the patient's right chest, breast, and axillary area were prepped with ChloraPrep, allowed to dry, and draped in usual sterile manner.  An appropriate timeout was performed.  Previously an I-125 seed was placed in the upper outer quadrant of the right breast to mark an area of invasive breast cancer.  Also earlier in the day the patient underwent injection of 1 mCi of technetium sulfur colloid in the subareolar position on the right.  At this point 2 cc of iron oxide were also injected into the subareolar plexus and the breast was massaged for 5 minutes.  The neoprobe was set to technetium in the right axilla was examined and there was a good radioactive signal.  The mag trace device was also used and there was a good signal in the right axilla.  The area overlying this was infiltrated with quarter percent Marcaine.  A small transversely oriented incision was made with a 15 blade knife overlying the area of radioactivity.  The incision was carried through the skin and  subcutaneous tissue sharply with the electrocautery until the deep right axillary space was entered.  Both the neoprobe and mag trace were used to direct blunt hemostat dissection.  In total I was able to identify 5 lymph nodes that had a signal with both the neoprobe and the mag trace.  These lymph nodes were excised sharply with the electrocautery and the surrounding small vessels and lymphatics were controlled with clips.  No other hot or palpable nodes were identified in the right axilla.  Hemostasis was achieved using the Bovie electrocautery.  The deep layer of the incision was closed with interrupted 3-0 Vicryl stitches.  The skin was closed with a running 4-0 Monocryl subcuticular stitch.  Attention was then turned to the right breast.  The neoprobe was set to I-125 in the area of radioactivity was readily identified high in the upper outer quadrant.  The area around this was infiltrated with quarter percent Marcaine.  A curvilinear incision was then made to the skin overlying the area of radioactivity.  The incision was carried through the skin and subcutaneous tissue sharply with the electrocautery.  Dissection was then carried widely around the radioactive seed we will check an area of radioactivity frequently.  This dissection was carried all the way to the muscle of the chest wall.  Once the specimen was removed it was oriented with the appropriate paint colors.  A specimen radiograph was obtained that showed the clip and seed to be near the center of  the specimen.  The specimen was then sent to pathology for further evaluation.  Hemostasis was achieved using the Bovie electrocautery.  The wound was then irrigated with saline and infiltrated with more quarter percent Marcaine.  The cavity was marked with clips.  The deep layer of the wound was then closed with layers of interrupted 3-0 Vicryl stitches.  The skin was closed with a running 4-0 Monocryl subcuticular stitch.  Dermabond dressings were  applied.  The patient tolerated the procedure well.  At the end of the case all needle sponge and instrument counts were correct.  The patient was then awakened and taken to recovery in stable condition.  PLAN OF CARE: Discharge to home after PACU  PATIENT DISPOSITION:  PACU - hemodynamically stable.   Delay start of Pharmacological VTE agent (>24hrs) due to surgical blood loss or risk of bleeding: not applicable

## 2021-01-30 NOTE — Progress Notes (Signed)
Assisted Dr. Miller with right, ultrasound guided, pectoralis block. Side rails up, monitors on throughout procedure. See vital signs in flow sheet. Tolerated Procedure well. 

## 2021-01-30 NOTE — Progress Notes (Signed)
Nuc med at bedside for right breast injections.  Pt tolerated procedure well and VSS.

## 2021-01-30 NOTE — Anesthesia Postprocedure Evaluation (Signed)
Anesthesia Post Note  Patient: Carla Patrick  Procedure(s) Performed: RIGHT BREAST LUMPECTOMY WITH RADIOACTIVE SEED AND SENTINEL LYMPH NODE BIOPSY (Right: Breast)     Patient location during evaluation: PACU Anesthesia Type: General Level of consciousness: awake and alert Pain management: pain level controlled Vital Signs Assessment: post-procedure vital signs reviewed and stable Respiratory status: spontaneous breathing, nonlabored ventilation and respiratory function stable Cardiovascular status: blood pressure returned to baseline and stable Postop Assessment: no apparent nausea or vomiting Anesthetic complications: no   No notable events documented.  Last Vitals:  Vitals:   01/30/21 1230 01/30/21 1305  BP: 133/73 (!) 147/87  Pulse: 85 79  Resp: 16 15  Temp:  36.7 C  SpO2: 97% 94%    Last Pain:  Vitals:   01/30/21 1305  TempSrc:   PainSc: 0-No pain                 Lynda Rainwater

## 2021-01-30 NOTE — Transfer of Care (Signed)
Immediate Anesthesia Transfer of Care Note  Patient: Carla Patrick  Procedure(s) Performed: RIGHT BREAST LUMPECTOMY WITH RADIOACTIVE SEED AND SENTINEL LYMPH NODE BIOPSY (Right: Breast)  Patient Location: PACU  Anesthesia Type:GA combined with regional for post-op pain  Level of Consciousness: drowsy  Airway & Oxygen Therapy: Patient Spontanous Breathing and Patient connected to face mask oxygen  Post-op Assessment: Report given to RN and Post -op Vital signs reviewed and stable  Post vital signs: Reviewed and stable  Last Vitals:  Vitals Value Taken Time  BP 145/95 01/30/21 1215  Temp    Pulse 90 01/30/21 1216  Resp 20 01/30/21 1216  SpO2 100 % 01/30/21 1216  Vitals shown include unvalidated device data.  Last Pain:  Vitals:   01/30/21 0913  TempSrc: Oral  PainSc: 0-No pain      Patients Stated Pain Goal: 4 ([id number])  Complications: No notable events documented.

## 2021-01-30 NOTE — Anesthesia Procedure Notes (Signed)
Anesthesia Regional Block: Pectoralis block   Pre-Anesthetic Checklist: , timeout performed,  Correct Patient, Correct Site, Correct Laterality,  Correct Procedure, Correct Position, site marked,  Risks and benefits discussed,  Surgical consent,  Pre-op evaluation,  At surgeon's request and post-op pain management  Laterality: Right  Prep: chloraprep       Needles:  Injection technique: Single-shot  Needle Type: Stimiplex     Needle Length: 9cm  Needle Gauge: 21     Additional Needles:   Procedures:,,,, ultrasound used (permanent image in chart),,    Narrative:  Start time: 01/30/2021 9:52 AM End time: 01/30/2021 9:57 AM Injection made incrementally with aspirations every 5 mL.  Performed by: Personally  Anesthesiologist: Lynda Rainwater, MD

## 2021-01-30 NOTE — Interval H&P Note (Signed)
History and Physical Interval Note:  01/30/2021 10:07 AM  Carla Patrick  has presented today for surgery, with the diagnosis of RIGHT BREAST CANCER.  The various methods of treatment have been discussed with the patient and family. After consideration of risks, benefits and other options for treatment, the patient has consented to  Procedure(s): RIGHT BREAST LUMPECTOMY WITH RADIOACTIVE SEED AND SENTINEL LYMPH NODE BIOPSY (Right) as a surgical intervention.  The patient's history has been reviewed, patient examined, no change in status, stable for surgery.  I have reviewed the patient's chart and labs.  Questions were answered to the patient's satisfaction.     Autumn Messing III

## 2021-01-30 NOTE — Anesthesia Procedure Notes (Signed)
Procedure Name: LMA Insertion Date/Time: 01/30/2021 10:37 AM Performed by: Lavonia Dana, CRNA Pre-anesthesia Checklist: Patient identified, Emergency Drugs available, Suction available and Patient being monitored Patient Re-evaluated:Patient Re-evaluated prior to induction Oxygen Delivery Method: Circle system utilized Preoxygenation: Pre-oxygenation with 100% oxygen Induction Type: IV induction Ventilation: Mask ventilation without difficulty LMA: LMA inserted LMA Size: 4.0 Number of attempts: 1 Airway Equipment and Method: Bite block Placement Confirmation: positive ETCO2 Tube secured with: Tape Dental Injury: Teeth and Oropharynx as per pre-operative assessment

## 2021-01-30 NOTE — H&P (Signed)
PROVIDER:  Landry Corporal, MD   MRN: [id number] DOB: 1965-10-15   Subjective    Chief Complaint: No chief complaint on file.       History of Present Illness: Carla Patrick is a 55 y.o. female who is seen today as an office consultation at the request of Dr. Pasty Arch for evaluation of No chief complaint on file. .     We are asked to see the patient in consultation by Dr. Burr Medico to evaluate her for a new right breast cancer.  The patient has a 56 year old black female who recently went for a routine screening mammogram.  At that time she was found to have a 1 cm distortion in the upper portion of the right breast.  The axilla looked negative.  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  She does not smoke.     Review of Systems: A complete review of systems was obtained from the patient.  I have reviewed this information and discussed as appropriate with the patient.  See HPI as well for other ROS.   Review of Systems  Constitutional: Negative.   HENT: Negative.   Eyes: Negative.   Respiratory: Negative.   Cardiovascular: Negative.   Gastrointestinal: Negative.   Genitourinary: Negative.   Musculoskeletal: Negative.   Skin: Negative.   Neurological: Negative.   Endo/Heme/Allergies: Negative.   Psychiatric/Behavioral: Negative.         Medical History: Past Medical History      Past Medical History:  Diagnosis Date   Asthma     Hypertension             Patient Active Problem List  Diagnosis   Abnormal electrocardiogram (ECG) (EKG)   Malignant neoplasm of upper-outer quadrant of right breast in female, estrogen receptor positive (CMS-HCC)      Past Surgical History       Past Surgical History:  Procedure Laterality Date   CHOLECYSTECTOMY       MASTECTOMY PARTIAL / LUMPECTOMY Left 2007        Allergies  No Known Allergies           Current Outpatient  Medications on File Prior to Visit  Medication Sig Dispense Refill   betamethasone, augmented, (DIPROLENE) 0.05 % lotion SMARTSIG:Sparingly Topical As Directed       budesonide-formoteroL (SYMBICORT) 160-4.5 mcg/actuation inhaler SMARTSIG:2 Puff(s) By Mouth Twice Daily PRN       rosuvastatin (CRESTOR) 20 MG tablet Take 20 mg by mouth once daily       semaglutide (WEGOVY) 2.4 mg/0.75 mL pen injector INJECT 2.4MG SUBCUTANEOUSLY EVERY WEEK       telmisartan (MICARDIS) 80 MG tablet Take by mouth       buPROPion (WELLBUTRIN XL) 300 MG XL tablet Take by mouth       drospirenone-ethinyl estradioL (YAZ) 3-0.02 mg tablet Take 1 tablet by mouth once daily       hydroCHLOROthiazide (HYDRODIURIL) 12.5 MG tablet Take by mouth       lorcaserin 10 mg Tab Take by mouth        No current facility-administered medications on file prior to visit.      Family History  No family history on file.      Social History       Tobacco Use  Smoking Status Never Smoker  Smokeless Tobacco Never Used  Social History  Social History        Socioeconomic History   Marital status: Married  Tobacco Use   Smoking status: Never Smoker   Smokeless tobacco: Never Used  Substance and Sexual Activity   Alcohol use: Not Currently        Objective:      There were no vitals filed for this visit.  There is no height or weight on file to calculate BMI.   Physical Exam Vitals reviewed.  Constitutional:      General: She is not in acute distress.    Appearance: Normal appearance.  HENT:     Head: Normocephalic and atraumatic.     Right Ear: External ear normal.     Left Ear: External ear normal.     Nose: Nose normal.     Mouth/Throat:     Mouth: Mucous membranes are moist.     Pharynx: Oropharynx is clear.  Eyes:     General: No scleral icterus.    Extraocular Movements: Extraocular movements intact.     Conjunctiva/sclera: Conjunctivae normal.     Pupils: Pupils are equal, round, and reactive  to light.  Cardiovascular:     Rate and Rhythm: Normal rate and regular rhythm.     Pulses: Normal pulses.     Heart sounds: Normal heart sounds.  Pulmonary:     Effort: Pulmonary effort is normal. No respiratory distress.     Breath sounds: Normal breath sounds.  Abdominal:     General: Bowel sounds are normal.     Palpations: Abdomen is soft.     Tenderness: There is no abdominal tenderness.  Musculoskeletal:        General: No swelling, tenderness or deformity. Normal range of motion.     Cervical back: Normal range of motion and neck supple.  Skin:    General: Skin is warm and dry.     Coloration: Skin is not jaundiced.  Neurological:     General: No focal deficit present.     Mental Status: She is alert and oriented to person, place, and time.  Psychiatric:        Mood and Affect: Mood normal.        Behavior: Behavior normal.     Breast: There is no palpable mass in either breast.  There is no palpable axillary, supraclavicular, or cervical lymphadenopathy.         Labs, Imaging and Diagnostic Testing:     Assessment and Plan:  Diagnoses and all orders for this visit:   Malignant neoplasm of upper-outer quadrant of right breast in female, estrogen receptor positive (CMS-HCC)       The patient appears to have a 1 cm cancer in the upper portion of the right breast with clinically negative nodes.  I have discussed with her the different options for treatment and at this point she favors breast conservation which I feel is very reasonable.  She is also a good candidate for sentinel node biopsy.  I have discussed with her in detail the risks and benefits of the operation as well as some of the technical aspects including the use of a radioactive seed for localization and she understands and wishes to proceed.  We will call her with the results of the MRI study.  She will meet with medical and radiation oncology to discuss adjuvant therapy.

## 2021-01-30 NOTE — Discharge Instructions (Addendum)
  Post Anesthesia Home Care Instructions  Activity: Get plenty of rest for the remainder of the day. A responsible individual must stay with you for 24 hours following the procedure.  For the next 24 hours, DO NOT: -Drive a car -Paediatric nurse -Drink alcoholic beverages -Take any medication unless instructed by your physician -Make any legal decisions or sign important papers.  Meals: Start with liquid foods such as gelatin or soup. Progress to regular foods as tolerated. Avoid greasy, spicy, heavy foods. If nausea and/or vomiting occur, drink only clear liquids until the nausea and/or vomiting subsides. Call your physician if vomiting continues.  Special Instructions/Symptoms: Your throat may feel dry or sore from the anesthesia or the breathing tube placed in your throat during surgery. If this causes discomfort, gargle with warm salt water. The discomfort should disappear within 24 hours.  If you had a scopolamine patch placed behind your ear for the management of post- operative nausea and/or vomiting:  1. The medication in the patch is effective for 72 hours, after which it should be removed.  Wrap patch in a tissue and discard in the trash. Wash hands thoroughly with soap and water. 2. You may remove the patch earlier than 72 hours if you experience unpleasant side effects which may include dry mouth, dizziness or visual disturbances. 3. Avoid touching the patch. Wash your hands with soap and water after contact with the patch.     No tylenol until after 3:30pm today. No ibuprofen/motrin until after 5:30pm today.

## 2021-02-01 ENCOUNTER — Encounter (HOSPITAL_BASED_OUTPATIENT_CLINIC_OR_DEPARTMENT_OTHER): Payer: Self-pay | Admitting: General Surgery

## 2021-02-06 ENCOUNTER — Encounter: Payer: Self-pay | Admitting: *Deleted

## 2021-02-06 ENCOUNTER — Encounter: Payer: Self-pay | Admitting: General Practice

## 2021-02-06 LAB — SURGICAL PATHOLOGY

## 2021-02-06 NOTE — Progress Notes (Signed)
[facility] Spiritual Care Note  Followed up with Carla Patrick by phone for post-op pastoral check-in. Provided empathic listening, pastoral reflection, and affirmation of strengths, particularly her gentle self-care as she navigates decreased energy and walking the line between maintaining desired privacy and disclosing as needed for additional support.  She has received contact from her Pharmacist, hospital and has been added to the [facility] email list. We plan to follow up by phone in ca two weeks.   [place], [place], [facility] Pager [phone] Voicemail [phone]

## 2021-02-08 ENCOUNTER — Encounter: Payer: Self-pay | Admitting: *Deleted

## 2021-02-11 ENCOUNTER — Encounter: Payer: Self-pay | Admitting: *Deleted

## 2021-02-11 ENCOUNTER — Telehealth: Payer: Self-pay | Admitting: *Deleted

## 2021-02-11 NOTE — Telephone Encounter (Signed)
Received order for oncotype testing. Requisition faxed to pathology and [facility] °

## 2021-02-20 ENCOUNTER — Encounter: Payer: Self-pay | Admitting: General Practice

## 2021-02-20 NOTE — Progress Notes (Signed)
[facility] Spiritual Care Note  Left voicemail of support and care per our follow-up plan, encouraging return call.   [place], [place], [facility] Pager [phone] Voicemail [phone]

## 2021-02-21 ENCOUNTER — Encounter (HOSPITAL_COMMUNITY): Payer: Self-pay

## 2021-02-22 ENCOUNTER — Telehealth: Payer: Self-pay | Admitting: *Deleted

## 2021-02-22 ENCOUNTER — Encounter: Payer: Self-pay | Admitting: *Deleted

## 2021-02-22 DIAGNOSIS — C50411 Malignant neoplasm of upper-outer quadrant of right female breast: Secondary | ICD-10-CM

## 2021-02-22 DIAGNOSIS — Z170 Estrogen receptor positive status [ER+]: Secondary | ICD-10-CM

## 2021-02-22 NOTE — Telephone Encounter (Signed)
Received oncotype results of 22/8%. Patient is aware.  Referral placed to Dr. Sondra Come.

## 2021-02-25 ENCOUNTER — Other Ambulatory Visit: Payer: Self-pay

## 2021-02-25 ENCOUNTER — Encounter: Payer: Self-pay | Admitting: Hematology

## 2021-02-25 ENCOUNTER — Encounter: Payer: Self-pay | Admitting: Physical Therapy

## 2021-02-25 ENCOUNTER — Ambulatory Visit: Payer: BC Managed Care – PPO | Attending: General Surgery | Admitting: Physical Therapy

## 2021-02-25 DIAGNOSIS — R293 Abnormal posture: Secondary | ICD-10-CM | POA: Diagnosis present

## 2021-02-25 DIAGNOSIS — Z170 Estrogen receptor positive status [ER+]: Secondary | ICD-10-CM | POA: Diagnosis present

## 2021-02-25 DIAGNOSIS — Z483 Aftercare following surgery for neoplasm: Secondary | ICD-10-CM | POA: Diagnosis present

## 2021-02-25 DIAGNOSIS — M25611 Stiffness of right shoulder, not elsewhere classified: Secondary | ICD-10-CM

## 2021-02-25 DIAGNOSIS — C50411 Malignant neoplasm of upper-outer quadrant of right female breast: Secondary | ICD-10-CM

## 2021-02-25 NOTE — Therapy (Signed)
[place], [place], [address] Phone: [phone]   Fax:  [phone]  Physical Therapy Treatment  Patient Details  Name: Carla Patrick MRN: [id number] Date of Birth: February 14, 1966 Referring Provider (PT): Dr. Autumn Messing   Encounter Date: 02/25/2021   PT End of Session - 02/25/21 1044     Visit Number 2    Number of Visits 3    Date for PT Re-Evaluation 04/01/21    PT Start Time 1006    PT Stop Time 1045    PT Time Calculation (min) 39 min    Activity Tolerance Patient tolerated treatment well    Behavior During Therapy [facility] for tasks assessed/performed             Past Medical History:  Diagnosis Date   Abnormal EKG    Anemia    Anxiety    Asthma    Cancer ([place]) 11/2020   right breast [facility]   Depression    Heart murmur    Hyperlipidemia    Hypertension     Past Surgical History:  Procedure Laterality Date   BREAST EXCISIONAL BIOPSY  2007   left   BREAST LUMPECTOMY WITH RADIOACTIVE SEED AND SENTINEL LYMPH NODE BIOPSY Right 01/30/2021   Procedure: RIGHT BREAST LUMPECTOMY WITH RADIOACTIVE SEED AND SENTINEL LYMPH NODE BIOPSY;  Surgeon: Jovita Kussmaul, MD;  Location: [place];  Service: General;  Laterality: Right;   CHOLECYSTECTOMY  10/04/2011   Procedure: LAPAROSCOPIC CHOLECYSTECTOMY WITH INTRAOPERATIVE CHOLANGIOGRAM;  Surgeon: Rolm Bookbinder, MD;  Location: [facility];  Service: General;  Laterality: N/A;    There were no vitals filed for this visit.   Subjective Assessment - 02/25/21 1008     Subjective Patient reports she underwent a right lumpectomy and sentinel node biopsy (1 of 8 nodes positive) on 01/30/2021. Oncotype score was 22 so no chemo needed but she will progress to radiation 03/04/2021.    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 She underwent a right lumpectomy and sentinel node biopsy  (1 of 8 nodes positive) on 01/30/2021 It is ER positive, PR negative, andHER2 negative with a Ki67 of 1%.    Patient Stated Goals See if my arm is back to normal    Currently in Pain? Yes    Pain Score 3     Pain Location Axilla    Pain Orientation Right    Pain Descriptors / Indicators Numbness    Pain Type Surgical pain    Pain Onset 1 to 4 weeks ago    Pain Frequency Intermittent    Aggravating Factors  Reaching    Pain Relieving Factors Resting    Multiple Pain Sites No                [facility] PT Assessment - 02/25/21 0001       Assessment   Medical Diagnosis s/p right lumpectomy and SLNB    Referring Provider (PT) Dr. Autumn Messing    Onset Date/Surgical Date 01/30/21    Hand Dominance Right    Prior Therapy Baselines      Precautions   Precautions Other (comment)    Precaution Comments recent surgery      Restrictions   Weight Bearing Restrictions No      Balance Screen   Has the patient fallen in the past 6 months No    Has the patient had a decrease in activity  level because of a fear of falling?  No    Is the patient reluctant to leave their home because of a fear of falling?  No      Home Ecologist residence    Living Arrangements Spouse/significant other    Available Help at Discharge Family      Prior Function   Level of Independence Independent    Vocation Full time employment    Geneticist, molecular professor at [employer] She is not exercising      Cognition   Overall Cognitive Status Within Functional Limits for tasks assessed      Observation/Other Assessments   Observations Incisions on right breast and axilla both appear to be healing well with glue still present on her breast incision. Significant edema present in right breast but she is wearing a bra that provides no compression. Palpable axillary cording present.      Posture/Postural Control   Posture/Postural Control Postural  limitations    Postural Limitations Rounded Shoulders;Forward head      ROM / Strength   AROM / PROM / Strength AROM      AROM   AROM Assessment Site Shoulder    Right/Left Shoulder Right    Right Shoulder Extension 56 Degrees    Right Shoulder Flexion 145 Degrees    Right Shoulder ABduction 134 Degrees    Right Shoulder Internal Rotation 76 Degrees    Right Shoulder External Rotation 84 Degrees      Strength   Overall Strength Within functional limits for tasks performed               LYMPHEDEMA/ONCOLOGY QUESTIONNAIRE - 02/25/21 0001       Type   Cancer Type Right breast cancer      Surgeries   Lumpectomy Date 01/30/21    Sentinel Lymph Node Biopsy Date 01/30/21    Number Lymph Nodes Removed 8      Treatment   Active Chemotherapy Treatment No    Past Chemotherapy Treatment No    Active Radiation Treatment No    Past Radiation Treatment No    Current Hormone Treatment No    Past Hormone Therapy No      What other symptoms do you have   Are you Having Heaviness or Tightness Yes    Are you having Pain Yes    Are you having pitting edema No    Is it Hard or Difficult finding clothes that fit No    Do you have infections No    Is there Decreased scar mobility Yes    Stemmer Sign No      Lymphedema Assessments   Lymphedema Assessments Upper extremities      Right Upper Extremity Lymphedema   10 cm Proximal to Olecranon Process 38 cm    Olecranon Process 26.5 cm    10 cm Proximal to Ulnar Styloid Process 24.7 cm    Just Proximal to Ulnar Styloid Process 15.5 cm    Across Hand at [employer] 18.5 cm    At [place] of 2nd Digit 6.5 cm      Left Upper Extremity Lymphedema   10 cm Proximal to Olecranon Process 36.1 cm    Olecranon Process 25.7 cm    10 cm Proximal to Ulnar Styloid Process 23.6 cm    Just Proximal to Ulnar Styloid Process 15.2 cm    Across Hand at [employer] 17.8 cm  At [facility] of 2nd Digit 6.5 cm                Katina Dung -  02/25/21 0001     Open a tight or new jar Mild difficulty    Do heavy household chores (wash walls, wash floors) No difficulty    Carry a shopping bag or briefcase No difficulty    Wash your back No difficulty    Use a knife to cut food No difficulty    Recreational activities in which you take some force or impact through your arm, shoulder, or hand (golf, hammering, tennis) No difficulty    During the past week, to what extent has your arm, shoulder or hand problem interfered with your normal social activities with family, friends, neighbors, or groups? Not at all    During the past week, to what extent has your arm, shoulder or hand problem limited your work or other regular daily activities Not at all    Arm, shoulder, or hand pain. Mild    Tingling (pins and needles) in your arm, shoulder, or hand Severe    Difficulty Sleeping Moderate difficulty    DASH Score 15.91 %                             PT Education - 02/25/21 1042     Education Details Scar massage; HEP; where to get a compression bra    Person(s) Educated Patient    Methods Explanation;Demonstration;Handout    Comprehension Verbalized understanding;Returned demonstration                 PT Long Term Goals - 02/25/21 1048       PT LONG TERM GOAL #1   Title Patient will demonstrate she has regained shoulder ROM and function post operatively compared to baselines.    Time 5    Period Weeks    Status On-going    Target Date 04/01/21                   Plan - 02/25/21 1045     Clinical Impression Statement Patient is generally doing well s/p right lumpectomy and sentinel node biopsy on 01/30/2021 with 8 axillary nodes removed (1 positive). She has edema in her right brast and will benefit from a compression bra. She is lacking about 20 degrees of flexion and abduction and that is likely due to plalpable axillary cording. She has not yet begun the HEP so she would like to try that for  1 month and then return for a reassessment. If still limited, we will begin PT.    PT Frequency --   1 additional visit 04/01/2021   PT Treatment/Interventions ADLs/Self Care Home Management;Patient/family education;Therapeutic exercise    PT Next Visit Plan Reassess neural tension in right arm / cording; measure shoulder ROM; assess swelling in breast. Will see back in 1 month; if limited, begin PT    PT Home Exercise Plan Post op shoulder ROM HEP and neural stretch    Consulted and Agree with Plan of Care Patient             Patient will benefit from skilled therapeutic intervention in order to improve the following deficits and impairments:  Postural dysfunction, Decreased range of motion, Decreased knowledge of precautions, Impaired UE functional use, Pain, Increased edema  Visit Diagnosis: Malignant neoplasm of upper-outer quadrant of right breast in female, estrogen receptor positive ([place]) -  Plan: PT plan of care cert/re-cert  Abnormal posture - Plan: PT plan of care cert/re-cert  Stiffness of right shoulder, not elsewhere classified - Plan: PT plan of care cert/re-cert  Aftercare following surgery for neoplasm - Plan: PT plan of care cert/re-cert     Problem List Patient Active Problem List   Diagnosis Date Noted   Malignant neoplasm of upper-outer quadrant of right breast in female, estrogen receptor positive ([place]) 12/24/2020   Right knee pain 11/08/2013   Abnormal EKG 07/15/2013   Carla Patrick, PT 02/25/21 10:49 AM   [place], [place], [address] Phone: [phone]   Fax:  [phone]  Name: Carla Patrick MRN: [id number] Date of Birth: 12-05-1965

## 2021-02-25 NOTE — Patient Instructions (Addendum)
            [facility]         [address], [place] [address]         [phone]         Annia Friendly, PT, CLT   After Breast Cancer Class It is recommended you attend the ABC class to be educated on lymphedema risk reduction. This class is free of charge and lasts for 1 hour. It is a 1-time class. You are scheduled for Octobre 17th at 11:00. We will send you a webex link in your email.  Scar massage You can begin gentle scar massage using coconut oil to your armpit incision. Once the glue comes off the breast incision, begin using it there too. Just a few minutes each day. Stretch the skin different directions and then apply the coconut oil. Don't go to radiation with coconut oil on your skin.  Compression garment You would benefit from a compression bra to reduce swelling. Info given today.  Home exercise Program Begin doing your stretches - do the first and last ones on the sheet laying on your back. Also do the nerve stretch below.  Follow up PT: It is recommended you return every 3 months for the first 3 years following surgery to be assessed on the SOZO machine for an L-Dex score. This helps prevent clinically significant lymphedema in 95% of patients. These follow up screens are 10 minute appointments that you are not billed for. APPOINTMENTS FOR SOZO SCREENS AFTER 03/04/2021 WILL BE LOCATED AT [facility] AT [address] [place] [address]. Please call us to confirm we have moved if your appointment is scheduled after October 3rd, 2022. The phone number is [phone]. You are scheduled for Dec. 12th at 8:30.  Axillary web syndrome (also called cording) can happen after having breast cancer surgery when lymph nodes in the armpit are removed. It presents as if you have a thin cord in your arm and can run from the armpit all the way down into the forearm. If you've had a sentinel node biopsy, the risk is 1-20% and if you've  had an axillary lymph node dissection (more than 7 nodes removed), the risk is 36-72%. The ranges vary depending on the research study.  It most often happens 3-4 weeks post-op but can happen sooner or later. There are several possibilities for what cording actually is. Although no one knows for sure as of yet, it may be related to lymphatics, veins, or other tissue. Sometimes cording resolves on its own but other times it requires physical therapy with a therapist who specializes in lymphedema and/or cancer rehab. Treatment typically involves stretching, manual techniques, and exercise. Sometimes cords get "released" while stretching or during manual treatment and the patient may experience the sensation of a "pop." This may feel strange but it is not dangerous and is a sign that the cord has released; range of motion may be improved in the process.  MEDIAN NERVE: Mobilization XI    Stand with right palm flat on wall, fingers back, elbow bent, head tilted away. Sidestep away from wall, straightening elbow. Do 1 set of 5 repetitions per session, holding 5 seconds. Do _2-3__ sessions per day.  Copyright  VHI. All rights reserved.

## 2021-02-26 ENCOUNTER — Encounter: Payer: Self-pay | Admitting: *Deleted

## 2021-02-27 NOTE — Progress Notes (Signed)
Location of Breast Cancer: upper-outer quadrant of right breast  Histology per Pathology Report:  Breast, right, needle core biopsy, 12 O'CLOCK middle depth, 9 cmfn - INVASIVE MAMMARY CARCINOMA - MAMMARY CARCINOMA IN SITU  Receptor Status:  The tumor cells are EQUIVOCAL for Her2 (2+). Her2 by FISH will be performed and results reported separately. Estrogen Receptor: 90%, POSITIVE, MODERATE STAINING INTENSITY Progesterone Receptor: 0%, NEGATIVE Proliferation Marker Ki67: 1%   FLUORESCENCE IN-SITU HYBRIDIZATION Results: GROUP 5: HER 2 **NEGATIVE** Equivocal form of amplification of the HER2 gene was detected in the IHC 2+ tissue sample received from this individual. HER2 FISH was performed by a technologist and cell imaging and analysis on the BioView. RATIO OF ER2/CEN17 SIGNALS 1.15 AVERAGE HER2 COPY NUMBER PER CELL 1.95  Did patient present with symptoms (if so, please note symptoms) or was this found on screening mammography?: routine screening mammography on 11/19/20 showing a possible abnormality in the right breast  Past/Anticipated interventions by surgeon, if any:  Procedure(s): RIGHT BREAST LUMPECTOMY WITH RADIOACTIVE SEED LOCALIZATION AND DEEP RIGHT AXILLARY SENTINEL LYMPH NODE BIOPSY (Right) Surgeon(s) and Role:    Jovita Kussmaul, MD - Primary  Past/Anticipated interventions by medical oncology, if any: Dr Burr Medico recommend adjuvant endocrine therapy with Tamoxifen for 10 years   Lymphedema issues, if any:  no    Pain issues, if any:  no   SAFETY ISSUES: Prior radiation? no Pacemaker/ICD? no Possible current pregnancy?no Is the patient on methotrexate? no  Current Complaints / other details:  pain with reaching or stretching    Vitals:   03/06/21 0806  BP: 128/78  Pulse: 79  Resp: 18  Temp: (!) 97.5 F (36.4 C)  TempSrc: Temporal  SpO2: 100%  Weight: 194 lb 12.8 oz (88.4 kg)  Height: 4' 11.5" (1.511 m)

## 2021-02-28 ENCOUNTER — Inpatient Hospital Stay
Admission: RE | Admit: 2021-02-28 | Discharge: 2021-02-28 | Disposition: A | Discharge status: Home / Self Care | Payer: Self-pay | Source: Ambulatory Visit | Attending: Radiation Oncology | Admitting: Radiation Oncology

## 2021-02-28 ENCOUNTER — Ambulatory Visit
Admission: RE | Admit: 2021-02-28 | Discharge: 2021-02-28 | Disposition: A | Discharge status: Home / Self Care | Payer: Self-pay | Source: Ambulatory Visit | Attending: Radiation Oncology | Admitting: Radiation Oncology

## 2021-02-28 ENCOUNTER — Other Ambulatory Visit: Payer: Self-pay | Admitting: Radiation Oncology

## 2021-02-28 DIAGNOSIS — C50411 Malignant neoplasm of upper-outer quadrant of right female breast: Secondary | ICD-10-CM

## 2021-02-28 DIAGNOSIS — Z170 Estrogen receptor positive status [ER+]: Secondary | ICD-10-CM

## 2021-03-05 NOTE — Progress Notes (Signed)
Radiation Oncology         [phone] ________________________________  Name: Carla Patrick MRN: [id number]  Date: 03/06/2021  DOB: 21-Mar-1966  Re-Evaluation Note  CC: Deland Pretty, MD  Truitt Merle, MD    ICD-10-CM   1. Malignant neoplasm of upper-outer quadrant of right breast in female, estrogen receptor positive ([place])  C50.411 Ambulatory referral to Social Work   Z17.0       Diagnosis:   Stage IA (cT1b, cN0, cM0) Right Breast UOQ, Invasive Lobular Carcinoma with lobular neoplasia, ER+ / PR- / Her2-, Grade 2  Narrative:  The patient returns today to discuss radiation treatment options. She was seen in the multidisciplinary breast clinic on 12/26/20.   She opted to proceed with right breast lumpectomy with right axillary sentinel node biopsies on 01/30/21 under the care of Dr. Marlou Starks. Pathology from the procedure revealed: grade 2 invasive lobular carcinoma measuring 1.5 cm, with lobular neoplasia. Margins uninvolved by carcinoma (0.7 cm; inferior margin). 8/8 biopsied right axillary sentinel nodes negative for carcinoma, though 1/8 lymph nodes were positive for isolated tumor cells identified. Prognostic indicators significant for ER: 90% positive, with moderate staining intensity; PR: negative; Her2 status negative; Ki67 1%; Grade 2.  Oncotype DX was obtained on the final surgical sample and the recurrence score of 22 predicts a risk of recurrence outside the breast over the next 9 years of 8%, if the patient's only systemic therapy is an antiestrogen for 5 years.  It also predicts no benefit from chemotherapy.  Pertinent imaging since the patient was last seen includes: Bilateral breast MRI on 01/07/21 which demonstrated the known 1.4 x 1.2 cm spiculated enhancement at the mid depth right breast at the 12 o'clock position.  On review of systems, the patient denies any further pain in the right breast.  She denies any swelling in her right arm or hand.    Allergies:  has No Known  Allergies.  Meds: Current Outpatient Medications  Medication Sig Dispense Refill   cholecalciferol (VITAMIN D3) 25 MCG (1000 UNIT) tablet Take 1,000 Units by mouth daily.     Multiple Vitamins-Iron (MULTIVITAMINS WITH IRON) TABS tablet 1 tablet     PROAIR HFA 108 (90 BASE) MCG/ACT inhaler      rosuvastatin (CRESTOR) 20 MG tablet Take 20 mg by mouth daily.     Semaglutide-Weight Management 2.4 MG/0.75ML SOAJ INJECT 2.4MG SUBCUTANEOUSLY EVERY WEEK 3 mL 2   SYMBICORT 160-4.5 MCG/ACT inhaler SMARTSIG:2 Puff(s) By Mouth Twice Daily PRN     telmisartan (MICARDIS) 80 MG tablet Take 80 mg by mouth daily.     betamethasone, augmented, (DIPROLENE) 0.05 % lotion SMARTSIG:Sparingly Topical As Directed (Patient not taking: Reported on 03/06/2021)     buPROPion (WELLBUTRIN XL) 300 MG 24 hr tablet Take 300 mg by mouth daily. (Patient not taking: Reported on 38/06/8297)     folic acid (FOLVITE) 371 MCG tablet Take 400 mcg by mouth daily. (Patient not taking: Reported on 03/06/2021)     hydrochlorothiazide (HYDRODIURIL) 25 MG tablet Take 12.5 mg by mouth daily. (Patient not taking: Reported on 03/06/2021)     HYDROcodone-acetaminophen (NORCO/VICODIN) 5-325 MG tablet Take 1 tablet by mouth every 6 (six) hours as needed for moderate pain or severe pain. (Patient not taking: Reported on 03/06/2021) 15 tablet 0   No current facility-administered medications for this encounter.    Physical Findings: The patient is in no acute distress. Patient is alert and oriented.  height is 4' 11.5" (1.511 m) and weight  is 194 lb 12.8 oz (88.4 kg). Her temporal temperature is 97.5 F (36.4 C) (abnormal). Her blood pressure is 128/78 and her pulse is 79. Her respiration is 18 and oxygen saturation is 100%. Lungs are clear to auscultation bilaterally. Heart has regular rate and rhythm. No palpable cervical, supraclavicular, or axillary adenopathy. Abdomen soft, non-tender, normal bowel sounds.  Right breast: Well-healed lumpectomy  scar in the upper outer quadrant.  The patient also has a separate scar in the axillary region which is also healed well.  No signs of drainage or infection from the breast area.  No dominant mass palpable in the breast obvious signs of large seroma.  Lab Findings: Lab Results  Component Value Date   WBC 7.5 12/26/2020   HGB 11.6 (L) 12/26/2020   HCT 34.6 (L) 12/26/2020   MCV 96.1 12/26/2020   PLT 353 12/26/2020    Radiographic Findings: No results found.  Impression:  Stage IA (cT1b, cN0, cM0) Right Breast UOQ, Invasive Lobular Carcinoma with lobular neoplasia, ER+ / PR- / Her2-, Grade 2  Patient would be a good candidate for adjuvant radiation therapy as breast conserving surgery.  We discussed the general course of radiation therapy anticipated side effects and potential long-term toxicities of right breast radiation therapy in detail.  Patient appears to understand and wishes to proceed with planned course of treatment.  Plan:  Patient is scheduled for CT simulation next week.  Treatments began approximately 1 week after her simulation appointment.  Anticipate between 4 and 6 weeks of radiation therapy.  Patient will be treated with hypofractionated accelerated radiation therapy if technically possible.  -----------------------------------  Blair Promise, PhD, MD  This document serves as a record of services personally performed by Gery Pray, MD. It was created on his behalf by Roney Mans, a trained medical scribe. The creation of this record is based on the scribe's personal observations and the provider's statements to them. This document has been checked and approved by the attending provider.

## 2021-03-06 ENCOUNTER — Ambulatory Visit
Admission: RE | Admit: 2021-03-06 | Discharge: 2021-03-06 | Disposition: A | Discharge status: Home / Self Care | Payer: BC Managed Care – PPO | Source: Ambulatory Visit | Attending: Radiation Oncology | Admitting: Radiation Oncology

## 2021-03-06 ENCOUNTER — Encounter: Payer: Self-pay | Admitting: Radiation Oncology

## 2021-03-06 ENCOUNTER — Other Ambulatory Visit: Payer: Self-pay

## 2021-03-06 ENCOUNTER — Encounter: Payer: Self-pay | Admitting: *Deleted

## 2021-03-06 VITALS — BP 128/78 | HR 79 | Temp 97.5°F | Resp 18 | Ht 59.5 in | Wt 194.8 lb

## 2021-03-06 DIAGNOSIS — C50411 Malignant neoplasm of upper-outer quadrant of right female breast: Secondary | ICD-10-CM

## 2021-03-06 DIAGNOSIS — Z79899 Other long term (current) drug therapy: Secondary | ICD-10-CM | POA: Insufficient documentation

## 2021-03-06 DIAGNOSIS — Z170 Estrogen receptor positive status [ER+]: Secondary | ICD-10-CM | POA: Insufficient documentation

## 2021-03-06 NOTE — Progress Notes (Signed)
[facility] CSW Psychosocial Distress Screening  Social Work was referred by distress screening protocol.  The patient scored a 5 on the Psychosocial Distress Thermometer which indicates moderate distress. Social Work Theatre manager contacted patient by phone to assess for distress and other psychosocial needs.    ONCBCN DISTRESS SCREENING 03/06/2021  Screening Type Initial Screening  Distress experienced in past week (1-10) 5  Practical problem type Work/school  Emotional problem type Adjusting to illness  Spiritual/Religous concerns type Facing my mortality  Information Concerns Type   Physical Problem type Tingling hands/feet  Referral to support programs    Pt reports she is doing alright, just anxious to start new treatment and get it over with. Pt is hoping that treatment does not interfere with theater shows and one year wedding anniversary coming up this fall. Plans to take time during upcoming fall break to review health insurance and assess needs, and will call back with any concerns.  Pt is Chair of theater department at work, close with Chiropractor and dean who are both very supportive. There is a concern that multiple people who works on that side of building have had cancer diagnosis and pt plans to look into this more.  Pt reports she plans to go to the next Breast Cancer Support Group. She has contact information for Lorrin Jackson who she has been in touch with previously, and will call support services if any needs arise.  Rosary Lively, BSW Intern Supervised by Gwinda Maine, LCSW

## 2021-03-06 NOTE — Progress Notes (Signed)
See MD note for nursing evaluation. °

## 2021-03-11 ENCOUNTER — Ambulatory Visit
Admission: RE | Admit: 2021-03-11 | Discharge: 2021-03-11 | Disposition: A | Discharge status: Home / Self Care | Payer: BC Managed Care – PPO | Source: Ambulatory Visit | Attending: Radiation Oncology | Admitting: Radiation Oncology

## 2021-03-11 ENCOUNTER — Other Ambulatory Visit: Payer: Self-pay

## 2021-03-11 DIAGNOSIS — I890 Lymphedema, not elsewhere classified: Secondary | ICD-10-CM | POA: Diagnosis present

## 2021-03-11 DIAGNOSIS — Z510 Encounter for antineoplastic radiation therapy: Secondary | ICD-10-CM | POA: Insufficient documentation

## 2021-03-11 DIAGNOSIS — Z170 Estrogen receptor positive status [ER+]: Secondary | ICD-10-CM | POA: Diagnosis present

## 2021-03-11 DIAGNOSIS — M25611 Stiffness of right shoulder, not elsewhere classified: Secondary | ICD-10-CM | POA: Diagnosis present

## 2021-03-11 DIAGNOSIS — C50411 Malignant neoplasm of upper-outer quadrant of right female breast: Secondary | ICD-10-CM | POA: Insufficient documentation

## 2021-03-11 DIAGNOSIS — R293 Abnormal posture: Secondary | ICD-10-CM | POA: Diagnosis present

## 2021-03-11 DIAGNOSIS — Z483 Aftercare following surgery for neoplasm: Secondary | ICD-10-CM | POA: Diagnosis present

## 2021-03-14 ENCOUNTER — Encounter: Payer: Self-pay | Admitting: *Deleted

## 2021-03-18 ENCOUNTER — Ambulatory Visit
Admission: RE | Admit: 2021-03-18 | Discharge: 2021-03-18 | Disposition: A | Discharge status: Home / Self Care | Payer: BC Managed Care – PPO | Source: Ambulatory Visit | Attending: Radiation Oncology | Admitting: Radiation Oncology

## 2021-03-18 ENCOUNTER — Other Ambulatory Visit: Payer: Self-pay

## 2021-03-18 DIAGNOSIS — C50411 Malignant neoplasm of upper-outer quadrant of right female breast: Secondary | ICD-10-CM

## 2021-03-18 DIAGNOSIS — Z170 Estrogen receptor positive status [ER+]: Secondary | ICD-10-CM

## 2021-03-19 ENCOUNTER — Ambulatory Visit
Admission: RE | Admit: 2021-03-19 | Discharge: 2021-03-19 | Disposition: A | Discharge status: Home / Self Care | Payer: BC Managed Care – PPO | Source: Ambulatory Visit | Attending: Radiation Oncology | Admitting: Radiation Oncology

## 2021-03-19 DIAGNOSIS — C50411 Malignant neoplasm of upper-outer quadrant of right female breast: Secondary | ICD-10-CM

## 2021-03-19 DIAGNOSIS — Z170 Estrogen receptor positive status [ER+]: Secondary | ICD-10-CM

## 2021-03-19 MED ORDER — ALRA NON-METALLIC DEODORANT (RAD-ONC)
1.0000 "application " | Freq: Once | TOPICAL | Status: AC
  Administered 2021-03-19: 1 via TOPICAL

## 2021-03-19 MED ORDER — RADIAPLEXRX EX GEL: Freq: Once | CUTANEOUS | Status: AC

## 2021-03-19 NOTE — Progress Notes (Signed)
Pt here for patient teaching.    Pt given Radiation and You booklet, skin care instructions, Alra deodorant, and Radiaplex gel.    Reviewed areas of pertinence such as fatigue, nausea and vomiting, skin changes, breast tenderness, and breast swelling .   Pt able to give teach back of to pat skin, use unscented/gentle soap, and use baby wipes,apply Radiaplex bid, avoid applying anything to skin within 4 hours of treatment, avoid wearing an under wire bra, and to use an electric razor if they must shave.   Pt verbalizes understanding of information given and will contact nursing with any questions or concerns.    [email or web address]

## 2021-03-20 ENCOUNTER — Other Ambulatory Visit: Payer: Self-pay

## 2021-03-20 ENCOUNTER — Ambulatory Visit
Admission: RE | Admit: 2021-03-20 | Discharge: 2021-03-20 | Disposition: A | Discharge status: Home / Self Care | Payer: BC Managed Care – PPO | Source: Ambulatory Visit | Attending: Radiation Oncology | Admitting: Radiation Oncology

## 2021-03-20 DIAGNOSIS — C50411 Malignant neoplasm of upper-outer quadrant of right female breast: Secondary | ICD-10-CM | POA: Diagnosis not present

## 2021-03-21 ENCOUNTER — Ambulatory Visit
Admission: RE | Admit: 2021-03-21 | Discharge: 2021-03-21 | Disposition: A | Discharge status: Home / Self Care | Payer: BC Managed Care – PPO | Source: Ambulatory Visit | Attending: Radiation Oncology | Admitting: Radiation Oncology

## 2021-03-21 DIAGNOSIS — C50411 Malignant neoplasm of upper-outer quadrant of right female breast: Secondary | ICD-10-CM | POA: Diagnosis not present

## 2021-03-22 ENCOUNTER — Other Ambulatory Visit: Payer: Self-pay

## 2021-03-22 ENCOUNTER — Ambulatory Visit
Admission: RE | Admit: 2021-03-22 | Discharge: 2021-03-22 | Disposition: A | Discharge status: Home / Self Care | Payer: BC Managed Care – PPO | Source: Ambulatory Visit | Attending: Radiation Oncology | Admitting: Radiation Oncology

## 2021-03-22 DIAGNOSIS — C50411 Malignant neoplasm of upper-outer quadrant of right female breast: Secondary | ICD-10-CM | POA: Diagnosis not present

## 2021-03-25 ENCOUNTER — Ambulatory Visit
Admission: RE | Admit: 2021-03-25 | Discharge: 2021-03-25 | Disposition: A | Discharge status: Home / Self Care | Payer: BC Managed Care – PPO | Source: Ambulatory Visit | Attending: Radiation Oncology | Admitting: Radiation Oncology

## 2021-03-25 ENCOUNTER — Other Ambulatory Visit: Payer: Self-pay

## 2021-03-25 DIAGNOSIS — C50411 Malignant neoplasm of upper-outer quadrant of right female breast: Secondary | ICD-10-CM | POA: Diagnosis not present

## 2021-03-26 ENCOUNTER — Other Ambulatory Visit: Payer: Self-pay

## 2021-03-26 ENCOUNTER — Ambulatory Visit
Admission: RE | Admit: 2021-03-26 | Discharge: 2021-03-26 | Disposition: A | Discharge status: Home / Self Care | Payer: BC Managed Care – PPO | Source: Ambulatory Visit | Attending: Radiation Oncology | Admitting: Radiation Oncology

## 2021-03-26 DIAGNOSIS — C50411 Malignant neoplasm of upper-outer quadrant of right female breast: Secondary | ICD-10-CM | POA: Diagnosis not present

## 2021-03-27 ENCOUNTER — Ambulatory Visit
Admission: RE | Admit: 2021-03-27 | Discharge: 2021-03-27 | Disposition: A | Discharge status: Home / Self Care | Payer: BC Managed Care – PPO | Source: Ambulatory Visit | Attending: Radiation Oncology | Admitting: Radiation Oncology

## 2021-03-27 DIAGNOSIS — C50411 Malignant neoplasm of upper-outer quadrant of right female breast: Secondary | ICD-10-CM | POA: Diagnosis not present

## 2021-03-28 ENCOUNTER — Other Ambulatory Visit: Payer: Self-pay

## 2021-03-28 ENCOUNTER — Ambulatory Visit
Admission: RE | Admit: 2021-03-28 | Discharge: 2021-03-28 | Disposition: A | Discharge status: Home / Self Care | Payer: BC Managed Care – PPO | Source: Ambulatory Visit | Attending: Radiation Oncology | Admitting: Radiation Oncology

## 2021-03-28 DIAGNOSIS — C50411 Malignant neoplasm of upper-outer quadrant of right female breast: Secondary | ICD-10-CM | POA: Diagnosis not present

## 2021-03-29 ENCOUNTER — Ambulatory Visit
Admission: RE | Admit: 2021-03-29 | Discharge: 2021-03-29 | Disposition: A | Discharge status: Home / Self Care | Payer: BC Managed Care – PPO | Source: Ambulatory Visit | Attending: Radiation Oncology | Admitting: Radiation Oncology

## 2021-03-29 DIAGNOSIS — C50411 Malignant neoplasm of upper-outer quadrant of right female breast: Secondary | ICD-10-CM | POA: Diagnosis not present

## 2021-04-01 ENCOUNTER — Encounter: Payer: Self-pay | Admitting: Physical Therapy

## 2021-04-01 ENCOUNTER — Other Ambulatory Visit: Payer: Self-pay

## 2021-04-01 ENCOUNTER — Ambulatory Visit: Payer: BC Managed Care – PPO | Attending: General Surgery | Admitting: Physical Therapy

## 2021-04-01 ENCOUNTER — Ambulatory Visit
Admission: RE | Admit: 2021-04-01 | Discharge: 2021-04-01 | Disposition: A | Discharge status: Home / Self Care | Payer: BC Managed Care – PPO | Source: Ambulatory Visit | Attending: Radiation Oncology | Admitting: Radiation Oncology

## 2021-04-01 DIAGNOSIS — I890 Lymphedema, not elsewhere classified: Secondary | ICD-10-CM | POA: Insufficient documentation

## 2021-04-01 DIAGNOSIS — Z483 Aftercare following surgery for neoplasm: Secondary | ICD-10-CM | POA: Insufficient documentation

## 2021-04-01 DIAGNOSIS — C50411 Malignant neoplasm of upper-outer quadrant of right female breast: Secondary | ICD-10-CM | POA: Insufficient documentation

## 2021-04-01 DIAGNOSIS — Z170 Estrogen receptor positive status [ER+]: Secondary | ICD-10-CM | POA: Insufficient documentation

## 2021-04-01 DIAGNOSIS — R293 Abnormal posture: Secondary | ICD-10-CM | POA: Insufficient documentation

## 2021-04-01 DIAGNOSIS — M25611 Stiffness of right shoulder, not elsewhere classified: Secondary | ICD-10-CM | POA: Insufficient documentation

## 2021-04-01 NOTE — Therapy (Signed)
[facility] @ [place], [place], [address] Phone: [phone]   Fax:  [phone]  Physical Therapy Treatment  Patient Details  Name: Carla Patrick MRN: [id number] Date of Birth: Nov 26, 1965 Referring Provider (PT): Dr. Autumn Messing   Encounter Date: 04/01/2021   PT End of Session - 04/01/21 1045     Visit Number 3    Number of Visits 11    Date for PT Re-Evaluation 04/29/21    PT Start Time 0950    PT Stop Time 1045    PT Time Calculation (min) 55 min    Activity Tolerance Patient tolerated treatment well    Behavior During Therapy [facility] for tasks assessed/performed             Past Medical History:  Diagnosis Date   Abnormal EKG    Anemia    Anxiety    Asthma    Cancer ([place]) 11/2020   right breast [facility]   Depression    Heart murmur    Hyperlipidemia    Hypertension     Past Surgical History:  Procedure Laterality Date   BREAST EXCISIONAL BIOPSY  2007   left   BREAST LUMPECTOMY WITH RADIOACTIVE SEED AND SENTINEL LYMPH NODE BIOPSY Right 01/30/2021   Procedure: RIGHT BREAST LUMPECTOMY WITH RADIOACTIVE SEED AND SENTINEL LYMPH NODE BIOPSY;  Surgeon: Jovita Kussmaul, MD;  Location: [facility];  Service: General;  Laterality: Right;   CHOLECYSTECTOMY  10/04/2011   Procedure: LAPAROSCOPIC CHOLECYSTECTOMY WITH INTRAOPERATIVE CHOLANGIOGRAM;  Surgeon: Rolm Bookbinder, MD;  Location: [facility];  Service: General;  Laterality: N/A;    There were no vitals filed for this visit.   Subjective Assessment - 04/01/21 0952     Subjective Patient reports she has completed 11 radiation treatments so far. She reports she is not getting radiation to her axilla. She reports she still has cording and it has worsened since her last visit. My ROM is probably better.    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 She underwent a right  lumpectomy and sentinel node biopsy (1 of 8 nodes positive) on 01/30/2021 It is ER positive, PR negative, andHER2 negative with a Ki67 of 1%.    Patient Stated Goals Get rid of cording    Currently in Pain? Yes    Pain Score 4     Pain Location Axilla    Pain Orientation Right    Pain Descriptors / Indicators Tightness;Other (Comment)   pulling   Pain Type Surgical pain    Pain Onset More than a month ago    Pain Frequency Intermittent    Aggravating Factors  Reaching up    Pain Relieving Factors Resting                [facility] PT Assessment - 04/01/21 0001       Assessment   Medical Diagnosis s/p right lumpectomy and SLNB    Referring Provider (PT) Dr. Autumn Messing    Onset Date/Surgical Date 01/30/21    Hand Dominance Right    Prior Therapy Post op assessment      Precautions   Precautions Other (comment)    Precaution Comments right arm lymphedema risk      Restrictions   Weight Bearing Restrictions No      Balance Screen   Has the patient fallen in the past 6 months No    Has the  patient had a decrease in activity level because of a fear of falling?  No    Is the patient reluctant to leave their home because of a fear of falling?  No      Home Environment   Living Environment Private residence    Living Arrangements Spouse/significant other    Available Help at Discharge Family      Prior Function   Level of Independence Independent    Vocation Full time employment    Geneticist, molecular professor at [employer] She is walking about 15 minutes per day      Cognition   Overall Cognitive Status Within Functional Limits for tasks assessed      Observation/Other Assessments   Observations Right breast with significant edema and hardness present in inferior aspect. Applied compression foam inside bra.      Posture/Postural Control   Posture/Postural Control Postural limitations    Postural Limitations Rounded Shoulders;Forward head       ROM / Strength   AROM / PROM / Strength AROM      AROM   AROM Assessment Site Shoulder    Right/Left Shoulder Right    Right Shoulder Extension 48 Degrees    Right Shoulder Flexion 151 Degrees    Right Shoulder ABduction 143 Degrees    Right Shoulder Internal Rotation 64 Degrees    Right Shoulder External Rotation 76 Degrees               LYMPHEDEMA/ONCOLOGY QUESTIONNAIRE - 04/01/21 0001       Lymphedema Assessments   Lymphedema Assessments Upper extremities      Right Upper Extremity Lymphedema   10 cm Proximal to Olecranon Process 38.8 cm    Olecranon Process 27.5 cm    10 cm Proximal to Ulnar Styloid Process 24.3 cm    Just Proximal to Ulnar Styloid Process 16.6 cm    Across Hand at [employer] 19 cm    At [place] of 2nd Digit 6.5 cm      Left Upper Extremity Lymphedema   10 cm Proximal to Olecranon Process 37.1 cm    Olecranon Process 25.4 cm    10 cm Proximal to Ulnar Styloid Process 23.5 cm    Just Proximal to Ulnar Styloid Process 15.4 cm    Across Hand at [employer] 18 cm    At [place] of 2nd Digit 6.4 cm             L-DEX FLOWSHEETS - 04/01/21 1000       L-DEX LYMPHEDEMA SCREENING   Measurement Type Unilateral    L-DEX MEASUREMENT EXTREMITY Upper Extremity    POSITION  Standing    DOMINANT SIDE Left    At Risk Side Right    BASELINE SCORE (UNILATERAL) 13    L-DEX SCORE (UNILATERAL) 28.1    VALUE CHANGE (UNILAT) 15.1    Comment Issued size 3 gauntlet and size IV sleeve Class I                                PT Education - 04/01/21 1043     Education Details Where and how to be fitted with a compression bra, sleeve and glove.    Person(s) Educated Patient    Methods Explanation;Demonstration;Handout    Comprehension Returned demonstration;Verbalized understanding  PT Long Term Goals - 04/01/21 1054       PT LONG TERM GOAL #1   Title Patient will demonstrate she has regained shoulder  ROM and function post operatively compared to baselines.    Time 4    Period Weeks    Status On-going    Target Date 04/29/21      PT LONG TERM GOAL #2   Title Patient will demonstrate right shoulder flexion and abduction to >/= 150 degrees for increased ease reaching.    Time 4    Period Weeks    Status New    Target Date 04/29/21      PT LONG TERM GOAL #3   Title Patient will report >/= 25% improvement in right breast swelling.    Time 4    Period Weeks    Status New    Target Date 04/29/21      PT LONG TERM GOAL #4   Title Patient will improve right arm swelling at olecranon by >/= 1 cm.    Time 4    Period Weeks    Status New    Target Date 04/29/21      PT LONG TERM GOAL #5   Title Patient will be independent with self manual lymph drainage for right breast and arm.    Time 4    Period Weeks    Status New    Target Date 04/29/21                   Plan - 04/01/21 1049     Clinical Impression Statement Patient is here today to f/u since her post op reassessment to have her axillary cording assessed. She continues to have prominent cording although her shoulder ROM has improved. She now has significant right breast edema with hardness present in the inferior aspect and shows signs of right arm lymphedema with an increase of 15.1 on her SOZO screen. A compression gauntlet and sleeve (Class I) were issued and she was instructed to purchase a compression bra. She will benefit from PT to reduce axillary cording and manager her right breast and arm swelling. She is currently in the middle of radiation so she may not tolerate manual lymph drainage on her breast after another week or so.    PT Frequency 2x / week    PT Duration 4 weeks    PT Treatment/Interventions ADLs/Self Care Home Management;Patient/family education;Therapeutic exercise;Manual techniques;Manual lymph drainage;Passive range of motion    PT Next Visit Plan Begin manual techniques for right axillary  cording and begin manual lymph drainage for right arm and breast.    Consulted and Agree with Plan of Care Patient             Patient will benefit from skilled therapeutic intervention in order to improve the following deficits and impairments:  Postural dysfunction, Decreased range of motion, Decreased knowledge of precautions, Impaired UE functional use, Pain, Increased edema, Increased fascial restricitons  Visit Diagnosis: Malignant neoplasm of upper-outer quadrant of right breast in female, estrogen receptor positive ([place]) - Plan: PT plan of care cert/re-cert  Abnormal posture - Plan: PT plan of care cert/re-cert  Aftercare following surgery for neoplasm - Plan: PT plan of care cert/re-cert  Stiffness of right shoulder, not elsewhere classified - Plan: PT plan of care cert/re-cert  Lymphedema, not elsewhere classified - Plan: PT plan of care cert/re-cert     Problem List Patient Active Problem List   Diagnosis Date Noted  Malignant neoplasm of upper-outer quadrant of right breast in female, estrogen receptor positive ([place]) 12/24/2020   Right knee pain 11/08/2013   Abnormal EKG 07/15/2013   Annia Friendly, PT 04/01/21 10:58 AM   [facility] @ [place], [place], [address] Phone: [phone]   Fax:  [phone]  Name: JOURNI MOFFA MRN: [id number] Date of Birth: 18-Sep-1965

## 2021-04-02 ENCOUNTER — Ambulatory Visit: Payer: BC Managed Care – PPO | Admitting: Radiation Oncology

## 2021-04-02 ENCOUNTER — Ambulatory Visit
Admission: RE | Admit: 2021-04-02 | Discharge: 2021-04-02 | Disposition: A | Discharge status: Home / Self Care | Payer: BC Managed Care – PPO | Source: Ambulatory Visit | Attending: Radiation Oncology | Admitting: Radiation Oncology

## 2021-04-02 DIAGNOSIS — Z510 Encounter for antineoplastic radiation therapy: Secondary | ICD-10-CM | POA: Insufficient documentation

## 2021-04-02 DIAGNOSIS — C50411 Malignant neoplasm of upper-outer quadrant of right female breast: Secondary | ICD-10-CM | POA: Insufficient documentation

## 2021-04-03 ENCOUNTER — Other Ambulatory Visit: Payer: Self-pay

## 2021-04-03 ENCOUNTER — Ambulatory Visit
Admission: RE | Admit: 2021-04-03 | Discharge: 2021-04-03 | Disposition: A | Discharge status: Home / Self Care | Payer: BC Managed Care – PPO | Source: Ambulatory Visit | Attending: Radiation Oncology | Admitting: Radiation Oncology

## 2021-04-03 DIAGNOSIS — Z510 Encounter for antineoplastic radiation therapy: Secondary | ICD-10-CM | POA: Diagnosis not present

## 2021-04-04 ENCOUNTER — Ambulatory Visit
Admission: RE | Admit: 2021-04-04 | Discharge: 2021-04-04 | Disposition: A | Discharge status: Home / Self Care | Payer: BC Managed Care – PPO | Source: Ambulatory Visit | Attending: Radiation Oncology | Admitting: Radiation Oncology

## 2021-04-04 DIAGNOSIS — Z510 Encounter for antineoplastic radiation therapy: Secondary | ICD-10-CM | POA: Diagnosis not present

## 2021-04-05 ENCOUNTER — Ambulatory Visit: Payer: BC Managed Care – PPO

## 2021-04-05 ENCOUNTER — Ambulatory Visit
Admission: RE | Admit: 2021-04-05 | Discharge: 2021-04-05 | Disposition: A | Discharge status: Home / Self Care | Payer: BC Managed Care – PPO | Source: Ambulatory Visit | Attending: Radiation Oncology | Admitting: Radiation Oncology

## 2021-04-05 ENCOUNTER — Other Ambulatory Visit: Payer: Self-pay

## 2021-04-05 DIAGNOSIS — Z510 Encounter for antineoplastic radiation therapy: Secondary | ICD-10-CM | POA: Diagnosis not present

## 2021-04-08 ENCOUNTER — Ambulatory Visit: Payer: BC Managed Care – PPO

## 2021-04-08 ENCOUNTER — Other Ambulatory Visit: Payer: Self-pay

## 2021-04-08 ENCOUNTER — Ambulatory Visit
Admission: RE | Admit: 2021-04-08 | Discharge: 2021-04-08 | Disposition: A | Discharge status: Home / Self Care | Payer: BC Managed Care – PPO | Source: Ambulatory Visit | Attending: Radiation Oncology | Admitting: Radiation Oncology

## 2021-04-08 DIAGNOSIS — Z510 Encounter for antineoplastic radiation therapy: Secondary | ICD-10-CM | POA: Diagnosis not present

## 2021-04-09 ENCOUNTER — Ambulatory Visit: Payer: BC Managed Care – PPO

## 2021-04-09 ENCOUNTER — Ambulatory Visit
Admission: RE | Admit: 2021-04-09 | Discharge: 2021-04-09 | Disposition: A | Discharge status: Home / Self Care | Payer: BC Managed Care – PPO | Source: Ambulatory Visit | Attending: Radiation Oncology | Admitting: Radiation Oncology

## 2021-04-09 DIAGNOSIS — Z510 Encounter for antineoplastic radiation therapy: Secondary | ICD-10-CM | POA: Diagnosis not present

## 2021-04-10 ENCOUNTER — Ambulatory Visit: Payer: BC Managed Care – PPO

## 2021-04-10 ENCOUNTER — Other Ambulatory Visit: Payer: Self-pay

## 2021-04-10 ENCOUNTER — Ambulatory Visit
Admission: RE | Admit: 2021-04-10 | Discharge: 2021-04-10 | Disposition: A | Discharge status: Home / Self Care | Payer: BC Managed Care – PPO | Source: Ambulatory Visit | Attending: Radiation Oncology | Admitting: Radiation Oncology

## 2021-04-10 DIAGNOSIS — Z510 Encounter for antineoplastic radiation therapy: Secondary | ICD-10-CM | POA: Diagnosis not present

## 2021-04-11 ENCOUNTER — Ambulatory Visit: Payer: BC Managed Care – PPO

## 2021-04-11 ENCOUNTER — Ambulatory Visit
Admission: RE | Admit: 2021-04-11 | Discharge: 2021-04-11 | Disposition: A | Discharge status: Home / Self Care | Payer: BC Managed Care – PPO | Source: Ambulatory Visit | Attending: Radiation Oncology | Admitting: Radiation Oncology

## 2021-04-11 DIAGNOSIS — Z510 Encounter for antineoplastic radiation therapy: Secondary | ICD-10-CM | POA: Diagnosis not present

## 2021-04-12 ENCOUNTER — Ambulatory Visit: Payer: BC Managed Care – PPO

## 2021-04-15 ENCOUNTER — Ambulatory Visit: Payer: BC Managed Care – PPO

## 2021-04-15 ENCOUNTER — Ambulatory Visit
Admission: RE | Admit: 2021-04-15 | Discharge: 2021-04-15 | Disposition: A | Discharge status: Home / Self Care | Payer: BC Managed Care – PPO | Source: Ambulatory Visit | Attending: Radiation Oncology | Admitting: Radiation Oncology

## 2021-04-15 ENCOUNTER — Encounter: Payer: Self-pay | Admitting: Radiation Oncology

## 2021-04-15 DIAGNOSIS — Z510 Encounter for antineoplastic radiation therapy: Secondary | ICD-10-CM | POA: Diagnosis not present

## 2021-04-16 ENCOUNTER — Ambulatory Visit: Payer: BC Managed Care – PPO

## 2021-04-17 ENCOUNTER — Ambulatory Visit: Payer: BC Managed Care – PPO

## 2021-04-18 ENCOUNTER — Ambulatory Visit: Payer: BC Managed Care – PPO

## 2021-04-19 ENCOUNTER — Encounter: Payer: Self-pay | Admitting: Rehabilitation

## 2021-04-19 ENCOUNTER — Ambulatory Visit: Payer: BC Managed Care – PPO

## 2021-04-19 ENCOUNTER — Other Ambulatory Visit: Payer: Self-pay

## 2021-04-19 ENCOUNTER — Ambulatory Visit: Payer: BC Managed Care – PPO | Attending: General Surgery | Admitting: Rehabilitation

## 2021-04-19 DIAGNOSIS — Z170 Estrogen receptor positive status [ER+]: Secondary | ICD-10-CM | POA: Insufficient documentation

## 2021-04-19 DIAGNOSIS — M25611 Stiffness of right shoulder, not elsewhere classified: Secondary | ICD-10-CM | POA: Insufficient documentation

## 2021-04-19 DIAGNOSIS — I890 Lymphedema, not elsewhere classified: Secondary | ICD-10-CM | POA: Insufficient documentation

## 2021-04-19 DIAGNOSIS — C50411 Malignant neoplasm of upper-outer quadrant of right female breast: Secondary | ICD-10-CM | POA: Insufficient documentation

## 2021-04-19 DIAGNOSIS — R293 Abnormal posture: Secondary | ICD-10-CM | POA: Insufficient documentation

## 2021-04-19 DIAGNOSIS — Z483 Aftercare following surgery for neoplasm: Secondary | ICD-10-CM | POA: Diagnosis present

## 2021-04-19 NOTE — Therapy (Addendum)
[facility] @ [place], [place], [address] Phone: [phone]   Fax:  [phone]  Physical Therapy Treatment  Patient Details  Name: Carla Patrick MRN: [id number] Date of Birth: July 31, 1965 Referring Provider (PT): Dr. Autumn Messing   Encounter Date: 04/19/2021   PT End of Session - 04/19/21 1054     Visit Number 4    Number of Visits 11    Date for PT Re-Evaluation 04/29/21    PT Start Time 1008    PT Stop Time 4709    PT Time Calculation (min) 44 min    Activity Tolerance Patient tolerated treatment well;Treatment limited secondary to medical complications (Comment)    Behavior During Therapy [facility] for tasks assessed/performed             Past Medical History:  Diagnosis Date   Abnormal EKG    Anemia    Anxiety    Asthma    Cancer ([place]) 11/2020   right breast [facility]   Depression    Heart murmur    Hyperlipidemia    Hypertension     Past Surgical History:  Procedure Laterality Date   BREAST EXCISIONAL BIOPSY  2007   left   BREAST LUMPECTOMY WITH RADIOACTIVE SEED AND SENTINEL LYMPH NODE BIOPSY Right 01/30/2021   Procedure: RIGHT BREAST LUMPECTOMY WITH RADIOACTIVE SEED AND SENTINEL LYMPH NODE BIOPSY;  Surgeon: Jovita Kussmaul, MD;  Location: [facility];  Service: General;  Laterality: Right;   CHOLECYSTECTOMY  10/04/2011   Procedure: LAPAROSCOPIC CHOLECYSTECTOMY WITH INTRAOPERATIVE CHOLANGIOGRAM;  Surgeon: Rolm Bookbinder, MD;  [facility];  Service: General;  Laterality: N/A;    There were no vitals filed for this visit.   Subjective Assessment - 04/19/21 1009     Subjective cording is still there but not limiting my motion.  I have been wearing my arm sleeve but left it at work today. 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 She underwent a right lumpectomy and  sentinel node biopsy (1 of 8 nodes positive) on 01/30/2021 It is ER positive, PR negative, andHER2 negative with a Ki67 of 1%.    Currently in Pain? Yes    Pain Score 4     Pain Location Breast    Pain Orientation Right    Pain Descriptors / Indicators Burning    Pain Type Acute pain    Pain Onset In the past 7 days    Pain Frequency Constant                               [facility] Adult PT Treatment/Exercise - 04/19/21 0001       Manual Therapy   Manual Therapy Soft tissue mobilization    Manual therapy comments sent note to Shona Simpson regarding skin recommendations but told pt they usually give you silvadene and telfa pads    Soft tissue mobilization STM to the upper arm following the cord from axila to mid upper arm with arm in radiation position, also more MFR to the axilla opposing pressure and S movements                          PT Long Term Goals - 04/01/21 1054       PT LONG  TERM GOAL #1   Title Patient will demonstrate she has regained shoulder ROM and function post operatively compared to baselines.    Time 4    Period Weeks    Status On-going    Target Date 04/29/21      PT LONG TERM GOAL #2   Title Patient will demonstrate right shoulder flexion and abduction to >/= 150 degrees for increased ease reaching.    Time 4    Period Weeks    Status New    Target Date 04/29/21      PT LONG TERM GOAL #3   Title Patient will report >/= 25% improvement in right breast swelling.    Time 4    Period Weeks    Status New    Target Date 04/29/21      PT LONG TERM GOAL #4   Title Patient will improve right arm swelling at olecranon by >/= 1 cm.    Time 4    Period Weeks    Status New    Target Date 04/29/21      PT LONG TERM GOAL #5   Title Patient will be independent with self manual lymph drainage for right breast and arm.    Time 4    Period Weeks    Status New    Target Date 04/29/21                   Plan -  04/19/21 1054     Clinical Impression Statement Pt with mod to severe wet desquamation and sloughing skin.  Pt has not been instructed on any skin care so a note was sent to radiation and pt was told to expect a follow up.  Pt was able to tolerate working on the cording which softened during MT.  Reminded her to make sure the bra does not dig into the Rt side as it is doing today and to return to stretches as able.    PT Frequency 2x / week    PT Duration 4 weeks    PT Treatment/Interventions ADLs/Self Care Home Management;Patient/family education;Therapeutic exercise;Manual techniques;Manual lymph drainage;Passive range of motion    PT Next Visit Plan *SOZO recheck from sleeve wear* how is skin? manual techniques for right axillary cording and begin manual lymph drainage for right arm and breast.    Consulted and Agree with Plan of Care Patient             Patient will benefit from skilled therapeutic intervention in order to improve the following deficits and impairments:     Visit Diagnosis: Malignant neoplasm of upper-outer quadrant of right breast in female, estrogen receptor positive ([place])  Abnormal posture  Aftercare following surgery for neoplasm  Stiffness of right shoulder, not elsewhere classified  Lymphedema, not elsewhere classified     Problem List Patient Active Problem List   Diagnosis Date Noted   Malignant neoplasm of upper-outer quadrant of right breast in female, estrogen receptor positive ([place]) 12/24/2020   Right knee pain 11/08/2013   Abnormal EKG 07/15/2013    Stark Bray, PT 04/19/2021, 10:57 AM  [facility] @ [place], [place], [address] Phone: [phone]   Fax:  [phone]  Name: Carla Patrick MRN: [id number] Date of Birth: 04/30/1966   PHYSICAL THERAPY DISCHARGE SUMMARY  Visits from Start of Care: 4  Current functional level related to goals / functional  outcomes: Pt did not return after last visit  Remaining deficits: See above   Education / Equipment: Sleeve instruction  Plan: Patient agrees to discharge.  Patient goals were not met.

## 2021-04-22 ENCOUNTER — Ambulatory Visit: Payer: BC Managed Care – PPO

## 2021-04-22 NOTE — Progress Notes (Incomplete)
  Radiation Oncology         [phone] ________________________________  Patient Name: Carla Patrick MRN: [id number] DOB: Jun 12, 1965 Referring Physician: Deland Pretty (Profile Not Attached) Date of Service: 04/15/2021 [facility], [place]                                                        End Of Treatment Note  Diagnoses: C50.411-Malignant neoplasm of upper-outer quadrant of right female breast  Cancer Staging: Stage IA (cT1b, cN0, cM0) Right Breast UOQ, Invasive Lobular Carcinoma with lobular neoplasia, ER+ / PR- / Her2-, Grade 2  Intent: Curative  Radiation Treatment Dates: 03/18/2021 through 04/15/2021 Site Technique Total Dose (Gy) Dose per Fx (Gy) Completed Fx Beam Energies  Breast, Right: Breast_Rt 3D 40.05/40.05 2.67 15/15 10X  Breast, Right: Breast_Rt_Bst 3D 10/10 2 5/5 6X, 10X   Narrative: Per the patient's weekly treatment check on 04/09/21, she reports having shooting pains to right breast as well as mild fatigue. She continues to have swelling and tenderness and a hardened area to right breast and cording to her right axilla. She reports mild skin changes and continues to use Radiaplex as directed.  On physical exam: the right breast area shows hyperpigmentation changes and edema particularly in the central aspect of the breast.  No signs of infection within the breast or skin breakdown seen.  Plan: The patient will follow-up with radiation oncology in one month.   ________________________________________________ -----------------------------------  Blair Promise, PhD, MD  This document serves as a record of services personally performed by Gery Pray, MD. It was created on his behalf by Roney Mans, a trained medical scribe. The creation of this record is based on the scribe's personal observations and the provider's statements to them. This document has been checked and approved by the attending provider.

## 2021-04-22 NOTE — Progress Notes (Signed)
Radiation Oncology         [phone] ________________________________  Name: Carla Patrick MRN: [id number]  Date: 04/23/2021  DOB: [birth year]  Follow-Up Visit Note  CC: Deland Pretty, MD  Deland Pretty, MD    ICD-10-CM   1. Malignant neoplasm of upper-outer quadrant of right breast in female, estrogen receptor positive ([place])  C50.411    Z17.0       Diagnosis:  Stage IA (cT1b, cN0, cM0) Right Breast UOQ, Invasive Lobular Carcinoma with lobular neoplasia, ER+ / PR- / Her2-, Grade 2  Interval Since Last Radiation: 1 week and 1 day  Intent: Curative  Radiation Treatment Dates: 03/18/2021 through 04/15/2021 Site Technique Total Dose (Gy) Dose per Fx (Gy) Completed Fx Beam Energies  Breast, Right: Breast_Rt 3D 40.05/40.05 2.67 15/15 10X  Breast, Right: Breast_Rt_Bst 3D 10/10 2 5/5 6X, 10X    Narrative:  The patient returns today for routine follow-up/ skin check following her recent radiation therapy. To review from the date of her last weekly treatment check on 04/09/21, the patient reported shooting pains to her right breast as well as mild fatigue. She also continued to have swelling and tenderness and a hardened area to her right breast, and cording to her right axilla . Physical exam performed on 11/08 revealed the right breast area to show hyperpigmentation changes and edema particularly in the central aspect of the breast. No signs of infection within the breast or skin breakdown were seen at that time.  The patient was noted to have an area of moist desquamation in the axillary and inframammary fold by her physical therapist.  In light of this I recommend the patient come in for evaluation.  Patient overall the past few days has noticed significant provement in her discomfort in the right breast.  She reports self-medicating this area with cornstarch and Neosporin antibiotic ointment.  She denies any chills or fever.                                 Allergies:  has No  Known Allergies.  Meds: Current Outpatient Medications  Medication Sig Dispense Refill   betamethasone, augmented, (DIPROLENE) 0.05 % lotion      buPROPion (WELLBUTRIN XL) 300 MG 24 hr tablet Take 300 mg by mouth daily.     cholecalciferol (VITAMIN D3) 25 MCG (1000 UNIT) tablet Take 1,000 Units by mouth daily.     folic acid (FOLVITE) 034 MCG tablet Take 400 mcg by mouth daily.     hydrochlorothiazide (HYDRODIURIL) 25 MG tablet Take 12.5 mg by mouth daily.     HYDROcodone-acetaminophen (NORCO/VICODIN) 5-325 MG tablet Take 1 tablet by mouth every 6 (six) hours as needed for moderate pain or severe pain. 15 tablet 0   Multiple Vitamins-Iron (MULTIVITAMINS WITH IRON) TABS tablet 1 tablet     PROAIR HFA 108 (90 BASE) MCG/ACT inhaler      rosuvastatin (CRESTOR) 20 MG tablet Take 20 mg by mouth daily.     Semaglutide-Weight Management 2.4 MG/0.75ML SOAJ INJECT 2.4MG SUBCUTANEOUSLY EVERY WEEK 3 mL 2   SYMBICORT 160-4.5 MCG/ACT inhaler SMARTSIG:2 Puff(s) By Mouth Twice Daily PRN     telmisartan (MICARDIS) 80 MG tablet Take 80 mg by mouth daily.     No current facility-administered medications for this encounter.    Physical Findings: The patient is in no acute distress. Patient is alert and oriented.  height is 4' 11.5" (1.511 m) and  weight is 199 lb 8 oz (90.5 kg). Her temporal temperature is 96.5 F (35.8 C) (abnormal). Her blood pressure is 124/88 and her pulse is 88. Her respiration is 18 and oxygen saturation is 100%. .   Lungs are clear to auscultation bilaterally. Heart has regular rate and rhythm. No palpable cervical, supraclavicular, or axillary adenopathy. Abdomen soft, non-tender, normal bowel sounds.    Right Breast: Less swelling noted in the breast compared to my previous exam.  She has hyperpigmentation changes throughout the breast.  There is healing moist desquamation in the axillary and inframammary fold area.  No signs of infection.    Lab Findings: Lab Results  Component  Value Date   WBC 7.5 12/26/2020   HGB 11.6 (L) 12/26/2020   HCT 34.6 (L) 12/26/2020   MCV 96.1 12/26/2020   PLT 353 12/26/2020    Radiographic Findings: No results found.  Impression:  Stage IA (cT1b, cN0, cM0) Right Breast UOQ, Invasive Lobular Carcinoma with lobular neoplasia, ER+ / PR- / Her2-, Grade 2  The patient is recovering from the effects of radiation.  Patient has developed some moist desquamation in the axillary crease and inframammary fold area.  Plan: Recommended she continue using her skin creams given to her in the hyperpigmented areas.  She has been given a small jar of Silvadene to place twice a day in the inframammary fold and axillary region.  The patient will be seen in 3 months as she appears to be healing well at this time.  She will call sooner if she develops more issues in the meantime.    ____________________________________  Blair Promise, PhD, MD   This document serves as a record of services personally performed by Gery Pray, MD. It was created on his behalf by Roney Mans, a trained medical scribe. The creation of this record is based on the scribe's personal observations and the provider's statements to them. This document has been checked and approved by the attending provider.

## 2021-04-23 ENCOUNTER — Ambulatory Visit: Payer: BC Managed Care – PPO

## 2021-04-23 ENCOUNTER — Other Ambulatory Visit: Payer: Self-pay

## 2021-04-23 ENCOUNTER — Encounter: Payer: Self-pay | Admitting: Radiation Oncology

## 2021-04-23 ENCOUNTER — Ambulatory Visit: Payer: BC Managed Care – PPO | Admitting: Radiation Oncology

## 2021-04-23 ENCOUNTER — Ambulatory Visit
Admission: RE | Admit: 2021-04-23 | Discharge: 2021-04-23 | Disposition: A | Discharge status: Home / Self Care | Payer: BC Managed Care – PPO | Source: Ambulatory Visit | Attending: Radiation Oncology | Admitting: Radiation Oncology

## 2021-04-23 DIAGNOSIS — Z79899 Other long term (current) drug therapy: Secondary | ICD-10-CM | POA: Diagnosis not present

## 2021-04-23 DIAGNOSIS — C50411 Malignant neoplasm of upper-outer quadrant of right female breast: Secondary | ICD-10-CM | POA: Diagnosis not present

## 2021-04-23 DIAGNOSIS — Z170 Estrogen receptor positive status [ER+]: Secondary | ICD-10-CM | POA: Diagnosis not present

## 2021-04-23 DIAGNOSIS — Z923 Personal history of irradiation: Secondary | ICD-10-CM | POA: Insufficient documentation

## 2021-04-23 NOTE — Progress Notes (Signed)
Carla Patrick is here today for follow up post radiation to the breast.   Breast Side: Right   They completed their radiation on: 04/15/21  Does the patient complain of any of the following: Post radiation skin issues: Patient reports skin continues to be hyperpigmented. Continues to have mild peeling to right breast.  Breast Tenderness: yes Breast Swelling: yes Lymphadema: Patient currently in physical therapy due to cording to right axilla. Range of Motion limitations: full  Fatigue post radiation: continues to have mild fatigue.  Appetite good/fair/poor: Good  Additional comments if applicable:   Vitals:   04/23/21 1049  BP: 124/88  Pulse: 88  Resp: 18  Temp: (!) 96.5 F (35.8 C)  TempSrc: Temporal  SpO2: 100%  Weight: 199 lb 8 oz (90.5 kg)  Height: 4' 11.5" (1.511 m)

## 2021-04-24 ENCOUNTER — Ambulatory Visit: Payer: BC Managed Care – PPO

## 2021-04-29 ENCOUNTER — Other Ambulatory Visit: Payer: Self-pay

## 2021-04-29 ENCOUNTER — Inpatient Hospital Stay: Payer: BC Managed Care – PPO | Attending: Hematology | Admitting: Hematology

## 2021-04-29 ENCOUNTER — Encounter: Payer: Self-pay | Admitting: Hematology

## 2021-04-29 ENCOUNTER — Ambulatory Visit: Payer: BC Managed Care – PPO

## 2021-04-29 VITALS — BP 128/92 | HR 77 | Temp 98.1°F | Resp 18 | Ht 59.5 in | Wt 200.2 lb

## 2021-04-29 DIAGNOSIS — C50411 Malignant neoplasm of upper-outer quadrant of right female breast: Secondary | ICD-10-CM | POA: Diagnosis present

## 2021-04-29 DIAGNOSIS — Z170 Estrogen receptor positive status [ER+]: Secondary | ICD-10-CM | POA: Insufficient documentation

## 2021-04-29 MED ORDER — TAMOXIFEN CITRATE 20 MG PO TABS: 20.0000 mg | ORAL_TABLET | Freq: Every day | ORAL | 3 refills | Status: DC

## 2021-04-29 NOTE — Progress Notes (Signed)
Carla Patrick   Telephone:([phone]) [phone] Fax:(336) [phone]   Clinic Follow up Note   Patient Care Team: Deland Pretty, MD as PCP - General (Internal Medicine) Mauro Kaufmann, RN as Oncology Nurse Navigator Rockwell Germany, RN as Oncology Nurse Navigator Jovita Kussmaul, MD as Consulting Physician (General Surgery) Truitt Merle, MD as Consulting Physician (Hematology) Gery Pray, MD as Consulting Physician (Radiation Oncology)  Date of Service:  04/29/2021  CHIEF COMPLAINT: f/u of right breast cancer  CURRENT THERAPY:  PENDING Tamoxifen, to start 06/2021  ASSESSMENT & PLAN:  Carla Patrick is a 55 y.o. female with   1.  Malignant neoplasm of upper-outer quadrant of right breast, Invasive Lobular Carcinoma, Stage IA, p(T1c, N0(i+)), ER+/PR-/HER2-, Grade 2, LCIS(+), RS 22 -found on screening mammogram. Right diagnostic mammogram on 12/05/20 showed 0.7 cm irregular mass at 12 o'clock. Biopsy on 12/17/20 revealed invasive and in situ lobular carcinoma, grade 2. ER+/PR-/Her2-. -she underwent right lumpectomy on 01/30/21 with Dr. Marlou Starks. Pathology showed 1.5 cm ILC and lobular neoplasia, margins uninvolved. One lymph node showed isolated tumor cells, otherwise no carcinoma and is considered pN0. -Oncotype DX was obtained on the final surgical sample and the recurrence score of 22 predicts a risk of recurrence outside the breast over the next 9 years of 8%, if the patient's only systemic therapy is an antiestrogen for 5 years.  this is consider low risk and adjuvant chemo is not recommended. I reviewed the result with her in detail today  -she proceeded to adjuvant radiation under Dr. Sondra Come 10/17-11/14/22. --Given her strongly ER and PR positivity of the tumor cells, her pre-menopause status, I recommend adjuvant endocrine therapy with tamoxifen. The potential side effects, which includes but not limited to, hot flash, skin and vaginal dryness, slightly increased risk of  cardiovascular disease and cataract, small risk of thrombosis and endometrial cancer, were discussed with her in great details. Preventive strategies for thrombosis, such as being physically active, using compression stocks, avoid cigarette smoking, etc., were reviewed with her. I also recommend her to follow-up with her gynecologist once a year, and watch for vaginal spotting or bleeding, as a clinically sign of endometrial cancer, etc. She voiced good understanding, and agrees to proceed. She will plan to start after the holidays. -Alternative adjuvant endocrine therapy, such as aromatase inhibitor and overexpression were discussed with her also. Due to her very early stage breast cancer, and peri-menopausal status, I do not strongly feel this is necessary. -I discussed switching Tamoxifen to AI when she became postmenopausal in next few years  -We also reviewed the breast cancer surveillance, including annual mammogram and screening MRI, and physical exam. We reviewed symptoms which are concerning for cancer recurrence and she knows what to watch at home.  -Due to her LCIS, she is at high risk for future breast cancer.  I recommend adding screening breast MRI in addition to annual mammogram.  She agrees.    PLAN:  -start tamoxifen 06/2021, I called in today  -survivorship in 3 months -lab and f/u in 6 months -next mammogram 11/2021, then start screening MRI 04/2022   No problem-specific Assessment & Plan notes found for this encounter.   SUMMARY OF ONCOLOGIC HISTORY: Oncology History Overview Note   Cancer Staging  Malignant neoplasm of upper-outer quadrant of right breast in female, estrogen receptor positive ([place]) Staging form: Breast, AJCC 8th Edition - Clinical stage from 12/26/2020: Stage IA (cT1b, cN0, cM0, G2, ER+, PR-, HER2-) - Unsigned Stage prefix: Initial diagnosis  Histologic grading system: 3 grade system Staged by: Pathologist and managing physician Stage used in treatment  planning: Yes National guidelines used in treatment planning: Yes Type of national guideline used in treatment planning: NCCN - Pathologic stage from 01/12/2021: Stage IA (pT1c, pN0(i+), cM0, G2, ER+, PR-, HER2-, Oncotype DX score: 22) - Signed by Truitt Merle, MD on 04/27/2021 Stage prefix: Initial diagnosis Multigene prognostic tests performed: Oncotype DX Recurrence score range: Greater than or equal to 11 Histologic grading system: 3 grade system Residual tumor (R): R0 - None    Malignant neoplasm of upper-outer quadrant of right breast in female, estrogen receptor positive ([place])  12/05/2020 Mammogram   Right Breast Diagnostic Mammogram; Right Breast Ultrasound  IMPRESSION: The 0.4 x 0.5 x 0.7 cm taller-than-wide irregular mass in the right breast is suspicious of malignancy. No significant abnormalities were seen sonographically in the right axilla.   12/17/2020 Pathology Results   Diagnosis:  Breast, right, needle core biopsy, 12 O'CLOCK middle depth, 9 cmfn - INVASIVE MAMMARY CARCINOMA - MAMMARY CARCINOMA IN SITU  The carcinoma appears Nottingham grade 2 of 3. E-cadherin is negative in the invasive carcinoma, supporting lobular origin.  PROGNOSTIC INDICATORS Results: IMMUNOHISTOCHEMICAL AND MORPHOMETRIC ANALYSIS PERFORMED MANUALLY The tumor cells are EQUIVOCAL for Her2 (2+). Her2 by FISH will be performed and results reported separately. Estrogen Receptor: 90%, POSITIVE, MODERATE STAINING INTENSITY Progesterone Receptor: 0%, NEGATIVE Proliferation Marker Ki67: 1%  FLUORESCENCE IN-SITU HYBRIDIZATION Results: GROUP 5: HER 2 **NEGATIVE** Equivocal form of amplification of the HER2 gene was detected in the IHC 2+ tissue sample received from this individual. HER2 FISH was performed by a technologist and cell imaging and analysis on the BioView. RATIO OF ER2/CEN17 SIGNALS 1.15 AVERAGE HER2 COPY NUMBER PER CELL 1.95   12/24/2020 Initial Diagnosis   Malignant neoplasm of upper-outer  quadrant of right breast in female, estrogen receptor positive ([place])   01/12/2021 Cancer Staging   Staging form: Breast, AJCC 8th Edition - Pathologic stage from 01/12/2021: Stage IA (pT1c, pN0(i+), cM0, G2, ER+, PR-, HER2-, Oncotype DX score: 22) - Signed by Truitt Merle, MD on 04/27/2021 Stage prefix: Initial diagnosis Multigene prognostic tests performed: Oncotype DX Recurrence score range: Greater than or equal to 11 Histologic grading system: 3 grade system Residual tumor (R): R0 - None    01/30/2021 Definitive Surgery   FINAL MICROSCOPIC DIAGNOSIS:   A. LYMPH NODE, RIGHT AXILLARY #1, SENTINEL, EXCISION:  -  Isolated tumor cells identified in one lymph node (0i/1)  -  See comment   B. LYMPH NODE, RIGHT AXILLARY, SENTINEL, EXCISION:  -  No carcinoma identified in one lymph node (0/1)  -  See comment   C. LYMPH NODE, RIGHT AXILLARY #2, SENTINEL, EXCISION:  -  No carcinoma identified in one lymph node (0/1)  -  See comment   D. LYMPH NODE, RIGHT AXILLARY, SENTINEL, EXCISION:  -  No carcinoma identified in one lymph node (0/1)  -  See comment   E. LYMPH NODE, RIGHT AXILLARY #3, SENTINEL, EXCISION:  -  No carcinoma identified in one lymph node (0/1)  -  See comment   F. LYMPH NODE, RIGHT AXILLARY #4, SENTINEL, EXCISION:  -  No carcinoma identified in one lymph node (0/1)  -  See comment   G. LYMPH NODE, RIGHT AXILLARY, SENTINEL, EXCISION:  -  No carcinoma identified in one lymph node (0/1)  -  See comment   H. LYMPH NODE, RIGHT AXILLARY #5, SENTINEL, EXCISION:  -  Benign fibroadipose tissue  -  No lymphoid tissue identified   I. BREAST, RIGHT, LUMPECTOMY:  -  Invasive lobular carcinoma, Nottingham grade 2 of 3, 1.5 cm  -  Lobular neoplasia (atypical lobular hyperplasia)  -  Margins uninvolved by carcinoma (0.7 cm; inferior margin)  -  Previous biopsy site changes present  -  See oncology table and comment below    01/30/2021 Miscellaneous   Oncotype Recurrence Score of  22, risk of distant metastasis is 8% with antiestrogen therapy alone.   03/18/2021 - 04/15/2021 Radiation Therapy   Site Technique Total Dose (Gy) Dose per Fx (Gy) Completed Fx Beam Energies  Breast, Right: Breast_Rt 3D 40.05/40.05 2.67 15/15 10X  Breast, Right: Breast_Rt_Bst 3D 10/10 2 5/5 6X, 10X        INTERVAL HISTORY:  Carla Patrick is here for a follow up of breast cancer. She was last seen by me on 12/26/20 in consultation. She presents to the clinic alone. She reports she is recovering well from radiation therapy. She notes she had some skin breakdown that is still healing. She also notes some continued fatigue. She notes she is having periods about every 2 months at this point. She denies experiencing any menopause symptoms.   All other systems were reviewed with the patient and are negative.  MEDICAL HISTORY:  Past Medical History:  Diagnosis Date   Abnormal EKG    Anemia    Anxiety    Asthma    Cancer ([place]) 11/2020   right breast [facility]   Depression    Heart murmur    Hyperlipidemia    Hypertension     SURGICAL HISTORY: Past Surgical History:  Procedure Laterality Date   BREAST EXCISIONAL BIOPSY  2007   left   BREAST LUMPECTOMY WITH RADIOACTIVE SEED AND SENTINEL LYMPH NODE BIOPSY Right 01/30/2021   Procedure: RIGHT BREAST LUMPECTOMY WITH RADIOACTIVE SEED AND SENTINEL LYMPH NODE BIOPSY;  Surgeon: Jovita Kussmaul, MD;  Location: [place];  Service: General;  Laterality: Right;   CHOLECYSTECTOMY  10/04/2011   Procedure: LAPAROSCOPIC CHOLECYSTECTOMY WITH INTRAOPERATIVE CHOLANGIOGRAM;  Surgeon: Rolm Bookbinder, MD;  Location: [facility];  Service: General;  Laterality: N/A;    I have reviewed the social history and family history with the patient and they are unchanged from previous note.  ALLERGIES:  has No Known Allergies.  MEDICATIONS:  Current Outpatient Medications  Medication Sig Dispense Refill   tamoxifen (NOLVADEX) 20 MG tablet Take 1  tablet (20 mg total) by mouth daily. 30 tablet 3   betamethasone, augmented, (DIPROLENE) 0.05 % lotion      cholecalciferol (VITAMIN D3) 25 MCG (1000 UNIT) tablet Take 1,000 Units by mouth daily.     folic acid (FOLVITE) 909 MCG tablet Take 400 mcg by mouth daily.     hydrochlorothiazide (HYDRODIURIL) 25 MG tablet Take 12.5 mg by mouth daily.     Multiple Vitamins-Iron (MULTIVITAMINS WITH IRON) TABS tablet 1 tablet     PROAIR HFA 108 (90 BASE) MCG/ACT inhaler      rosuvastatin (CRESTOR) 20 MG tablet Take 20 mg by mouth daily.     Semaglutide-Weight Management 2.4 MG/0.75ML SOAJ INJECT 2.4MG SUBCUTANEOUSLY EVERY WEEK 3 mL 2   SYMBICORT 160-4.5 MCG/ACT inhaler SMARTSIG:2 Puff(s) By Mouth Twice Daily PRN     telmisartan (MICARDIS) 80 MG tablet Take 80 mg by mouth daily.     No current facility-administered medications for this visit.    PHYSICAL EXAMINATION: ECOG PERFORMANCE STATUS: 0  Vitals:   04/29/21 0842  BP: Marland Kitchen)  128/92  Pulse: 77  Resp: 18  Temp: 98.1 F (36.7 C)  SpO2: 100%   Wt Readings from Last 3 Encounters:  04/29/21 200 lb 3.2 oz (90.8 kg)  04/23/21 199 lb 8 oz (90.5 kg)  03/06/21 194 lb 12.8 oz (88.4 kg)     GENERAL:alert, no distress and comfortable SKIN: skin color, texture, turgor are normal, no rashes or significant lesions, (+) skin peeling from radiation, no skin breakdown EYES: normal, Conjunctiva are pink and non-injected, sclera clear  NECK: supple, thyroid normal size, non-tender, without nodularity LYMPH:  no palpable lymphadenopathy in the cervical, axillary, (+) lymphadenopathy present to right arm LUNGS: clear to auscultation and percussion with normal breathing effort HEART: regular rate & rhythm and no murmurs and no lower extremity edema ABDOMEN:abdomen soft, non-tender and normal bowel sounds Musculoskeletal:no cyanosis of digits and no clubbing  NEURO: alert & oriented x 3 with fluent speech, no focal motor/sensory deficits BREAST: No palpable  mass, nodules or adenopathy bilaterally. Breast exam benign.   LABORATORY DATA:  I have reviewed the data as listed CBC Latest Ref Rng & Units 12/26/2020 10/03/2011 10/02/2011  WBC 4.0 - 10.5 K/uL 7.5 8.9 9.2  Hemoglobin 12.0 - 15.0 g/dL 11.6(L) 10.5(L) 12.4  Hematocrit 36.0 - 46.0 % 34.6(L) 30.9(L) 36.4  Platelets 150 - 400 K/uL 353 333 415(H)     CMP Latest Ref Rng & Units 01/29/2021 12/26/2020 10/05/2011  Glucose 70 - 99 mg/dL 134(H) 139(H) 189(H)  BUN 6 - 20 mg/dL 15 12 4(L)  Creatinine 0.44 - 1.00 mg/dL 0.82 0.89 0.67  Sodium 135 - 145 mmol/L 139 140 134(L)  Potassium 3.5 - 5.1 mmol/L 4.2 3.7 3.6  Chloride 98 - 111 mmol/L 103 103 100  CO2 22 - 32 mmol/L 26 27 21   Calcium 8.9 - 10.3 mg/dL 9.5 9.6 8.8  Total Protein 6.5 - 8.1 g/dL - 7.2 6.6  Total Bilirubin 0.3 - 1.2 mg/dL - 0.8 0.7  Alkaline Phos 38 - 126 U/L - 81 127(H)  AST 15 - 41 U/L - 19 70(H)  ALT 0 - 44 U/L - 23 172(H)      RADIOGRAPHIC STUDIES: I have personally reviewed the radiological images as listed and agreed with the findings in the report. No results found.    No orders of the defined types were placed in this encounter.  All questions were answered. The patient knows to call the clinic with any problems, questions or concerns. No barriers to learning was detected. The total time spent in the appointment was 40 minutes.     Truitt Merle, MD 04/29/2021   I, Wilburn Mylar, am acting as scribe for Truitt Merle, MD.   I have reviewed the above documentation for accuracy and completeness, and I agree with the above.

## 2021-04-30 ENCOUNTER — Ambulatory Visit: Payer: BC Managed Care – PPO

## 2021-04-30 ENCOUNTER — Telehealth: Payer: Self-pay | Admitting: Hematology

## 2021-04-30 NOTE — Telephone Encounter (Signed)
Left message with follow-up appointments per 11/28 los. 

## 2021-05-01 ENCOUNTER — Ambulatory Visit: Payer: BC Managed Care – PPO

## 2021-05-02 ENCOUNTER — Encounter: Payer: Self-pay | Admitting: *Deleted

## 2021-05-02 ENCOUNTER — Ambulatory Visit: Payer: BC Managed Care – PPO

## 2021-05-02 DIAGNOSIS — C50411 Malignant neoplasm of upper-outer quadrant of right female breast: Secondary | ICD-10-CM

## 2021-05-02 DIAGNOSIS — Z170 Estrogen receptor positive status [ER+]: Secondary | ICD-10-CM

## 2021-05-03 ENCOUNTER — Ambulatory Visit: Payer: BC Managed Care – PPO

## 2021-05-07 ENCOUNTER — Other Ambulatory Visit: Payer: Self-pay

## 2021-05-13 ENCOUNTER — Encounter: Payer: Self-pay | Admitting: Rehabilitation

## 2021-05-13 ENCOUNTER — Ambulatory Visit: Payer: Self-pay

## 2021-05-14 ENCOUNTER — Telehealth: Payer: Self-pay

## 2021-05-14 NOTE — Telephone Encounter (Signed)
Notified Patient of completion of Critical Illness and Cancer Claim Forms. Copies placed for pick-up as requested.

## 2021-05-22 ENCOUNTER — Other Ambulatory Visit (HOSPITAL_COMMUNITY): Payer: Self-pay

## 2021-05-22 MED FILL — Semaglutide (Weight Mngmt) Soln Auto-Injector 2.4 MG/0.75ML: SUBCUTANEOUS | 28 days supply | Qty: 3 | Fill #1 | Status: CN

## 2021-05-30 ENCOUNTER — Other Ambulatory Visit (HOSPITAL_COMMUNITY): Payer: Self-pay

## 2021-06-26 ENCOUNTER — Other Ambulatory Visit (HOSPITAL_COMMUNITY): Payer: Self-pay

## 2021-06-26 MED FILL — Semaglutide (Weight Mngmt) Soln Auto-Injector 2.4 MG/0.75ML: SUBCUTANEOUS | 28 days supply | Qty: 3 | Fill #1 | Status: AC

## 2021-07-18 ENCOUNTER — Other Ambulatory Visit: Payer: Self-pay | Admitting: Internal Medicine

## 2021-07-18 DIAGNOSIS — R918 Other nonspecific abnormal finding of lung field: Secondary | ICD-10-CM

## 2021-07-23 ENCOUNTER — Encounter: Payer: Self-pay | Admitting: Radiation Oncology

## 2021-07-26 ENCOUNTER — Telehealth: Payer: Self-pay | Admitting: *Deleted

## 2021-07-26 NOTE — Progress Notes (Incomplete)
Carla Patrick is here today for follow up post radiation to the breast.   Breast Side:right   They completed their radiation on: 04/15/2021   Does the patient complain of any of the following: Post radiation skin issues: *** Breast Tenderness: *** Breast Swelling: *** Lymphadema: *** Range of Motion limitations: *** Fatigue post radiation: *** Appetite good/fair/poor: ***  Additional comments if applicable: ***  ***

## 2021-07-26 NOTE — Telephone Encounter (Signed)
Returned patient's phone call, spoke with patient 

## 2021-07-29 ENCOUNTER — Telehealth: Payer: Self-pay | Admitting: *Deleted

## 2021-07-29 ENCOUNTER — Ambulatory Visit: Payer: BC Managed Care – PPO | Admitting: Radiation Oncology

## 2021-07-30 ENCOUNTER — Inpatient Hospital Stay: Payer: BC Managed Care – PPO | Admitting: Nurse Practitioner

## 2021-08-05 ENCOUNTER — Encounter: Payer: Self-pay | Admitting: Hematology

## 2021-08-08 ENCOUNTER — Telehealth: Payer: Self-pay | Admitting: *Deleted

## 2021-08-08 NOTE — Telephone Encounter (Signed)
RETURNED PATIENT'S PHONE CALL, SPOKE WITH PATIENT. ?

## 2021-08-23 ENCOUNTER — Inpatient Hospital Stay: Admission: RE | Admit: 2021-08-23 | Payer: BC Managed Care – PPO | Source: Ambulatory Visit

## 2021-09-03 ENCOUNTER — Inpatient Hospital Stay: Payer: BC Managed Care – PPO | Attending: Nurse Practitioner | Admitting: Nurse Practitioner

## 2021-09-03 NOTE — Progress Notes (Deleted)
? ?CLINIC:  ?Survivorship  ? ?REASON FOR VISIT:  ?Routine follow-up post-treatment for a recent history of breast cancer. ? ?BRIEF ONCOLOGIC HISTORY:  ?Oncology History Overview Note  ? Cancer Staging  ?Malignant neoplasm of upper-outer quadrant of right breast in female, estrogen receptor positive ([place]) ?Staging form: Breast, AJCC 8th Edition ?- Clinical stage from 12/26/2020: Stage IA (cT1b, cN0, cM0, G2, ER+, PR-, HER2-) - Unsigned ?Stage prefix: Initial diagnosis ?Histologic grading system: 3 grade system ?Staged by: Pathologist and managing physician ?Stage used in treatment planning: Yes ?National guidelines used in treatment planning: Yes ?Type of national guideline used in treatment planning: NCCN ?- Pathologic stage from 01/12/2021: Stage IA (pT1c, pN0(i+), cM0, G2, ER+, PR-, HER2-, Oncotype DX score: 22) - Signed by Truitt Merle, MD on 04/27/2021 ?Stage prefix: Initial diagnosis ?Multigene prognostic tests performed: Oncotype DX ?Recurrence score range: Greater than or equal to 11 ?Histologic grading system: 3 grade system ?Residual tumor (R): R0 - None ? ?  ?Malignant neoplasm of upper-outer quadrant of right breast in female, estrogen receptor positive ([place])  ?12/05/2020 Mammogram  ? Right Breast Diagnostic Mammogram; Right Breast Ultrasound ? ?IMPRESSION: ?The 0.4 x 0.5 x 0.7 cm taller-than-wide irregular mass in the right breast is suspicious of malignancy. No significant abnormalities were seen sonographically in the right axilla. ?  ?12/17/2020 Pathology Results  ? Diagnosis: ? ?Breast, right, needle core biopsy, 12 O'CLOCK middle depth, 9 cmfn ?- INVASIVE MAMMARY CARCINOMA ?- MAMMARY CARCINOMA IN SITU ? ?The carcinoma appears Nottingham grade 2 of 3. E-cadherin is negative in the invasive carcinoma, supporting lobular origin. ? ?PROGNOSTIC INDICATORS ?Results: ?IMMUNOHISTOCHEMICAL AND MORPHOMETRIC ANALYSIS PERFORMED MANUALLY ?The tumor cells are EQUIVOCAL for Her2 (2+). Her2 by FISH will be performed and  results reported separately. ?Estrogen Receptor: 90%, POSITIVE, MODERATE STAINING INTENSITY ?Progesterone Receptor: 0%, NEGATIVE ?Proliferation Marker Ki67: 1% ? ?FLUORESCENCE IN-SITU HYBRIDIZATION ?Results: ?GROUP 5: HER 2 **NEGATIVE** ?Equivocal form of amplification of the HER2 gene was detected in the IHC 2+ tissue sample received from this individual. HER2 FISH was performed by a technologist and cell imaging and analysis on the BioView. ?RATIO OF ER2/CEN17 SIGNALS 1.15 ?AVERAGE HER2 COPY NUMBER PER CELL 1.95 ?  ?12/24/2020 Initial Diagnosis  ? Malignant neoplasm of upper-outer quadrant of right breast in female, estrogen receptor positive ([place]) ?  ?01/12/2021 Cancer Staging  ? Staging form: Breast, AJCC 8th Edition ?- Pathologic stage from 01/12/2021: Stage IA (pT1c, pN0(i+), cM0, G2, ER+, PR-, HER2-, Oncotype DX score: 22) - Signed by Truitt Merle, MD on 04/27/2021 ?Stage prefix: Initial diagnosis ?Multigene prognostic tests performed: Oncotype DX ?Recurrence score range: Greater than or equal to 11 ?Histologic grading system: 3 grade system ?Residual tumor (R): R0 - None ? ?  ?01/30/2021 Definitive Surgery  ? FINAL MICROSCOPIC DIAGNOSIS:  ? ?A. LYMPH NODE, RIGHT AXILLARY #1, SENTINEL, EXCISION:  ?-  Isolated tumor cells identified in one lymph node (0i/1)  ?-  See comment  ? ?B. LYMPH NODE, RIGHT AXILLARY, SENTINEL, EXCISION:  ?-  No carcinoma identified in one lymph node (0/1)  ?-  See comment  ? ?C. LYMPH NODE, RIGHT AXILLARY #2, SENTINEL, EXCISION:  ?-  No carcinoma identified in one lymph node (0/1)  ?-  See comment  ? ?D. LYMPH NODE, RIGHT AXILLARY, SENTINEL, EXCISION:  ?-  No carcinoma identified in one lymph node (0/1)  ?-  See comment  ? ?E. LYMPH NODE, RIGHT AXILLARY #3, SENTINEL, EXCISION:  ?-  No carcinoma identified in one lymph node (0/1)  ?-  See comment  ? ?F. LYMPH NODE, RIGHT AXILLARY #4, SENTINEL, EXCISION:  ?-  No carcinoma identified in one lymph node (0/1)  ?-  See comment  ? ?G. LYMPH NODE,  RIGHT AXILLARY, SENTINEL, EXCISION:  ?-  No carcinoma identified in one lymph node (0/1)  ?-  See comment  ? ?H. LYMPH NODE, RIGHT AXILLARY #5, SENTINEL, EXCISION:  ?-  Benign fibroadipose tissue  ?-  No lymphoid tissue identified  ? ?I. BREAST, RIGHT, LUMPECTOMY:  ?-  Invasive lobular carcinoma, Nottingham grade 2 of 3, 1.5 cm  ?-  Lobular neoplasia (atypical lobular hyperplasia)  ?-  Margins uninvolved by carcinoma (0.7 cm; inferior margin)  ?-  Previous biopsy site changes present  ?-  See oncology table and comment below  ?  ?01/30/2021 Miscellaneous  ? Oncotype Recurrence Score of 22, risk of distant metastasis is 8% with antiestrogen therapy alone. ?  ?03/18/2021 - 04/15/2021 Radiation Therapy  ? Site Technique Total Dose (Gy) Dose per Fx (Gy) Completed Fx Beam Energies  ?Breast, Right: Breast_Rt 3D 40.05/40.05 2.67 15/15 10X  ?Breast, Right: Breast_Rt_Bst 3D 10/10 2 5/5 6X, 10X  ? ?  ? ? ?INTERVAL HISTORY:  ?Carla Patrick presents to the [facility] today for our initial meeting to review her survivorship care plan detailing her treatment course for breast cancer, as well as monitoring long-term side effects of that treatment, education regarding health maintenance, screening, and overall wellness and health promotion.    ? ?Overall, [name] reports feeling quite well since completing her radiation therapy approximately 3 months ago.  She *** ? ? ? ?REVIEW OF SYSTEMS:  ?Review of Systems - Oncology ?Breast: Denies any new nodularity, masses, tenderness, nipple changes, or nipple discharge.  ? ? ? ? ?ONCOLOGY TREATMENT TEAM:  ?1. Surgeon:  Dr. Marlou Starks at [facility] ?2. Medical Oncologist: Dr. Burr Medico  ?3. Radiation Oncologist: Dr. Sondra Come  ?  ? ?PAST MEDICAL/SURGICAL HISTORY:  ?Past Medical History:  ?Diagnosis Date  ? Abnormal EKG   ? Anemia   ? Anxiety   ? Asthma   ? Cancer ([place]) 11/2020  ? right breast [facility]  ? Depression   ? Heart murmur   ? History of radiation therapy   ? Right Breast-  03/18/21-04/15/21-Dr. Gery Pray  ? Hyperlipidemia   ? Hypertension   ? ?Past Surgical History:  ?Procedure Laterality Date  ? BREAST EXCISIONAL BIOPSY  2007  ? left  ? BREAST LUMPECTOMY WITH RADIOACTIVE SEED AND SENTINEL LYMPH NODE BIOPSY Right 01/30/2021  ? Procedure: RIGHT BREAST LUMPECTOMY WITH RADIOACTIVE SEED AND SENTINEL LYMPH NODE BIOPSY;  Surgeon: Jovita Kussmaul, MD;  Location: [place];  Service: General;  Laterality: Right;  ? CHOLECYSTECTOMY  10/04/2011  ? Procedure: LAPAROSCOPIC CHOLECYSTECTOMY WITH INTRAOPERATIVE CHOLANGIOGRAM;  Surgeon: Rolm Bookbinder, MD;  Location: [facility];  Service: General;  Laterality: N/A;  ? ? ? ?ALLERGIES:  ?No Known Allergies ? ? ?CURRENT MEDICATIONS:  ?Outpatient Encounter Medications as of 09/03/2021  ?Medication Sig Note  ? betamethasone, augmented, (DIPROLENE) 0.05 % lotion    ? cholecalciferol (VITAMIN D3) 25 MCG (1000 UNIT) tablet Take 1,000 Units by mouth daily.   ? folic acid (FOLVITE) 637 MCG tablet Take 400 mcg by mouth daily.   ? hydrochlorothiazide (HYDRODIURIL) 25 MG tablet Take 12.5 mg by mouth daily.   ? Multiple Vitamins-Iron (MULTIVITAMINS WITH IRON) TABS tablet 1 tablet   ? PROAIR HFA 108 (90 BASE) MCG/ACT inhaler  11/08/2013: Received from: External Pharmacy  ?  rosuvastatin (CRESTOR) 20 MG tablet Take 20 mg by mouth daily.   ? SYMBICORT 160-4.5 MCG/ACT inhaler SMARTSIG:2 Puff(s) By Mouth Twice Daily PRN   ? tamoxifen (NOLVADEX) 20 MG tablet Take 1 tablet (20 mg total) by mouth daily.   ? telmisartan (MICARDIS) 80 MG tablet Take 80 mg by mouth daily.   ? ?No facility-administered encounter medications on file as of 09/03/2021.  ? ? ? ?ONCOLOGIC FAMILY HISTORY:  ?Family History  ?Problem Relation Age of Onset  ? HIV Father   ? Heart disease Maternal Grandmother   ? Diabetes Maternal Grandfather   ? Diabetes Paternal Grandmother   ? ? ? ?GENETIC COUNSELING/TESTING: ?*** ? ?SOCIAL HISTORY:  ?TEIGEN PARSLOW is  /single/married/divorced/widowed/separated and lives alone/with her spouse/family/friend in (city), [place].  She has (#) children and they live in (city).  Ms. Waguespack is currently retired/disabled/working part-time/full-time as ***.  She

## 2021-09-04 NOTE — Progress Notes (Incomplete)
?Radiation Oncology         [phone] ?________________________________ ? ?Name: Carla Patrick MRN: [id number]  ?Date: 09/05/2021  DOB: 02-24-1966 ? ?Follow-Up Visit Note ? ?CC: Deland Pretty, MD  Deland Pretty, MD ? ?No diagnosis found. ? ?Diagnosis:   Stage IA (cT1b, cN0, cM0) Right Breast UOQ, Invasive Lobular Carcinoma with lobular neoplasia, ER+ / PR- / Her2-, Grade 2 ? ?Interval Since Last Radiation:  4 months and 23 days  ? ?Intent: Curative ? ?Radiation Treatment Dates: 03/18/2021 through 04/15/2021 ?Site Technique Total Dose (Gy) Dose per Fx (Gy) Completed Fx Beam Energies  ?Breast, Right: Breast_Rt 3D 40.05/40.05 2.67 15/15 10X  ?Breast, Right: Breast_Rt_Bst 3D 10/10 2 5/5 6X, 10X  ? ? ?Narrative:  The patient returns today for routine follow-up, she was last seen here for follow up on 04/23/21. Since her last visit, the patient followed up with Dr. Burr Medico on 04/29/21. During which time, the patient endorsed recovering well from radiation therapy, and reported some persistent skin breakdown that is still healing. She also noted some continued fatigue and having her menses about every 2 months. She denied experiencing any menopausal symptoms.  Given her strong tumor cell ER and PR positivity, and her pre-menopausal status, Dr. Burr Medico recommended adjuvant endocrine therapy with tamoxifen which the patient agreed to proceed with. Per a telephone conversation between the patient and Dr. Burr Medico on 08/05/21, the patient reported some side effects from tamoxifen which interfered with her work. She is now alternating between 20 mg and 10 mg dosing which seems to make her side effects more tolerable.           ? ?Otherwise, no significant interval history since the patient was last seen.  ? ?***                 ? ?Allergies:  has No Known Allergies. ? ?Meds: ?Current Outpatient Medications  ?Medication Sig Dispense Refill  ? betamethasone, augmented, (DIPROLENE) 0.05 % lotion     ? cholecalciferol (VITAMIN D3) 25  MCG (1000 UNIT) tablet Take 1,000 Units by mouth daily.    ? folic acid (FOLVITE) 710 MCG tablet Take 400 mcg by mouth daily.    ? hydrochlorothiazide (HYDRODIURIL) 25 MG tablet Take 12.5 mg by mouth daily.    ? Multiple Vitamins-Iron (MULTIVITAMINS WITH IRON) TABS tablet 1 tablet    ? PROAIR HFA 108 (90 BASE) MCG/ACT inhaler     ? rosuvastatin (CRESTOR) 20 MG tablet Take 20 mg by mouth daily.    ? SYMBICORT 160-4.5 MCG/ACT inhaler SMARTSIG:2 Puff(s) By Mouth Twice Daily PRN    ? tamoxifen (NOLVADEX) 20 MG tablet Take 1 tablet (20 mg total) by mouth daily. 30 tablet 3  ? telmisartan (MICARDIS) 80 MG tablet Take 80 mg by mouth daily.    ? ?No current facility-administered medications for this encounter.  ? ? ?Physical Findings: ?The patient is in no acute distress. Patient is alert and oriented. ? vitals were not taken for this visit. .  No significant changes. Lungs are clear to auscultation bilaterally. Heart has regular rate and rhythm. No palpable cervical, supraclavicular, or axillary adenopathy. Abdomen soft, non-tender, normal bowel sounds. ? ?Right Breast: no palpable mass, nipple discharge or bleeding. *** ? ? ? ?Lab Findings: ?Lab Results  ?Component Value Date  ? WBC 7.5 12/26/2020  ? HGB 11.6 (L) 12/26/2020  ? HCT 34.6 (L) 12/26/2020  ? MCV 96.1 12/26/2020  ? PLT 353 12/26/2020  ? ? ?Radiographic Findings: ?No results  found. ? ?Impression:  Stage IA (cT1b, cN0, cM0) Right Breast UOQ, Invasive Lobular Carcinoma with lobular neoplasia, ER+ / PR- / Her2-, Grade 2 ? ?The patient is recovering from the effects of radiation.  *** ? ?Plan:  *** ? ? ?*** minutes of total time was spent for this patient encounter, including preparation, face-to-face counseling with the patient and coordination of care, physical exam, and documentation of the encounter. ?____________________________________ ? ?Blair Promise, PhD, MD ? ?This document serves as a record of services personally performed by Gery Pray, MD. It was  created on his behalf by Roney Mans, a trained medical scribe. The creation of this record is based on the scribe's personal observations and the provider's statements to them. This document has been checked and approved by the attending provider. ? ?

## 2021-09-05 ENCOUNTER — Telehealth: Payer: Self-pay | Admitting: *Deleted

## 2021-09-05 ENCOUNTER — Ambulatory Visit
Admission: RE | Admit: 2021-09-05 | Discharge: 2021-09-05 | Disposition: A | Discharge status: Home / Self Care | Payer: BC Managed Care – PPO | Source: Ambulatory Visit | Attending: Radiation Oncology | Admitting: Radiation Oncology

## 2021-09-05 NOTE — Telephone Encounter (Signed)
CALLED PATIENT TO ASK ABOUT RESCHEDULING MISSED FU FOR 09-05-21, LVM FOR A RETURN CALL ?

## 2021-09-18 ENCOUNTER — Encounter (HOSPITAL_COMMUNITY): Payer: Self-pay

## 2021-09-20 ENCOUNTER — Encounter: Payer: Self-pay | Admitting: Emergency Medicine

## 2021-09-20 ENCOUNTER — Ambulatory Visit
Admission: EM | Admit: 2021-09-20 | Discharge: 2021-09-20 | Disposition: A | Discharge status: Home / Self Care | Payer: BC Managed Care – PPO | Attending: Physician Assistant | Admitting: Physician Assistant

## 2021-09-20 ENCOUNTER — Ambulatory Visit
Admission: RE | Admit: 2021-09-20 | Discharge: 2021-09-20 | Disposition: A | Discharge status: Home / Self Care | Payer: BC Managed Care – PPO | Source: Ambulatory Visit | Attending: Internal Medicine | Admitting: Internal Medicine

## 2021-09-20 DIAGNOSIS — R002 Palpitations: Secondary | ICD-10-CM | POA: Insufficient documentation

## 2021-09-20 DIAGNOSIS — R42 Dizziness and giddiness: Secondary | ICD-10-CM | POA: Diagnosis present

## 2021-09-20 DIAGNOSIS — R918 Other nonspecific abnormal finding of lung field: Secondary | ICD-10-CM

## 2021-09-20 DIAGNOSIS — R9431 Abnormal electrocardiogram [ECG] [EKG]: Secondary | ICD-10-CM | POA: Diagnosis present

## 2021-09-20 LAB — POCT FASTING CBG KUC MANUAL ENTRY: POCT Glucose (KUC): 105 mg/dL — AB (ref 70–99)

## 2021-09-20 LAB — POCT URINALYSIS DIP (MANUAL ENTRY)
Bilirubin, UA: NEGATIVE
Glucose, UA: NEGATIVE mg/dL
Ketones, POC UA: NEGATIVE mg/dL
Nitrite, UA: NEGATIVE
Protein Ur, POC: NEGATIVE mg/dL
Spec Grav, UA: 1.02 (ref 1.010–1.025)
Urobilinogen, UA: 1 E.U./dL
pH, UA: 6.5 (ref 5.0–8.0)

## 2021-09-20 NOTE — ED Provider Notes (Signed)
?[facility] ? ? ? ?CSN: [id number] ?Arrival date & time: 09/20/21  1607 ? ? ?  ? ?History   ?Chief Complaint ?Chief Complaint  ?Patient presents with  ? Irregular Heartbeat  ? ? ?HPI ?Carla Patrick is a 56 y.o. female.  ? ?Patient presents today for an evaluation of episode that occurred approximately 5 days ago.  Reports that she was traveling on the bus when she felt lightheaded but denies syncopal episode.  She reports developing abdominal upset including nausea with associated hot sensation and diaphoresis.  She pulled over and had an episode of diarrhea but did not have any vomiting or significant abdominal pain.  She then felt much better and has not had recurrent episode but has had intermittent lightheadedness.  She denies any recent medication changes; she is not taking her tamoxifen but has not been doing this for approximately 1 month.  She denies any head injury.  Denies any recent illness including fever, nausea, vomiting, chest pain, shortness of breath.  She does report having episodes of intermittent palpitations for the past year.  She denies formal diagnosis of thyroid condition or arrhythmia.  She is eating and drinking normally and had no recent dietary changes prior to symptom onset. ? ? ?Past Medical History:  ?Diagnosis Date  ? Abnormal EKG   ? Anemia   ? Anxiety   ? Asthma   ? Cancer ([place]) 11/2020  ? right breast IMC  ? Depression   ? Heart murmur   ? History of radiation therapy   ? Right Breast- 03/18/21-04/15/21-Dr. Gery Pray  ? Hyperlipidemia   ? Hypertension   ? ? ?Patient Active Problem List  ? Diagnosis Date Noted  ? Malignant neoplasm of upper-outer quadrant of right breast in female, estrogen receptor positive ([place]) 12/24/2020  ? Right knee pain 11/08/2013  ? Abnormal EKG 07/15/2013  ? ? ?Past Surgical History:  ?Procedure Laterality Date  ? BREAST EXCISIONAL BIOPSY  2007  ? left  ? BREAST LUMPECTOMY WITH RADIOACTIVE SEED AND SENTINEL LYMPH NODE BIOPSY Right  01/30/2021  ? Procedure: RIGHT BREAST LUMPECTOMY WITH RADIOACTIVE SEED AND SENTINEL LYMPH NODE BIOPSY;  Surgeon: Jovita Kussmaul, MD;  Location: [facility];  Service: General;  Laterality: Right;  ? CHOLECYSTECTOMY  10/04/2011  ? Procedure: LAPAROSCOPIC CHOLECYSTECTOMY WITH INTRAOPERATIVE CHOLANGIOGRAM;  Surgeon: Rolm Bookbinder, MD;  Location: [facility];  Service: General;  Laterality: N/A;  ? ? ?OB History   ?No obstetric history on file. ?  ? ? ? ?Home Medications   ? ?Prior to Admission medications   ?Medication Sig Start Date End Date Taking? Authorizing Provider  ?betamethasone, augmented, (DIPROLENE) 0.05 % lotion  12/20/19  Yes [provider]  ?cholecalciferol (VITAMIN D3) 25 MCG (1000 UNIT) tablet Take 1,000 Units by mouth daily.   Yes [provider]  ?folic acid (FOLVITE) 371 MCG tablet Take 400 mcg by mouth daily.   Yes [provider]  ?hydrochlorothiazide (HYDRODIURIL) 25 MG tablet Take 12.5 mg by mouth daily. 07/05/20  Yes [provider]  ?Multiple Vitamins-Iron (MULTIVITAMINS WITH IRON) TABS tablet 1 tablet   Yes [provider]  ?PROAIR HFA 108 (90 BASE) MCG/ACT inhaler  11/02/13  Yes [provider]  ?rosuvastatin (CRESTOR) 20 MG tablet Take 20 mg by mouth daily. 12/06/19  Yes [provider]  ?Semaglutide-Weight Management (WEGOVY [place]) Inject into the skin.   Yes [provider]  ?SYMBICORT 160-4.5 MCG/ACT inhaler SMARTSIG:2 Puff(s) By Mouth Twice Daily PRN  02/16/20  Yes [provider]  ?tamoxifen (NOLVADEX) 20 MG tablet Take 1 tablet (20 mg total) by mouth daily. 04/29/21  Yes Truitt Merle, MD  ?telmisartan (MICARDIS) 80 MG tablet Take 80 mg by mouth daily. 12/06/19  Yes [provider]  ? ? ?Family History ?Family History  ?Problem Relation Age of Onset  ? HIV Father   ? Heart disease Maternal Grandmother   ? Diabetes Maternal Grandfather   ? Diabetes Paternal Grandmother   ? ? ?Social History ?Social  History  ? ?Tobacco Use  ? Smoking status: Never  ? Smokeless tobacco: Never  ?Vaping Use  ? Vaping Use: Never used  ?Substance Use Topics  ? Alcohol use: Yes  ?  Alcohol/week: 0.0 standard drinks  ?  Comment: occasional glass of wine  ? Drug use: No  ? ? ? ?Allergies   ?Patient has no known allergies. ? ? ?Review of Systems ?Review of Systems  ?Constitutional:  Positive for activity change. Negative for appetite change, fatigue and fever.  ?Eyes:  Negative for photophobia and visual disturbance.  ?Respiratory:  Negative for cough and shortness of breath.   ?Cardiovascular:  Positive for palpitations. Negative for chest pain and leg swelling.  ?Gastrointestinal:  Negative for abdominal pain, diarrhea (Resolved), nausea and vomiting.  ?Neurological:  Positive for light-headedness. Negative for dizziness, speech difficulty, weakness, numbness and headaches.  ? ? ?Physical Exam ?Triage Vital Signs ?ED Triage Vitals  ?Enc Vitals Group  ?   BP 09/20/21 1052 128/79  ?   Pulse Rate 09/20/21 1052 72  ?   Resp 09/20/21 1052 20  ?   Temp 09/20/21 1052 98 ?F (36.7 ?C)  ?   Temp Source 09/20/21 1052 Oral  ?   SpO2 09/20/21 1052 97 %  ?   Weight 09/20/21 1059 199 lb (90.3 kg)  ?   Height 09/20/21 1059 4' 11.5" (1.511 m)  ?   Head Circumference --   ?   Peak Flow --   ?   Pain Score 09/20/21 1059 0  ?   Pain Loc --   ?   Pain Edu? --   ?   Excl. in [place]? --   ? ?No data found. ? ?Updated Vital Signs ?BP 128/79 (BP Location: Left Arm)   Pulse 72   Temp 98 ?F (36.7 ?C) (Oral)   Resp 20   Ht 4' 11.5" (1.511 m)   Wt 199 lb (90.3 kg)   SpO2 97%   BMI 39.52 kg/m?  ? ?Visual Acuity ?Right Eye Distance:   ?Left Eye Distance:   ?Bilateral Distance:   ? ?Right Eye Near:   ?Left Eye Near:    ?Bilateral Near:    ? ?Physical Exam ?Vitals reviewed.  ?Constitutional:   ?   General: She is awake. She is not in acute distress. ?   Appearance: Normal appearance. She is well-developed. She is not ill-appearing.  ?   Comments: Very pleasant  female appears stated age in no acute distress sitting comfortably in exam room  ?HENT:  ?   Head: Normocephalic and atraumatic. No raccoon eyes, Battle's sign or contusion.  ?   Right Ear: External ear normal. There is impacted cerumen.  ?   Left Ear: External ear normal. There is impacted cerumen.  ?   Nose: Nose normal.  ?   Mouth/Throat:  ?   Tongue: Tongue does not deviate from midline.  ?   Pharynx: Uvula midline. No oropharyngeal exudate or posterior  oropharyngeal erythema.  ?Eyes:  ?   Extraocular Movements: Extraocular movements intact.  ?   Conjunctiva/sclera: Conjunctivae normal.  ?   Pupils: Pupils are equal, round, and reactive to light.  ?Cardiovascular:  ?   Rate and Rhythm: Normal rate and regular rhythm.  ?   Heart sounds: Normal heart sounds, S1 normal and S2 normal. No murmur heard. ?Pulmonary:  ?   Effort: Pulmonary effort is normal.  ?   Breath sounds: Normal breath sounds. No wheezing, rhonchi or rales.  ?   Comments: Clear to auscultation bilaterally ?Abdominal:  ?   Palpations: Abdomen is soft.  ?   Tenderness: There is no abdominal tenderness.  ?Musculoskeletal:  ?   Cervical back: Normal range of motion and neck supple. No spinous process tenderness or muscular tenderness.  ?   Comments: Strength 5/5 bilateral upper and lower extremities  ?Neurological:  ?   General: No focal deficit present.  ?   Cranial Nerves: Cranial nerves 2-12 are intact.  ?   Motor: Motor function is intact.  ?   Coordination: Coordination is intact.  ?   Gait: Gait is intact.  ?   Comments: No focal neurological defect on exam.  ?Psychiatric:     ?   Behavior: Behavior is cooperative.  ? ? ? ?UC Treatments / Results  ?Labs ?(all labs ordered are listed, but only abnormal results are displayed) ?Labs Reviewed  ?POCT FASTING CBG [facility] MANUAL ENTRY - Abnormal; Notable for the following components:  ?    Result Value  ? POCT Glucose ([facility]) 105 (*)   ? All other components within normal limits  ?POCT URINALYSIS DIP (MANUAL  ENTRY) - Abnormal; Notable for the following components:  ? Clarity, UA hazy (*)   ? Blood, UA small (*)   ? Leukocytes, UA Small (1+) (*)   ? All other components within normal limits  ?URINE CULTURE  ?CBC WITH DIFFERENT

## 2021-09-20 NOTE — Discharge Instructions (Signed)
Your EKG did change a little bit from the last tracing in February.  Given you are having this palpitation symptoms I recommend that you follow-up with a cardiologist.  Please call to schedule an appoint with them soon as possible.  I will contact you if any of your lab work is abnormal and we need to change her treatment plan.  Make sure you rest and drink plenty of fluid.  As we discussed, if you have any lightheadedness, passing out, chest pain, shortness of breath, heart racing you need to go to the emergency room as we discussed. ?

## 2021-09-20 NOTE — ED Triage Notes (Signed)
Patient states that she started feeling bad on Sunday, fatigue, dizziness, some blurred vision, diarrhea, feeling off, rapid heartbeat (abnormal).  Patient was traveling from [place].  Patient did not pass out, reached out to PCP advised to come to UC. ?

## 2021-09-21 LAB — COMPREHENSIVE METABOLIC PANEL
ALT: 21 IU/L (ref 0–32)
AST: 16 IU/L (ref 0–40)
Albumin/Globulin Ratio: 1.9 (ref 1.2–2.2)
Albumin: 4.4 g/dL (ref 3.8–4.9)
Alkaline Phosphatase: 83 IU/L (ref 44–121)
BUN/Creatinine Ratio: 14 (ref 9–23)
BUN: 10 mg/dL (ref 6–24)
Bilirubin Total: 0.6 mg/dL (ref 0.0–1.2)
CO2: 23 mmol/L (ref 20–29)
Calcium: 9.4 mg/dL (ref 8.7–10.2)
Chloride: 103 mmol/L (ref 96–106)
Creatinine, Ser: 0.72 mg/dL (ref 0.57–1.00)
Globulin, Total: 2.3 g/dL (ref 1.5–4.5)
Glucose: 103 mg/dL — ABNORMAL HIGH (ref 70–99)
Potassium: 4.6 mmol/L (ref 3.5–5.2)
Sodium: 144 mmol/L (ref 134–144)
Total Protein: 6.7 g/dL (ref 6.0–8.5)
eGFR: 99 mL/min/{1.73_m2} (ref >59)

## 2021-09-21 LAB — CBC WITH DIFFERENTIAL/PLATELET
Basophils Absolute: 0 10*3/uL (ref 0.0–0.2)
Basos: 1 %
EOS (ABSOLUTE): 0.1 10*3/uL (ref 0.0–0.4)
Eos: 2 %
Hematocrit: 36.5 % (ref 34.0–46.6)
Hemoglobin: 12.1 g/dL (ref 11.1–15.9)
Immature Grans (Abs): 0 10*3/uL (ref 0.0–0.1)
Immature Granulocytes: 0 %
Lymphocytes Absolute: 1.7 10*3/uL (ref 0.7–3.1)
Lymphs: 33 %
MCH: 31.9 pg (ref 26.6–33.0)
MCHC: 33.2 g/dL (ref 31.5–35.7)
MCV: 96 fL (ref 79–97)
Monocytes Absolute: 0.4 10*3/uL (ref 0.1–0.9)
Monocytes: 7 %
Neutrophils Absolute: 3.1 10*3/uL (ref 1.4–7.0)
Neutrophils: 57 %
Platelets: 333 10*3/uL (ref 150–450)
RBC: 3.79 x10E6/uL (ref 3.77–5.28)
RDW: 12.5 % (ref 11.7–15.4)
WBC: 5.3 10*3/uL (ref 3.4–10.8)

## 2021-09-21 LAB — URINE CULTURE

## 2021-09-21 LAB — TSH: TSH: 1.01 u[IU]/mL (ref 0.450–4.500)

## 2021-10-16 NOTE — Progress Notes (Signed)
Cardiology Office Note:    Date:  10/17/2021   ID:  SHAKTI FLEER, DOB 10/25/1965, MRN [id number]  PCP:  Deland Pretty, MD   [place] Providers Cardiologist:  None     Referring MD: Deland Pretty, MD   No chief complaint on file. Light headedness   History of Present Illness:    Carla Patrick is a 56 y.o. female with a hx of right breast cancer Stage IA ER + PR-, HER2- was planned for  tamoxifen therapy/not taking, referral for light headedness from the ED.  She went to the ED 4/21. She was traveling on a bus and felt LH.She felt hot. She pulled over and ahd diarrhea. She had a fear of dying during the episode. She felt better afterwards. She thinks she may have been constipated. Her symptoms resolved. Not taking her tamoxifen. EKG showed  LVH with repol unchanged from prior. She's had a CAC score in 07/2020 and it was zero.  She notes in the past she had palpitations that woke her up and she felt fatigued. No persistent palpations..  No syncope. No family hx of SCD.    Past Medical History:  Diagnosis Date   Abnormal EKG    Anemia    Anxiety    Asthma    Cancer ([place]) 11/2020   right breast [facility]   Depression    Heart murmur    History of radiation therapy    Right Breast- 03/18/21-04/15/21-Dr. Gery Pray   Hyperlipidemia    Hypertension     Past Surgical History:  Procedure Laterality Date   BREAST EXCISIONAL BIOPSY  2007   left   BREAST LUMPECTOMY WITH RADIOACTIVE SEED AND SENTINEL LYMPH NODE BIOPSY Right 01/30/2021   Procedure: RIGHT BREAST LUMPECTOMY WITH RADIOACTIVE SEED AND SENTINEL LYMPH NODE BIOPSY;  Surgeon: Jovita Kussmaul, MD;  Location: [facility];  Service: General;  Laterality: Right;   CHOLECYSTECTOMY  10/04/2011   Procedure: LAPAROSCOPIC CHOLECYSTECTOMY WITH INTRAOPERATIVE CHOLANGIOGRAM;  Surgeon: Rolm Bookbinder, MD;  Location[facility];  Service: General;  Laterality: N/A;    Current Medications: Current Meds  Medication Sig    cholecalciferol (VITAMIN D3) 25 MCG (1000 UNIT) tablet Take 1,000 Units by mouth daily.   folic acid (FOLVITE) 024 MCG tablet Take 400 mcg by mouth daily.   hydrochlorothiazide (HYDRODIURIL) 25 MG tablet Take 12.5 mg by mouth daily.   Multiple Vitamins-Iron (MULTIVITAMINS WITH IRON) TABS tablet 1 tablet   PROAIR HFA 108 (90 BASE) MCG/ACT inhaler    rosuvastatin (CRESTOR) 20 MG tablet Take 20 mg by mouth daily.   Semaglutide-Weight Management (WEGOVY [place]) Inject into the skin.   SYMBICORT 160-4.5 MCG/ACT inhaler SMARTSIG:2 Puff(s) By Mouth Twice Daily PRN   tamoxifen (NOLVADEX) 20 MG tablet Take 1 tablet (20 mg total) by mouth daily.   telmisartan (MICARDIS) 80 MG tablet Take 80 mg by mouth daily.     Allergies:   Patient has no known allergies.   Social History   Socioeconomic History   Marital status: Married    Spouse name: Not on file   Number of children: 0   Years of education: Not on file   Highest education level: Not on file  Occupational History   Not on file  Tobacco Use   Smoking status: Never   Smokeless tobacco: Never  Vaping Use   Vaping Use: Never used  Substance and Sexual Activity   Alcohol use: Yes    Alcohol/week: 0.0 standard drinks  Comment: occasional glass of wine   Drug use: No   Sexual activity: Yes    Birth control/protection: None  Other Topics Concern   Not on file  Social History Narrative   Not on file   Social Determinants of Health   Financial Resource Strain: Not on file  Food Insecurity: Not on file  Transportation Needs: Not on file  Physical Activity: Not on file  Stress: Not on file  Social Connections: Not on file     Family History: The patient's family history includes Diabetes in her maternal grandfather and paternal grandmother; HIV in her father; Heart disease in her maternal grandmother. Maternal GM with heart disease  ROS:   Please see the history of present illness.     All other systems reviewed and are  negative.  EKGs/Labs/Other Studies Reviewed:    The following studies were reviewed today:   EKG:  EKG is  ordered today.  The ekg ordered today demonstrates   10/17/2021-NSR LVH  Recent Labs: 09/20/2021: ALT 21; BUN 10; Creatinine, Ser 0.72; Hemoglobin 12.1; Platelets 333; Potassium 4.6; Sodium 144; TSH 1.010   Recent Lipid Panel No results found for: CHOL, TRIG, HDL, CHOLHDL, VLDL, LDLCALC, LDLDIRECT   Risk Assessment/Calculations:           Physical Exam:    VS  Vitals:   10/17/21 0915  BP: 122/70  Pulse: 87  SpO2: 99%     Wt Readings from Last 3 Encounters:  10/17/21 197 lb 12.8 oz (89.7 kg)  09/20/21 199 lb (90.3 kg)  04/29/21 200 lb 3.2 oz (90.8 kg)     GEN:  Well nourished, well developed in no acute distress HEENT: Normal NECK: No JVD LYMPHATICS: No lymphadenopathy CARDIAC: RRR, no murmurs, rubs, gallops RESPIRATORY:  Clear to auscultation without rales, wheezing or rhonchi  ABDOMEN: Soft, non-tender, non-distended MUSCULOSKELETAL:  No edema; No deformity  SKIN: Warm and dry NEUROLOGIC:  Alert and oriented x 3 PSYCHIATRIC:  Normal affect   ASSESSMENT:    Palpitations/LH: correlates with diarrheal episode. Suspect related to this. She has LVH, no sternal systolic murmur with valsalva, low suspicion of HOCM. No preexcitation or prolonged Qtc. We discussed that if she has persistent LH , dizziness or palpitations to let her PCP know and I am happy to see her again for further work up.  PLAN:    In order of problems listed above:  No further work up at this time      Medication Adjustments/Labs and Tests Ordered: Current medicines are reviewed at length with the patient today.  Concerns regarding medicines are outlined above.  Orders Placed This Encounter  Procedures   EKG 12-Lead   No orders of the defined types were placed in this encounter.   Patient Instructions  Medication Instructions:  Your physician recommends that you continue on  your current medications as directed. Please refer to the Current Medication list given to you today.  Follow-Up: At [facility], you and your health needs are our priority.  As part of our continuing mission to provide you with exceptional heart care, we have created designated Provider Care Teams.  These Care Teams include your primary Cardiologist (physician) and Advanced Practice Providers (APPs -  Physician Assistants and Nurse Practitioners) who all work together to provide you with the care you need, when you need it.  We recommend signing up for the patient portal called "MyChart".  Sign up information is provided on this After Visit Summary.  MyChart is  used to connect with patients for Virtual Visits (Telemedicine).  Patients are able to view lab/test results, encounter notes, upcoming appointments, etc.  Non-urgent messages can be sent to your provider as well.   To learn more about what you can do with MyChart, go to [email or web address].    Your next appointment:   AS NEEDED with Dr. Harl Bowie      Signed, Janina Mayo, MD  10/17/2021 9:41 AM    [place]

## 2021-10-17 ENCOUNTER — Ambulatory Visit: Payer: BC Managed Care – PPO | Admitting: Internal Medicine

## 2021-10-17 ENCOUNTER — Encounter: Payer: Self-pay | Admitting: Internal Medicine

## 2021-10-17 VITALS — BP 122/70 | HR 87 | Ht 59.0 in | Wt 197.8 lb

## 2021-10-17 DIAGNOSIS — R002 Palpitations: Secondary | ICD-10-CM

## 2021-10-17 NOTE — Patient Instructions (Signed)
Medication Instructions:  Your physician recommends that you continue on your current medications as directed. Please refer to the Current Medication list given to you today.  Follow-Up: At [facility], you and your health needs are our priority.  As part of our continuing mission to provide you with exceptional heart care, we have created designated Provider Care Teams.  These Care Teams include your primary Cardiologist (physician) and Advanced Practice Providers (APPs -  Physician Assistants and Nurse Practitioners) who all work together to provide you with the care you need, when you need it.  We recommend signing up for the patient portal called "MyChart".  Sign up information is provided on this After Visit Summary.  MyChart is used to connect with patients for Virtual Visits (Telemedicine).  Patients are able to view lab/test results, encounter notes, upcoming appointments, etc.  Non-urgent messages can be sent to your provider as well.   To learn more about what you can do with MyChart, go to [email or web address].    Your next appointment:   AS NEEDED with Dr. Harl Bowie

## 2021-10-30 ENCOUNTER — Ambulatory Visit: Payer: BC Managed Care – PPO | Admitting: Hematology

## 2021-10-30 ENCOUNTER — Other Ambulatory Visit (HOSPITAL_COMMUNITY): Payer: Self-pay

## 2021-10-30 ENCOUNTER — Other Ambulatory Visit: Payer: BC Managed Care – PPO

## 2021-10-30 MED ORDER — TELMISARTAN 40 MG PO TABS
40.0000 mg | ORAL_TABLET | Freq: Every day | ORAL | 1 refills | Status: AC
  Filled 2021-10-30: qty 90, 90d supply, fill #0

## 2021-10-30 MED ORDER — WEGOVY 1.7 MG/0.75ML ~~LOC~~ SOAJ
1.7000 mg | SUBCUTANEOUS | 0 refills | Status: DC
  Filled 2021-10-30 – 2022-03-17 (×2): qty 3, 28d supply, fill #0

## 2021-10-30 MED ORDER — WEGOVY 0.5 MG/0.5ML ~~LOC~~ SOAJ
0.5000 mg | SUBCUTANEOUS | 0 refills | Status: DC
  Filled 2021-10-30: qty 2, 28d supply, fill #0

## 2021-10-30 MED ORDER — WEGOVY 1 MG/0.5ML ~~LOC~~ SOAJ
1.0000 mg | SUBCUTANEOUS | 0 refills | Status: DC
  Filled 2021-10-30: qty 2, 28d supply, fill #0

## 2021-10-30 MED ORDER — ROSUVASTATIN CALCIUM 20 MG PO TABS
20.0000 mg | ORAL_TABLET | Freq: Every day | ORAL | 3 refills | Status: DC
  Filled 2021-10-30: qty 90, 90d supply, fill #0

## 2021-11-19 ENCOUNTER — Other Ambulatory Visit: Payer: Self-pay

## 2021-11-19 DIAGNOSIS — C50411 Malignant neoplasm of upper-outer quadrant of right female breast: Secondary | ICD-10-CM

## 2021-11-20 ENCOUNTER — Inpatient Hospital Stay: Payer: BC Managed Care – PPO | Admitting: Hematology

## 2021-11-20 ENCOUNTER — Inpatient Hospital Stay: Payer: BC Managed Care – PPO | Attending: Nurse Practitioner

## 2021-11-20 ENCOUNTER — Encounter: Payer: Self-pay | Admitting: Hematology

## 2021-11-20 ENCOUNTER — Other Ambulatory Visit: Payer: Self-pay

## 2021-11-20 VITALS — BP 147/95 | HR 86 | Temp 98.7°F | Resp 16 | Ht 59.0 in | Wt 206.0 lb

## 2021-11-20 DIAGNOSIS — N926 Irregular menstruation, unspecified: Secondary | ICD-10-CM | POA: Diagnosis not present

## 2021-11-20 DIAGNOSIS — C50411 Malignant neoplasm of upper-outer quadrant of right female breast: Secondary | ICD-10-CM

## 2021-11-20 DIAGNOSIS — I890 Lymphedema, not elsewhere classified: Secondary | ICD-10-CM | POA: Diagnosis not present

## 2021-11-20 DIAGNOSIS — Z170 Estrogen receptor positive status [ER+]: Secondary | ICD-10-CM | POA: Diagnosis not present

## 2021-11-20 DIAGNOSIS — Z79810 Long term (current) use of selective estrogen receptor modulators (SERMs): Secondary | ICD-10-CM | POA: Diagnosis not present

## 2021-11-20 LAB — CMP (CANCER CENTER ONLY)
ALT: 11 U/L (ref 0–44)
AST: 10 U/L — ABNORMAL LOW (ref 15–41)
Albumin: 4.2 g/dL (ref 3.5–5.0)
Alkaline Phosphatase: 65 U/L (ref 38–126)
Anion gap: 5 (ref 5–15)
BUN: 12 mg/dL (ref 6–20)
CO2: 30 mmol/L (ref 22–32)
Calcium: 9.7 mg/dL (ref 8.9–10.3)
Chloride: 106 mmol/L (ref 98–111)
Creatinine: 0.78 mg/dL (ref 0.44–1.00)
GFR, Estimated: >60 mL/min (ref >60)
Glucose, Bld: 91 mg/dL (ref 70–99)
Potassium: 3.9 mmol/L (ref 3.5–5.1)
Sodium: 141 mmol/L (ref 135–145)
Total Bilirubin: 0.6 mg/dL (ref 0.3–1.2)
Total Protein: 7 g/dL (ref 6.5–8.1)

## 2021-11-20 LAB — CBC WITH DIFFERENTIAL (CANCER CENTER ONLY)
Abs Immature Granulocytes: 0.03 10*3/uL (ref 0.00–0.07)
Basophils Absolute: 0 10*3/uL (ref 0.0–0.1)
Basophils Relative: 1 %
Eosinophils Absolute: 0.1 10*3/uL (ref 0.0–0.5)
Eosinophils Relative: 2 %
HCT: 35 % — ABNORMAL LOW (ref 36.0–46.0)
Hemoglobin: 11.8 g/dL — ABNORMAL LOW (ref 12.0–15.0)
Immature Granulocytes: 1 %
Lymphocytes Relative: 33 %
Lymphs Abs: 2 10*3/uL (ref 0.7–4.0)
MCH: 32.2 pg (ref 26.0–34.0)
MCHC: 33.7 g/dL (ref 30.0–36.0)
MCV: 95.6 fL (ref 80.0–100.0)
Monocytes Absolute: 0.5 10*3/uL (ref 0.1–1.0)
Monocytes Relative: 8 %
Neutro Abs: 3.4 10*3/uL (ref 1.7–7.7)
Neutrophils Relative %: 55 %
Platelet Count: 318 10*3/uL (ref 150–400)
RBC: 3.66 MIL/uL — ABNORMAL LOW (ref 3.87–5.11)
RDW: 12.2 % (ref 11.5–15.5)
WBC Count: 6.1 10*3/uL (ref 4.0–10.5)
nRBC: 0 % (ref 0.0–0.2)

## 2021-11-20 MED ORDER — TAMOXIFEN CITRATE 10 MG PO TABS: 10.0000 mg | ORAL_TABLET | Freq: Two times a day (BID) | ORAL | 1 refills | Status: DC

## 2021-11-20 NOTE — Progress Notes (Signed)
[place]   [phone] [phone]   Clinic Follow up Note   Patient Care Team: Deland Pretty, MD as PCP - General (Internal Medicine) Mauro Kaufmann, RN as Oncology Nurse Navigator Rockwell Germany, RN as Oncology Nurse Navigator Jovita Kussmaul, MD as Consulting Physician (General Surgery) Truitt Merle, MD as Consulting Physician (Hematology) Gery Pray, MD as Consulting Physician (Radiation Oncology) Alla Feeling, NP as Nurse Practitioner (Nurse Practitioner)  Date of Service:  11/20/2021  CHIEF COMPLAINT: f/u of right breast cancer  CURRENT THERAPY:  Tamoxifen, started 06/2021  ASSESSMENT & PLAN:  Carla Patrick is a 56 y.o. peri-menopausal female with   1.  Malignant neoplasm of upper-outer quadrant of right breast, Invasive Lobular Carcinoma, Stage IA, p(T1c, N0(i+)), ER+/PR-/HER2-, Grade 2, LCIS(+), RS 22 -found on screening mammogram. S/p right lumpectomy on 01/30/21 with Dr. Marlou Starks. Pathology showed 1.5 cm ILC and lobular neoplasia, margins uninvolved. One lymph node showed isolated tumor cells, otherwise no carcinoma and is considered pN0. -Oncotype RS of 22, low risk -she proceeded to adjuvant radiation under Dr. Sondra Come 10/17-11/14/22. -she started tamoxifen in 06/2021 but experienced debilitating side effects on full dose-- severe fatigue, hot flashes, and nausea. She has tried alternating her dose between 20 and 10 mg. She recently restarted on 10 mg daily and tolerating low-dose much better.  I encouraged her to continue tamoxifen 10 mg daily, and use 20 mg periodically when she can. -I previously discussed switching Tamoxifen to AI when she became postmenopausal in next few years. She is having irregular periods, especially on tamoxifen. -labs reviewed, overall stable. Physical exam was unremarkable aside from some lymphedema. There is no clinical concern for recurrence. -I will refer her to PT to help with her  lymphedema. -continue surveillance. She is due for annual mammogram now, I ordered today.     PLAN:  -continue tamoxifen at 10 mg,refilled for her.  She may change to 20 mg daily periodically if she can tolerate. -mammogram due now, ordered today -referral to PT for right breast lymphedema -survivorship in 4 months -lab and f/u in 10 months   No problem-specific Assessment & Plan notes found for this encounter.   SUMMARY OF ONCOLOGIC HISTORY: Oncology History Overview Note   Cancer Staging  Malignant neoplasm of upper-outer quadrant of right breast in female, estrogen receptor positive ([place]) Staging form: Breast, AJCC 8th Edition - Clinical stage from 12/26/2020: Stage IA (cT1b, cN0, cM0, G2, ER+, PR-, HER2-) - Unsigned Stage prefix: Initial diagnosis Histologic grading system: 3 grade system Staged by: Pathologist and managing physician Stage used in treatment planning: Yes National guidelines used in treatment planning: Yes Type of national guideline used in treatment planning: NCCN - Pathologic stage from 01/12/2021: Stage IA (pT1c, pN0(i+), cM0, G2, ER+, PR-, HER2-, Oncotype DX score: 22) - Signed by Truitt Merle, MD on 04/27/2021 Stage prefix: Initial diagnosis Multigene prognostic tests performed: Oncotype DX Recurrence score range: Greater than or equal to 11 Histologic grading system: 3 grade system Residual tumor (R): R0 - None    Malignant neoplasm of upper-outer quadrant of right breast in female, estrogen receptor positive ([place])  12/05/2020 Mammogram   Right Breast Diagnostic Mammogram; Right Breast Ultrasound  IMPRESSION: The 0.4 x 0.5 x 0.7 cm taller-than-wide irregular mass in the right breast is suspicious of malignancy. No significant abnormalities were seen sonographically in the right axilla.   12/17/2020 Pathology Results   Diagnosis:  Breast, right, needle core biopsy, 12 O'CLOCK middle depth,  9 cmfn - INVASIVE MAMMARY CARCINOMA - MAMMARY CARCINOMA IN  SITU  The carcinoma appears Nottingham grade 2 of 3. E-cadherin is negative in the invasive carcinoma, supporting lobular origin.  PROGNOSTIC INDICATORS Results: IMMUNOHISTOCHEMICAL AND MORPHOMETRIC ANALYSIS PERFORMED MANUALLY The tumor cells are EQUIVOCAL for Her2 (2+). Her2 by FISH will be performed and results reported separately. Estrogen Receptor: 90%, POSITIVE, MODERATE STAINING INTENSITY Progesterone Receptor: 0%, NEGATIVE Proliferation Marker Ki67: 1%  FLUORESCENCE IN-SITU HYBRIDIZATION Results: GROUP 5: HER 2 **NEGATIVE** Equivocal form of amplification of the HER2 gene was detected in the IHC 2+ tissue sample received from this individual. HER2 FISH was performed by a technologist and cell imaging and analysis on the [employer]. RATIO OF ER2/CEN17 SIGNALS 1.15 AVERAGE HER2 COPY NUMBER PER CELL 1.95   12/24/2020 Initial Diagnosis   Malignant neoplasm of upper-outer quadrant of right breast in female, estrogen receptor positive ([place])   01/12/2021 Cancer Staging   Staging form: Breast, AJCC 8th Edition - Pathologic stage from 01/12/2021: Stage IA (pT1c, pN0(i+), cM0, G2, ER+, PR-, HER2-, Oncotype DX score: 22) - Signed by Truitt Merle, MD on 04/27/2021 Stage prefix: Initial diagnosis Multigene prognostic tests performed: Oncotype DX Recurrence score range: Greater than or equal to 11 Histologic grading system: 3 grade system Residual tumor (R): R0 - None   01/30/2021 Definitive Surgery   FINAL MICROSCOPIC DIAGNOSIS:   A. LYMPH NODE, RIGHT AXILLARY #1, SENTINEL, EXCISION:  -  Isolated tumor cells identified in one lymph node (0i/1)  -  See comment   B. LYMPH NODE, RIGHT AXILLARY, SENTINEL, EXCISION:  -  No carcinoma identified in one lymph node (0/1)  -  See comment   C. LYMPH NODE, RIGHT AXILLARY #2, SENTINEL, EXCISION:  -  No carcinoma identified in one lymph node (0/1)  -  See comment   D. LYMPH NODE, RIGHT AXILLARY, SENTINEL, EXCISION:  -  No carcinoma identified in one  lymph node (0/1)  -  See comment   E. LYMPH NODE, RIGHT AXILLARY #3, SENTINEL, EXCISION:  -  No carcinoma identified in one lymph node (0/1)  -  See comment   F. LYMPH NODE, RIGHT AXILLARY #4, SENTINEL, EXCISION:  -  No carcinoma identified in one lymph node (0/1)  -  See comment   G. LYMPH NODE, RIGHT AXILLARY, SENTINEL, EXCISION:  -  No carcinoma identified in one lymph node (0/1)  -  See comment   H. LYMPH NODE, RIGHT AXILLARY #5, SENTINEL, EXCISION:  -  Benign fibroadipose tissue  -  No lymphoid tissue identified   I. BREAST, RIGHT, LUMPECTOMY:  -  Invasive lobular carcinoma, Nottingham grade 2 of 3, 1.5 cm  -  Lobular neoplasia (atypical lobular hyperplasia)  -  Margins uninvolved by carcinoma (0.7 cm; inferior margin)  -  Previous biopsy site changes present  -  See oncology table and comment below    01/30/2021 Miscellaneous   Oncotype Recurrence Score of 22, risk of distant metastasis is 8% with antiestrogen therapy alone.   03/18/2021 - 04/15/2021 Radiation Therapy   Site Technique Total Dose (Gy) Dose per Fx (Gy) Completed Fx Beam Energies  Breast, Right: Breast_Rt 3D 40.05/40.05 2.67 15/15 10X  Breast, Right: Breast_Rt_Bst 3D 10/10 2 5/5 6X, 10X        INTERVAL HISTORY:  Carla Patrick is here for a follow up of breast cancer. She was last seen by me on 04/29/21. She presents to the clinic alone. She reports she recently restarted tamoxifen at 10 mg. She  explains she had difficulty tolerating the 20 mg in part due to side effects affecting her ability to work-- she reports her energy level was 40%, she had bothersome hot flashes, and she had some nausea, which was worsened when she took it with her 50.   All other systems were reviewed with the patient and are negative.  MEDICAL HISTORY:  Past Medical History:  Diagnosis Date   Abnormal EKG    Anemia    Anxiety    Asthma    Cancer ([place]) 11/2020   right breast [facility]   Depression    Heart murmur     History of radiation therapy    Right Breast- 03/18/21-04/15/21-Dr. Gery Pray   Hyperlipidemia    Hypertension     SURGICAL HISTORY: Past Surgical History:  Procedure Laterality Date   BREAST EXCISIONAL BIOPSY  2007   left   BREAST LUMPECTOMY WITH RADIOACTIVE SEED AND SENTINEL LYMPH NODE BIOPSY Right 01/30/2021   Procedure: RIGHT BREAST LUMPECTOMY WITH RADIOACTIVE SEED AND SENTINEL LYMPH NODE BIOPSY;  Surgeon: Jovita Kussmaul, MD;  Location: [facility];  Service: General;  Laterality: Right;   CHOLECYSTECTOMY  10/04/2011   Procedure: LAPAROSCOPIC CHOLECYSTECTOMY WITH INTRAOPERATIVE CHOLANGIOGRAM;  Surgeon: Rolm Bookbinder, MD;  Location: [facility];  Service: General;  Laterality: N/A;    I have reviewed the social history and family history with the patient and they are unchanged from previous note.  ALLERGIES:  has No Known Allergies.  MEDICATIONS:  Current Outpatient Medications  Medication Sig Dispense Refill   cholecalciferol (VITAMIN D3) 25 MCG (1000 UNIT) tablet Take 1,000 Units by mouth daily.     folic acid (FOLVITE) 622 MCG tablet Take 400 mcg by mouth daily.     hydrochlorothiazide (HYDRODIURIL) 25 MG tablet Take 12.5 mg by mouth daily.     Multiple Vitamins-Iron (MULTIVITAMINS WITH IRON) TABS tablet 1 tablet     PROAIR HFA 108 (90 BASE) MCG/ACT inhaler      rosuvastatin (CRESTOR) 20 MG tablet Take 20 mg by mouth daily.     rosuvastatin (CRESTOR) 20 MG tablet Take 1 tablet (20 mg total) by mouth daily. 90 tablet 3   Semaglutide-Weight Management (WEGOVY [place]) Inject into the skin.     Semaglutide-Weight Management (WEGOVY) 0.5 MG/0.5ML SOAJ Inject 0.5 mg into the skin once a week. 2 mL 0   Semaglutide-Weight Management (WEGOVY) 1 MG/0.5ML SOAJ Inject 1 mg into the skin once a week. 2 mL 0   Semaglutide-Weight Management (WEGOVY) 1.7 MG/0.75ML SOAJ Inject 1.7 mg into the skin once a week. 3 mL 0   SYMBICORT 160-4.5 MCG/ACT inhaler SMARTSIG:2 Puff(s) By Mouth  Twice Daily PRN     tamoxifen (NOLVADEX) 20 MG tablet Take 1 tablet (20 mg total) by mouth daily. 30 tablet 3   telmisartan (MICARDIS) 40 MG tablet Take 1 tablet (40 mg total) by mouth daily. 90 tablet 1   telmisartan (MICARDIS) 80 MG tablet Take 80 mg by mouth daily.     No current facility-administered medications for this visit.    PHYSICAL EXAMINATION: ECOG PERFORMANCE STATUS: 1 - Symptomatic but completely ambulatory  Vitals:   11/20/21 1233  BP: (!) 147/95  Pulse: 86  Resp: 16  Temp: 98.7 F (37.1 C)  SpO2: 100%   Wt Readings from Last 3 Encounters:  11/20/21 206 lb (93.4 kg)  10/17/21 197 lb 12.8 oz (89.7 kg)  09/20/21 199 lb (90.3 kg)     GENERAL:alert, no distress and comfortable SKIN: skin  color, texture, turgor are normal, no rashes or significant lesions EYES: normal, Conjunctiva are pink and non-injected, sclera clear  NECK: supple, thyroid normal size, non-tender, without nodularity LYMPH:  no palpable lymphadenopathy in the cervical, axillary, (+) mild right lymphedema LUNGS: clear to auscultation and percussion with normal breathing effort HEART: regular rate & rhythm and no murmurs and no lower extremity edema ABDOMEN:abdomen soft, non-tender and normal bowel sounds Musculoskeletal:no cyanosis of digits and no clubbing  NEURO: alert & oriented x 3 with fluent speech, no focal motor/sensory deficits BREAST: No palpable mass, nodules or adenopathy bilaterally. Breast exam benign.   LABORATORY DATA:  I have reviewed the data as listed    Latest Ref Rng & Units 11/20/2021   11:48 AM 09/20/2021   11:22 AM 12/26/2020   12:43 PM  CBC  WBC 4.0 - 10.5 K/uL 6.1  5.3  7.5   Hemoglobin 12.0 - 15.0 g/dL 11.8  12.1  11.6   Hematocrit 36.0 - 46.0 % 35.0  36.5  34.6   Platelets 150 - 400 K/uL 318  333  353         Latest Ref Rng & Units 11/20/2021   11:48 AM 09/20/2021   11:22 AM 01/29/2021    3:00 PM  CMP  Glucose 70 - 99 mg/dL 91  103  134   BUN 6 - 20 mg/dL  $Remo'12  10  15   'lBLdf$ Creatinine 0.44 - 1.00 mg/dL 0.78  0.72  0.82   Sodium 135 - 145 mmol/L 141  144  139   Potassium 3.5 - 5.1 mmol/L 3.9  4.6  4.2   Chloride 98 - 111 mmol/L 106  103  103   CO2 22 - 32 mmol/L $RemoveB'30  23  26   'bHTdEOOm$ Calcium 8.9 - 10.3 mg/dL 9.7  9.4  9.5   Total Protein 6.5 - 8.1 g/dL 7.0  6.7    Total Bilirubin 0.3 - 1.2 mg/dL 0.6  0.6    Alkaline Phos 38 - 126 U/L 65  83    AST 15 - 41 U/L 10  16    ALT 0 - 44 U/L 11  21        RADIOGRAPHIC STUDIES: I have personally reviewed the radiological images as listed and agreed with the findings in the report. No results found.    Orders Placed This Encounter  Procedures   MM DIAG BREAST TOMO BILATERAL    Standing Status:   Future    Standing Expiration Date:   11/21/2022    Scheduling Instructions:     [facility]    Order Specific Question:   Reason for Exam (SYMPTOM  OR DIAGNOSIS REQUIRED)    Answer:   screening    Order Specific Question:   Preferred imaging location?    Answer:   External    Order Specific Question:   Is the patient pregnant?    Answer:   No   Ambulatory referral to Physical Therapy    Referral Priority:   Routine    Referral Type:   Physical Medicine    Referral Reason:   Specialty Services Required    Requested Specialty:   Physical Therapy    Number of Visits Requested:   1   All questions were answered. The patient knows to call the clinic with any problems, questions or concerns. No barriers to learning was detected. The total time spent in the appointment was 30 minutes.     Truitt Merle, MD 11/20/2021  I, Wilburn Mylar, am acting as scribe for Truitt Merle, MD.   I have reviewed the above documentation for accuracy and completeness, and I agree with the above.

## 2021-11-22 ENCOUNTER — Telehealth: Payer: Self-pay | Admitting: Hematology

## 2021-11-22 ENCOUNTER — Ambulatory Visit: Payer: BC Managed Care – PPO | Attending: Hematology | Admitting: Rehabilitation

## 2021-11-22 ENCOUNTER — Encounter: Payer: Self-pay | Admitting: Rehabilitation

## 2021-11-22 ENCOUNTER — Other Ambulatory Visit: Payer: Self-pay

## 2021-11-22 DIAGNOSIS — C50411 Malignant neoplasm of upper-outer quadrant of right female breast: Secondary | ICD-10-CM | POA: Insufficient documentation

## 2021-11-22 DIAGNOSIS — M25611 Stiffness of right shoulder, not elsewhere classified: Secondary | ICD-10-CM | POA: Insufficient documentation

## 2021-11-22 DIAGNOSIS — R293 Abnormal posture: Secondary | ICD-10-CM | POA: Insufficient documentation

## 2021-11-22 DIAGNOSIS — Z483 Aftercare following surgery for neoplasm: Secondary | ICD-10-CM | POA: Insufficient documentation

## 2021-11-22 DIAGNOSIS — Z170 Estrogen receptor positive status [ER+]: Secondary | ICD-10-CM | POA: Insufficient documentation

## 2021-11-22 DIAGNOSIS — I890 Lymphedema, not elsewhere classified: Secondary | ICD-10-CM | POA: Insufficient documentation

## 2021-11-26 ENCOUNTER — Ambulatory Visit: Payer: BC Managed Care – PPO

## 2021-11-26 ENCOUNTER — Telehealth: Payer: Self-pay | Admitting: Rehabilitation

## 2021-11-26 DIAGNOSIS — M25611 Stiffness of right shoulder, not elsewhere classified: Secondary | ICD-10-CM

## 2021-11-26 DIAGNOSIS — Z483 Aftercare following surgery for neoplasm: Secondary | ICD-10-CM

## 2021-11-26 DIAGNOSIS — I890 Lymphedema, not elsewhere classified: Secondary | ICD-10-CM

## 2021-11-26 DIAGNOSIS — C50411 Malignant neoplasm of upper-outer quadrant of right female breast: Secondary | ICD-10-CM | POA: Diagnosis not present

## 2021-11-26 DIAGNOSIS — R293 Abnormal posture: Secondary | ICD-10-CM

## 2021-11-27 NOTE — Telephone Encounter (Signed)
Returned phone call regarding starting the flexitouch approval process.  Insurance information will be sent today

## 2021-11-28 ENCOUNTER — Ambulatory Visit: Payer: BC Managed Care – PPO

## 2021-11-28 DIAGNOSIS — M25611 Stiffness of right shoulder, not elsewhere classified: Secondary | ICD-10-CM

## 2021-11-28 DIAGNOSIS — C50411 Malignant neoplasm of upper-outer quadrant of right female breast: Secondary | ICD-10-CM

## 2021-11-28 DIAGNOSIS — I890 Lymphedema, not elsewhere classified: Secondary | ICD-10-CM

## 2021-11-28 DIAGNOSIS — Z483 Aftercare following surgery for neoplasm: Secondary | ICD-10-CM

## 2021-11-28 DIAGNOSIS — R293 Abnormal posture: Secondary | ICD-10-CM

## 2021-11-28 NOTE — Therapy (Signed)
OUTPATIENT PHYSICAL THERAPY ONCOLOGY EVALUATION  Patient Name: Carla Patrick MRN: [id number] DOB:1965-10-27, 56 y.o., female Today's Date: 11/28/2021   PT End of Session - 11/28/21 1223     Visit Number 3    Number of Visits 25    Date for PT Re-Evaluation 02/14/22    PT Start Time 1110    PT Stop Time 9892    PT Time Calculation (min) 65 min    Activity Tolerance Patient tolerated treatment well    Behavior During Therapy [facility] for tasks assessed/performed              Past Medical History:  Diagnosis Date   Abnormal EKG    Anemia    Anxiety    Asthma    Cancer ([place]) 11/2020   right breast [facility]   Depression    Heart murmur    History of radiation therapy    Right Breast- 03/18/21-04/15/21-Dr. Gery Pray   Hyperlipidemia    Hypertension    Past Surgical History:  Procedure Laterality Date   BREAST EXCISIONAL BIOPSY  2007   left   BREAST LUMPECTOMY WITH RADIOACTIVE SEED AND SENTINEL LYMPH NODE BIOPSY Right 01/30/2021   Procedure: RIGHT BREAST LUMPECTOMY WITH RADIOACTIVE SEED AND SENTINEL LYMPH NODE BIOPSY;  Surgeon: Jovita Kussmaul, MD;  Location: [place];  Service: General;  Laterality: Right;   CHOLECYSTECTOMY  10/04/2011   Procedure: LAPAROSCOPIC CHOLECYSTECTOMY WITH INTRAOPERATIVE CHOLANGIOGRAM;  Surgeon: Rolm Bookbinder, MD;  Location: [facility];  Service: General;  Laterality: N/A;   Patient Active Problem List   Diagnosis Date Noted   Malignant neoplasm of upper-outer quadrant of right breast in female, estrogen receptor positive ([place]) 12/24/2020   Right knee pain 11/08/2013   Abnormal EKG 07/15/2013    PCP: Deland Pretty, MD  REFERRING PROVIDER: Dr. Truitt Merle  REFERRING DIAG: C50.411,Z17.0 (ICD-10-CM) - Malignant neoplasm of upper-outer quadrant of right breast in female, estrogen receptor positive ([place])  THERAPY DIAG:  Malignant neoplasm of upper-outer quadrant of right breast in female, estrogen receptor positive ([place])  Abnormal  posture  Aftercare following surgery for neoplasm  Stiffness of right shoulder, not elsewhere classified  Lymphedema, not elsewhere classified  ONSET DATE: 01/30/21  Rationale for Evaluation and Treatment Rehabilitation  SUBJECTIVE                                                                                                                                                                                           SUBJECTIVE STATEMENT: I thought my appt was tomorrow so my husband isn't here today. I really struggled with typing with the bandage on. Can we  wrap my hand less?   PERTINENT HISTORY:  Rt lumpectomy on 01/30/21 with 1/8 positive lymph node removed.  Completed radiation 04/15/21.  PAIN:  Are you having pain? No Breast pain intermittently - for no reason  PRECAUTIONS: Rt lymphedema risk  WEIGHT BEARING RESTRICTIONS No  FALLS:  Has patient fallen in last 6 months? 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[employer]   LEISURE: walking  HAND DOMINANCE : right   PRIOR LEVEL OF FUNCTION: Independent  PATIENT GOALS get swelling down all over   OBJECTIVE  Pt permission and consent throughout each step of examination and treatment with modification and draping if requested when working on sensitive areas   COGNITION:  Overall cognitive status: Within functional limits for tasks assessed   PALPATION: Breast fibrosis moderate medial and inferior lateral breast  OBSERVATIONS / OTHER ASSESSMENTS: Rt arm larger in seated, enlarged pores entire breast and nipple enlarged due to edema  POSTURE: WFL  UPPER EXTREMITY AROM/PROM:  A/PROM RIGHT   eval   Shoulder extension   Shoulder flexion 155 - pull  Shoulder abduction 153 - pull  Shoulder internal rotation   Shoulder external rotation     (Blank rows = not tested)  A/PROM LEFT   eval  Shoulder extension   Shoulder flexion 165   Shoulder abduction 160  Shoulder internal rotation   Shoulder external rotation     (Blank rows = not tested)  LYMPHEDEMA ASSESSMENTS:  LANDMARK RIGHT  eval 11/28/21      15cm proximal to olecranon  41.5 39.5  10 cm proximal to olecranon process 39.5 37.6  Olecranon process 27.5 27.3  15 cm proximal to ulnar styloid 28.5 28.4  10 cm proximal to ulnar styloid process 24.5 25  Just proximal to ulnar styloid process 18.2 16.4  Across hand at thumb web space 18.2 18.6  At base of 2nd digit 7.0 6.5  (Blank rows = not tested) 2858m  LANDMARK LEFT  eval     15cm proximal to olecranon  40.2  10 cm proximal to olecranon process 38  Olecranon process 26.5  15 cm proximal to ulnar styloid 26.5  10 cm proximal to ulnar styloid process 22.8  Just proximal to ulnar styloid process 15.7  Across hand at thumb web space 17.5  At base of 2nd digit 6.6  (Blank rows = not tested) 25436mDifference of 28072mBreast edema questionnaire: 49/80: 61%  TODAY'S TREATMENT   11/28/21:  Manual Therapy Compression Bandaging: Began instructing pt in this today. Performed with her Elastomull to fingers 1-4 performing 2 revolutions at each finger to allow for greater ease for her with typing at work, and then 1-6 cm at hand with less revolutions so finished this on forearm, and then 1-12 cm spiral from wrist to axilla. Pt able to return therapist demo and handout issued. She will benefit from continued review/instruction of this. Then Cocoa butter applied to Rt UE, TG soft (so she could wash this when on vacation next week, Elastomull to fingers 1-4, (pt requested trying no artiflex) 1-6 cm short stretch to hand, then 1-10 and 1-12 to arm from wrist to axilla  Also showed pt nighttime garment options on [email or web address].pthe is interested in pursuing this if she fits into one. She reports also having cancer insurance and will see if this will help cover as she hasn't met her deductible yet.   11/26/21:  Manual  Therapy MLD: In supine: Short neck, 5 diaphragmatic breaths, Lt axillary nodes and establishment of anterior inter-axillary pathway, Rt inguinal nodes and establishment of Rt axillo-inguinal pathway,then Rt breast moving fluid towards pathways spending extra time in any areas of fibrosis then retracing all steps and included Rt UE as well today, working from proximal to distal then, into Lt S/L for further work to lateral aspect of breast and Rt axillo-inguinal nd posterior inter-axillary anastomosis, then finished in supine retracing all steps continuing to instruct pt throughout and offer hand over hand pressure for instruction in skin stretch, not slide. Pt was encouraged to cont self MLD of the Rt breast while UE is bandaged.  Compression Bandaging: Cocoa butter applied to Rt UE, thin stockinette, Elastomull to fingers 1-4, Artiflex x1, 1-6 cm short stretch to haand, then 1-10 and 1-12 to arm from wrist to axilla beginning to explain sequence and technique while applying. Pt reports Rt UE feeling comfortable and was briefly instructed in remedial exercises.  11/22/21:  Education on pt having lymphedema at this point and that we need to start CDT and more consistent treatment to prevent the arm from getting larger.  Supine education on self MLD for the Rt breast with education on anatomy and physiology and reasons for technique In supine: Short neck, 5 diaphragmatic breaths, bil axillary nodes and establishment of interaxillary pathway, R inguinal nodes and establishment of axillo-inguinal pathway,then R breast moving fluid towards pathways spending extra time in any areas of fibrosis then retracing all steps. With vcs, tcs, and hand over hand cueing for performance Pt encouraged to wear compression bra   PATIENT EDUCATION:  Education details: per today's section Person educated: Patient Education method: Explanation, Demonstration, Tactile cues, Verbal cues, and  Handouts Education comprehension: verbalized understanding, returned demonstration, verbal cues required, tactile cues required, and needs further education  HOME EXERCISE PROGRAM: Self MLD Rt breast  GARMENTS: sent demo to [place] 11/22/21 for future garments which stated she has 80% coverage once the deductible has been met which is not yet met, has compression bra  ASSESSMENT:  CLINICAL IMPRESSION: Patient returns still wearing banadage from last session though this had slid down her arm a lot due to circumferential reductions. She requested les bandage at hand so attempted this today though did thoroughly instruct her that if she starts noticing increase swelling at her fingers and/or hand then we have to go back to original way of bandaging and she was able to verbalize good understanding of this.  Instructed her in compression bandaging today by having pt return therapist demonstration. Also issued info on how to order elastomull from [email or web address] and printout issued. Pt will benefit from further review and hopes her husband can come to a future appt when they return from vacation. She also hopes to purchase a nighttime garment (left VM that she could try a Circaid Profile size III EW, but can return if it doesn't fit well.   OBJECTIVE IMPAIRMENTS decreased activity tolerance, decreased knowledge of condition, decreased knowledge of use of DME, decreased ROM, increased edema, and increased fascial restrictions.   ACTIVITY LIMITATIONS carrying, lifting, and reach over head  PARTICIPATION LIMITATIONS: community activity  PERSONAL FACTORS Time since onset of injury/illness/exacerbation and 1-2 comorbidities: lymph node removal and radiation  are also affecting patient's functional outcome.   REHAB POTENTIAL: Good  CLINICAL DECISION MAKING: Evolving/moderate complexity  EVALUATION COMPLEXITY: Moderate  GOALS: Goals reviewed with patient? Yes  SHORT TERM GOALS: Target date:  12/20/2021  Pt will be ind with self MLD for the Rt breast and arm  Baseline: Goal status: INITIAL  2.  Pt will be ind with self bandaging for the Rt UE with the help of her spouse as needed Baseline:  Goal status: INITIAL  LONG TERM GOALS: Target date: 01/17/2022    Pt will decrease volume by at least 132m to demonstrate decreased risk of infection and fibrosis Baseline:  Goal status: INITIAL  2.  Pt will obtain appropriate compression for the arm and hand Baseline:  Goal status: INITIAL  3.  Pt will be ind with final HEP Baseline:  Goal status: INITIAL  PLAN: PT FREQUENCY: 3x/week  PT DURATION: 8 weeks  PLANNED INTERVENTIONS: Therapeutic exercises, Patient/Family education, Orthotic/Fit training, Prosthetic training, DME instructions, Manual lymph drainage, scar mobilization, Taping, Vasopneumatic device, Manual therapy, and Re-evaluation  PLAN FOR NEXT SESSION:  How did self bandaging go? Instruct husband in compression bandaging of Rt UE, Cont MLD to the Rt breast and UE, make foam, and shoulder ROM activities    ROtelia Limes PTA 11/28/2021, 12:24 PM  Manual Lymph Drainage for Right Breast. Do daily. Do slowly. Use flat hands with just enough pressure to stretch the skin. Do not slide over the skin, stretch the skin with the hand. (Stretch ? Relax ? Move) Lie down or sit comfortably (in a recliner, for example) to do this. 1. Hug yourself: cross arms and do circles at collar bones near neck 5-7 times (to "wake up" lots  of lymph nodes in this area). 2. Take slow deep breaths, allowing your belly to balloon out as you breathe in, 5x (to "wake  up" abdominal lymph nodes). 3. Both armpits--stretch skin in small circles to stimulate intact lymph nodes there, 5-7x. 4. Right groin area, at panty line--stretch skin in small circles to stimulate lymph nodes 5-7x. 5. Redirect fluid from right chest toward left armpit (stretch skin starting at right chest in 3-4  spots  working toward left armpit) 3-4x across the chest. 6. Redirect fluid from right armpit toward right groin (cup your hand around the curve of your right side and do 3-4 "pumps" from armpit to groin) 3-4x down your side. 7. Draw an imaginary diagonal line from upper outer breast through the nipple area toward lower  inner breast. Direct fluid upward and inward from this line toward the pathway across your  upper chest (established in #5). Do this in three rows to treat all the upper inner breast and do  each row 3-4x. 8. Then direct the fluid down and out from this line toward the pathway down your side going  towards the left groin (established in #6). Do this in three rows and do each row 3-4x.  9. Then repeat your pathways (#5 and #6) 10.End with repeating #3 and #4 above. (circles in both armpits and the right groin

## 2021-12-08 NOTE — Therapy (Signed)
OUTPATIENT PHYSICAL THERAPY ONCOLOGY TREATMENT  Patient Name: Carla Patrick MRN: [id number] DOB:12-24-1965, 56 y.o., female Today's Date: 12/08/2021      Past Medical History:  Diagnosis Date   Abnormal EKG    Anemia    Anxiety    Asthma    Cancer ([facility]) 11/2020   right breast [facility]   Depression    Heart murmur    History of radiation therapy    Right Breast- 03/18/21-04/15/21-Dr. Antony Blackbird   Hyperlipidemia    Hypertension    Past Surgical History:  Procedure Laterality Date   BREAST EXCISIONAL BIOPSY  2007   left   BREAST LUMPECTOMY WITH RADIOACTIVE SEED AND SENTINEL LYMPH NODE BIOPSY Right 01/30/2021   Procedure: RIGHT BREAST LUMPECTOMY WITH RADIOACTIVE SEED AND SENTINEL LYMPH NODE BIOPSY;  Surgeon: Griselda Miner, MD;  Location: [facility];  Service: General;  Laterality: Right;   CHOLECYSTECTOMY  10/04/2011   Procedure: LAPAROSCOPIC CHOLECYSTECTOMY WITH INTRAOPERATIVE CHOLANGIOGRAM;  Surgeon: Emelia Loron, MD;  Location: [facility];  Service: General;  Laterality: N/A;   Patient Active Problem List   Diagnosis Date Noted   Malignant neoplasm of upper-outer quadrant of right breast in female, estrogen receptor positive ([facility]) 12/24/2020   Right knee pain 11/08/2013   Abnormal EKG 07/15/2013    PCP: Merri Brunette, MD  REFERRING PROVIDER: Dr. Malachy Mood  REFERRING DIAG: C50.411,Z17.0 (ICD-10-CM) - Malignant neoplasm of upper-outer quadrant of right breast in female, estrogen receptor positive ([facility])  THERAPY DIAG:  No diagnosis found.  ONSET DATE: 01/30/21  Rationale for Evaluation and Treatment Rehabilitation  SUBJECTIVE                                                                                                                                                                                           SUBJECTIVE STATEMENT:   PERTINENT HISTORY:  Rt lumpectomy on 01/30/21 with 1/8 positive lymph node removed.  Completed radiation  04/15/21.  PAIN:  Are you having pain? No Breast pain intermittently - for no reason  PRECAUTIONS: Rt lymphedema risk  WEIGHT BEARING RESTRICTIONS No  FALLS:  Has patient fallen in last 6 months? 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[employer]   LEISURE: walking  HAND DOMINANCE : right   PRIOR LEVEL OF FUNCTION: Independent  PATIENT GOALS get swelling down all over   OBJECTIVE  Pt permission and consent throughout each step of examination and treatment with modification and draping if requested when working on sensitive areas   COGNITION:  Overall cognitive status: Within functional limits for  tasks assessed   PALPATION: Breast fibrosis moderate medial and inferior lateral breast  OBSERVATIONS / OTHER ASSESSMENTS: Rt arm larger in seated, enlarged pores entire breast and nipple enlarged due to edema  POSTURE: WFL  UPPER EXTREMITY AROM/PROM:  A/PROM RIGHT   eval   Shoulder extension   Shoulder flexion 155 - pull  Shoulder abduction 153 - pull  Shoulder internal rotation   Shoulder external rotation     (Blank rows = not tested)  A/PROM LEFT   eval  Shoulder extension   Shoulder flexion 165  Shoulder abduction 160  Shoulder internal rotation   Shoulder external rotation     (Blank rows = not tested)  LYMPHEDEMA ASSESSMENTS:  LANDMARK RIGHT  eval 11/28/21      15cm proximal to olecranon  41.5 39.5  10 cm proximal to olecranon process 39.5 37.6  Olecranon process 27.5 27.3  15 cm proximal to ulnar styloid 28.5 28.4  10 cm proximal to ulnar styloid process 24.5 25  Just proximal to ulnar styloid process 18.2 16.4  Across hand at thumb web space 18.2 18.6  At base of 2nd digit 7.0 6.5  (Blank rows = not tested)  LANDMARK LEFT  eval     15cm proximal to olecranon  40.2  10 cm proximal to olecranon process 38  Olecranon process 26.5  15 cm proximal to  ulnar styloid 26.5  10 cm proximal to ulnar styloid process 22.8  Just proximal to ulnar styloid process 15.7  Across hand at thumb web space 17.5  At base of 2nd digit 6.6  (Blank rows = not tested) Difference of  Breast edema questionnaire: 49/80: 61%  TODAY'S TREATMENT   11/28/21:  Manual Therapy Compression Bandaging: Began instructing pt in this today. Performed with her Elastomull to fingers 1-4 performing 2 revolutions at each finger to allow for greater ease for her with typing at work, and then 1-6 cm at hand with less revolutions so finished this on forearm, and then 1-12 cm spiral from wrist to axilla. Pt able to return therapist demo and handout issued. She will benefit from continued review/instruction of this. Then Cocoa butter applied to Rt UE, TG soft (so she could wash this when on vacation next week, Elastomull to fingers 1-4, (pt requested trying no artiflex) 1-6 cm short stretch to hand, then 1-10 and 1-12 to arm from wrist to axilla  Also showed pt nighttime garment options on [email or web address]. She is interested in pursuing this if she fits into one. She reports also having cancer insurance and will see if this will help cover as she hasn't met her deductible yet.  11/26/21:  Manual Therapy MLD: In supine: Short neck, 5 diaphragmatic breaths, Lt axillary nodes and establishment of anterior inter-axillary pathway, Rt inguinal nodes and establishment of Rt axillo-inguinal pathway,then Rt breast moving fluid towards pathways spending extra time in any areas of fibrosis then retracing all steps and included Rt UE as well today, working from proximal to distal then, into Lt S/L for further work to lateral aspect of breast and Rt axillo-inguinal nd posterior inter-axillary anastomosis, then finished in supine retracing all steps continuing to instruct pt throughout and offer hand over hand pressure for instruction in skin stretch, not slide. Pt was encouraged to cont  self MLD of the Rt breast while UE is bandaged.  Compression Bandaging: Cocoa butter applied to Rt UE, thin stockinette, Elastomull to fingers 1-4, Artiflex x1, 1-6 cm short stretch to haand, then 1-10  and 1-12 to arm from wrist to axilla beginning to explain sequence and technique while applying. Pt reports Rt UE feeling comfortable and was briefly instructed in remedial exercises.  11/22/21:  Education on pt having lymphedema at this point and that we need to start CDT and more consistent treatment to prevent the arm from getting larger.  Supine education on self MLD for the Rt breast with education on anatomy and physiology and reasons for technique In supine: Short neck, 5 diaphragmatic breaths, bil axillary nodes and establishment of interaxillary pathway, R inguinal nodes and establishment of axillo-inguinal pathway,then R breast moving fluid towards pathways spending extra time in any areas of fibrosis then retracing all steps. With vcs, tcs, and hand over hand cueing for performance Pt encouraged to wear compression bra   PATIENT EDUCATION:  Education details: per today's section Person educated: Patient Education method: Explanation, Demonstration, Tactile cues, Verbal cues, and Handouts Education comprehension: verbalized understanding, returned demonstration, verbal cues required, tactile cues required, and needs further education  HOME EXERCISE PROGRAM: Self MLD Rt breast  GARMENTS: sent demo to [facility] [employer] 11/22/21 for future garments which stated she has 80% coverage once the deductible has been met which is not yet met, has compression bra  ASSESSMENT:  CLINICAL IMPRESSION: Patient returns still wearing banadage from last session though this had slid down her arm a lot due to circumferential reductions. She requested les bandage at hand so attempted this today though did thoroughly instruct her that if she starts noticing increase swelling at her fingers and/or hand then we have to  go back to original way of bandaging and she was able to verbalize good understanding of this.  Instructed her in compression bandaging today by having pt return therapist demonstration. Also issued info on how to order elastomull from [email or web address] and printout issued. Pt will benefit from further review and hopes her husband can come to a future appt when they return from vacation. She also hopes to purchase a nighttime garment (left VM that she could try a Circaid Profile size III EW, but can return if it doesn't fit well.   OBJECTIVE IMPAIRMENTS decreased activity tolerance, decreased knowledge of condition, decreased knowledge of use of DME, decreased ROM, increased edema, and increased fascial restrictions.   ACTIVITY LIMITATIONS carrying, lifting, and reach over head  PARTICIPATION LIMITATIONS: community activity  PERSONAL FACTORS Time since onset of injury/illness/exacerbation and 1-2 comorbidities: lymph node removal and radiation  are also affecting patient's functional outcome.   REHAB POTENTIAL: Good  CLINICAL DECISION MAKING: Evolving/moderate complexity  EVALUATION COMPLEXITY: Moderate  GOALS: Goals reviewed with patient? Yes  SHORT TERM GOALS: Target date: 12/20/2021    Pt will be ind with self MLD for the Rt breast and arm  Baseline: Goal status: INITIAL  2.  Pt will be ind with self bandaging for the Rt UE with the help of her spouse as needed Baseline:  Goal status: INITIAL  LONG TERM GOALS: Target date: 01/17/2022    Pt will decrease volume by at least to demonstrate decreased risk of infection and fibrosis Baseline:  Goal status: INITIAL  2.  Pt will obtain appropriate compression for the arm and hand Baseline:  Goal status: INITIAL  3.  Pt will be ind with final HEP Baseline:  Goal status: INITIAL  PLAN: PT FREQUENCY: 3x/week  PT DURATION: 8 weeks  PLANNED INTERVENTIONS: Therapeutic exercises, Patient/Family education, Orthotic/Fit  training, Prosthetic training, DME instructions, Manual lymph drainage, scar mobilization, Taping, Vasopneumatic device, Manual  therapy, and Re-evaluation  PLAN FOR NEXT SESSION:  How did self bandaging go? Instruct husband in compression bandaging of Rt UE, Cont MLD to the Rt breast and UE, make foam, and shoulder ROM activities    Charvis Lightner, Julieanne Manson, PT 12/08/2021, 9:34 PM  Manual Lymph Drainage for Right Breast. Do daily. Do slowly. Use flat hands with just enough pressure to stretch the skin. Do not slide over the skin, stretch the skin with the hand. (Stretch ? Relax ? Move) Lie down or sit comfortably (in a recliner, for example) to do this. 1. Hug yourself: cross arms and do circles at collar bones near neck 5-7 times (to "wake up" lots  of lymph nodes in this area). 2. Take slow deep breaths, allowing your belly to balloon out as you breathe in, 5x (to "wake  up" abdominal lymph nodes). 3. Both armpits--stretch skin in small circles to stimulate intact lymph nodes there, 5-7x. 4. Right groin area, at panty line--stretch skin in small circles to stimulate lymph nodes 5-7x. 5. Redirect fluid from right chest toward left armpit (stretch skin starting at right chest in 3-4 spots  working toward left armpit) 3-4x across the chest. 6. Redirect fluid from right armpit toward right groin (cup your hand around the curve of your right side and do 3-4 "pumps" from armpit to groin) 3-4x down your side. 7. Draw an imaginary diagonal line from upper outer breast through the nipple area toward lower  inner breast. Direct fluid upward and inward from this line toward the pathway across your  upper chest (established in #5). Do this in three rows to treat all the upper inner breast and do  each row 3-4x. 8. Then direct the fluid down and out from this line toward the pathway down your side going  towards the left groin (established in #6). Do this in three rows and do each row 3-4x.  9. Then repeat your  pathways (#5 and #6) 10.End with repeating #3 and #4 above. (circles in both armpits and the right groin

## 2021-12-09 ENCOUNTER — Encounter: Payer: Self-pay | Admitting: Rehabilitation

## 2021-12-09 ENCOUNTER — Ambulatory Visit: Payer: BC Managed Care – PPO | Attending: Hematology | Admitting: Rehabilitation

## 2021-12-09 DIAGNOSIS — R293 Abnormal posture: Secondary | ICD-10-CM | POA: Insufficient documentation

## 2021-12-09 DIAGNOSIS — I890 Lymphedema, not elsewhere classified: Secondary | ICD-10-CM | POA: Diagnosis present

## 2021-12-09 DIAGNOSIS — Z170 Estrogen receptor positive status [ER+]: Secondary | ICD-10-CM | POA: Insufficient documentation

## 2021-12-09 DIAGNOSIS — C50411 Malignant neoplasm of upper-outer quadrant of right female breast: Secondary | ICD-10-CM | POA: Insufficient documentation

## 2021-12-09 DIAGNOSIS — M25611 Stiffness of right shoulder, not elsewhere classified: Secondary | ICD-10-CM | POA: Insufficient documentation

## 2021-12-09 DIAGNOSIS — Z483 Aftercare following surgery for neoplasm: Secondary | ICD-10-CM | POA: Insufficient documentation

## 2021-12-11 ENCOUNTER — Ambulatory Visit: Payer: BC Managed Care – PPO | Admitting: Physical Therapy

## 2021-12-13 ENCOUNTER — Ambulatory Visit: Payer: BC Managed Care – PPO | Admitting: Rehabilitation

## 2021-12-16 ENCOUNTER — Ambulatory Visit: Payer: BC Managed Care – PPO

## 2021-12-16 DIAGNOSIS — C50411 Malignant neoplasm of upper-outer quadrant of right female breast: Secondary | ICD-10-CM

## 2021-12-16 DIAGNOSIS — R293 Abnormal posture: Secondary | ICD-10-CM

## 2021-12-16 DIAGNOSIS — I890 Lymphedema, not elsewhere classified: Secondary | ICD-10-CM

## 2021-12-16 DIAGNOSIS — Z483 Aftercare following surgery for neoplasm: Secondary | ICD-10-CM

## 2021-12-16 DIAGNOSIS — M25611 Stiffness of right shoulder, not elsewhere classified: Secondary | ICD-10-CM

## 2021-12-16 NOTE — Therapy (Signed)
OUTPATIENT PHYSICAL THERAPY ONCOLOGY TREATMENT  Patient Name: Carla Patrick MRN: [id number] DOB:1966-01-31, 56 y.o., female Today's Date: 12/16/2021   PT End of Session - 12/16/21 1017     Visit Number 5    Number of Visits 25    Date for PT Re-Evaluation 02/14/22    PT Start Time 1015   pt arrived late   PT Stop Time 1057    PT Time Calculation (min) 42 min    Activity Tolerance Patient tolerated treatment well    Behavior During Therapy [facility] for tasks assessed/performed               Past Medical History:  Diagnosis Date   Abnormal EKG    Anemia    Anxiety    Asthma    Cancer ([place]) 11/2020   right breast [facility]   Depression    Heart murmur    History of radiation therapy    Right Breast- 03/18/21-04/15/21-Dr. Gery Pray   Hyperlipidemia    Hypertension    Past Surgical History:  Procedure Laterality Date   BREAST EXCISIONAL BIOPSY  2007   left   BREAST LUMPECTOMY WITH RADIOACTIVE SEED AND SENTINEL LYMPH NODE BIOPSY Right 01/30/2021   Procedure: RIGHT BREAST LUMPECTOMY WITH RADIOACTIVE SEED AND SENTINEL LYMPH NODE BIOPSY;  Surgeon: Jovita Kussmaul, MD;  Location: [facility];  Service: General;  Laterality: Right;   CHOLECYSTECTOMY  10/04/2011   Procedure: LAPAROSCOPIC CHOLECYSTECTOMY WITH INTRAOPERATIVE CHOLANGIOGRAM;  Surgeon: Rolm Bookbinder, MD;  Location: [facility];  Service: General;  Laterality: N/A;   Patient Active Problem List   Diagnosis Date Noted   Malignant neoplasm of upper-outer quadrant of right breast in female, estrogen receptor positive ([place]) 12/24/2020   Right knee pain 11/08/2013   Abnormal EKG 07/15/2013    PCP: Deland Pretty, MD  REFERRING PROVIDER: Dr. Truitt Merle  REFERRING DIAG: C50.411,Z17.0 (ICD-10-CM) - Malignant neoplasm of upper-outer quadrant of right breast in female, estrogen receptor positive ([place])  THERAPY DIAG:  Malignant neoplasm of upper-outer quadrant of right breast in female, estrogen receptor positive  ([place])  Abnormal posture  Aftercare following surgery for neoplasm  Stiffness of right shoulder, not elsewhere classified  Lymphedema, not elsewhere classified  ONSET DATE: 01/30/21  Rationale for Evaluation and Treatment Rehabilitation  SUBJECTIVE                                                                                                                                                                                           SUBJECTIVE STATEMENT: I last had it wrapped Friday evening and when I took it off Saturday morning my hand was  a little swollen so I don't think I wrapped it well. I'm working with the [employer] to get my day and night garments ordered once I can figure out their payment arrangements.   PERTINENT HISTORY:  Rt lumpectomy on 01/30/21 with 1/8 positive lymph node removed.  Completed radiation 04/15/21.  PAIN:  Are you having pain? No Breast pain intermittently - for no reason  PRECAUTIONS: Rt lymphedema risk  WEIGHT BEARING RESTRICTIONS No  FALLS:  Has patient fallen in last 6 months? 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[employer]   LEISURE: walking  HAND DOMINANCE : right   PRIOR LEVEL OF FUNCTION: Independent  PATIENT GOALS get swelling down all over   OBJECTIVE  Pt permission and consent throughout each step of examination and treatment with modification and draping if requested when working on sensitive areas   COGNITION:  Overall cognitive status: Within functional limits for tasks assessed   PALPATION: Breast fibrosis moderate medial and inferior lateral breast  OBSERVATIONS / OTHER ASSESSMENTS: Rt arm larger in seated, enlarged pores entire breast and nipple enlarged due to edema  POSTURE: WFL  UPPER EXTREMITY AROM/PROM:  A/PROM RIGHT   eval   Shoulder extension   Shoulder flexion 155 - pull  Shoulder abduction 153 - pull  Shoulder  internal rotation   Shoulder external rotation     (Blank rows = not tested)  A/PROM LEFT   eval  Shoulder extension   Shoulder flexion 165  Shoulder abduction 160  Shoulder internal rotation   Shoulder external rotation     (Blank rows = not tested)  LYMPHEDEMA ASSESSMENTS:  LANDMARK RIGHT  eval 11/28/21 12/09/21 12/16/21  Upper arm   40 40.9  15cm proximal to olecranon  41.5 39.5  39.9  10 cm proximal to olecranon process 39.5 37.6  37.5  Olecranon process 27.5 27.3 27.5 27.6  15 cm proximal to ulnar styloid 28.5 28.4  29.1  10 cm proximal to ulnar styloid process 24.5 25  25.2  Just proximal to ulnar styloid process 18.2 16.4 16.4 16.8  Across hand at thumb web space 18.2 18.6  19.4  At base of 2nd digit 7.0 6.5  6.8  (Blank rows = not tested) 2885m  LANDMARK LEFT  eval     15cm proximal to olecranon  40.2  10 cm proximal to olecranon process 38  Olecranon process 26.5  15 cm proximal to ulnar styloid 26.5  10 cm proximal to ulnar styloid process 22.8  Just proximal to ulnar styloid process 15.7  Across hand at thumb web space 17.5  At base of 2nd digit 6.6  (Blank rows = not tested) 25471mDifference of 28027mBreast edema questionnaire: 49/80: 61%  TODAY'S TREATMENT  12/16/21: Manual Therapy MLD: In supine: Short neck, 5 diaphragmatic breaths, Lt axillary nodes and establishment of anterior inter-axillary pathway, Rt inguinal nodes and establishment of Rt axillo-inguinal pathway,then Rt UE working from proximal to distal then finished retracing all steps Compression Bandaging: Elastomull to fingers 1-4 performing 2 revolutions at each finger to allow for greater ease for her with typing at work, and then 1-6 cm at hand with less revolutions so finished this on forearm (reviewed this technique with pt while applying as she reports having hand swelling last time she removed this bandage and not sure why), and then 1-10 cm and 1-12cm spiral from wrist to axilla.    12/09/21 Manual  Therapy Compression Bandaging: Elastomull to fingers 1-4 performing 2 revolutions at each finger to allow for greater ease for her with typing at work, and then 1-6 cm at hand with less revolutions so finished this on forearm, and then 1-10 cm and 1-12cm spiral from wrist to axilla.  Measured pt for a compression sleeve.  Ended up on a bella strong sleeve and glove as pt does not fit in a ready to wear flat knit and is not insterested in custom at this time due to no coverage before the deductible is met.    11/28/21:  Manual Therapy Compression Bandaging: Began instructing pt in this today. Performed with her Elastomull to fingers 1-4 performing 2 revolutions at each finger to allow for greater ease for her with typing at work, and then 1-6 cm at hand with less revolutions so finished this on forearm, and then 1-12 cm spiral from wrist to axilla. Pt able to return therapist demo and handout issued. She will benefit from continued review/instruction of this. Then Cocoa butter applied to Rt UE, TG soft (so she could wash this when on vacation next week, Elastomull to fingers 1-4, (pt requested trying no artiflex) 1-6 cm short stretch to hand, then 1-10 and 1-12 to arm from wrist to axilla  Also showed pt nighttime garment options on [email or web address]. She is interested in pursuing this if she fits into one. She reports also having cancer insurance and will see if this will help cover as she hasn't met her deductible yet.    PATIENT EDUCATION:  Education details: per today's section Person educated: Patient Education method: Explanation, Demonstration, Tactile cues, Verbal cues, and Handouts Education comprehension: verbalized understanding, returned demonstration, verbal cues required, tactile cues required, and needs further education  HOME EXERCISE PROGRAM: Self MLD Rt breast  GARMENTS: sent demo to [employer] 11/22/21 for future garments which stated she has 80% coverage once  the deductible has been met which is not yet met, has compression bra, sent over potential order for bella strong sleeve and glove 12/09/21  ASSESSMENT:  CLINICAL IMPRESSION: Pt is maintaining  OBJECTIVE IMPAIRMENTS decreased activity tolerance, decreased knowledge of condition, decreased knowledge of use of DME, decreased ROM, increased edema, and increased fascial restrictions.   ACTIVITY LIMITATIONS carrying, lifting, and reach over head  PARTICIPATION LIMITATIONS: community activity  PERSONAL FACTORS Time since onset of injury/illness/exacerbation and 1-2 comorbidities: lymph node removal and radiation  are also affecting patient's functional outcome.   REHAB POTENTIAL: Good  CLINICAL DECISION MAKING: Evolving/moderate complexity  EVALUATION COMPLEXITY: Moderate  GOALS: Goals reviewed with patient? Yes  SHORT TERM GOALS: Target date: 12/20/2021    Pt will be ind with self MLD for the Rt breast and arm  Baseline: Goal status: INITIAL  2.  Pt will be ind with self bandaging for the Rt UE with the help of her spouse as needed Baseline:  Goal status: INITIAL  LONG TERM GOALS: Target date: 01/17/2022    Pt will decrease volume by at least 125m to demonstrate decreased risk of infection and fibrosis Baseline:  Goal status: INITIAL  2.  Pt will obtain appropriate compression for the arm and hand Baseline:  Goal status: INITIAL  3.  Pt will be ind with final HEP Baseline:  Goal status: INITIAL  PLAN: PT FREQUENCY: 3x/week  PT DURATION: 8 weeks  PLANNED INTERVENTIONS: Therapeutic exercises, Patient/Family education, Orthotic/Fit training, Prosthetic training, DME instructions, Manual lymph drainage, scar mobilization, Taping, Vasopneumatic device, Manual therapy, and Re-evaluation  PLAN FOR  NEXT SESSION:  Get garments ordered? Cont MLD to the Rt breast and UE, make foam, and shoulder ROM activities    Otelia Limes, PTA 12/16/2021, 12:37 PM  Manual Lymph  Drainage for Right Breast. Do daily. Do slowly. Use flat hands with just enough pressure to stretch the skin. Do not slide over the skin, stretch the skin with the hand. (Stretch ? Relax ? Move) Lie down or sit comfortably (in a recliner, for example) to do this. 1. Hug yourself: cross arms and do circles at collar bones near neck 5-7 times (to "wake up" lots  of lymph nodes in this area). 2. Take slow deep breaths, allowing your belly to balloon out as you breathe in, 5x (to "wake  up" abdominal lymph nodes). 3. Both armpits--stretch skin in small circles to stimulate intact lymph nodes there, 5-7x. 4. Right groin area, at panty line--stretch skin in small circles to stimulate lymph nodes 5-7x. 5. Redirect fluid from right chest toward left armpit (stretch skin starting at right chest in 3-4 spots  working toward left armpit) 3-4x across the chest. 6. Redirect fluid from right armpit toward right groin (cup your hand around the curve of your right side and do 3-4 "pumps" from armpit to groin) 3-4x down your side. 7. Draw an imaginary diagonal line from upper outer breast through the nipple area toward lower  inner breast. Direct fluid upward and inward from this line toward the pathway across your  upper chest (established in #5). Do this in three rows to treat all the upper inner breast and do  each row 3-4x. 8. Then direct the fluid down and out from this line toward the pathway down your side going  towards the left groin (established in #6). Do this in three rows and do each row 3-4x.  9. Then repeat your pathways (#5 and #6) 10.End with repeating #3 and #4 above. (circles in both armpits and the right groin

## 2021-12-18 ENCOUNTER — Ambulatory Visit: Payer: BC Managed Care – PPO | Admitting: Rehabilitation

## 2021-12-20 ENCOUNTER — Ambulatory Visit: Payer: BC Managed Care – PPO

## 2021-12-20 DIAGNOSIS — Z483 Aftercare following surgery for neoplasm: Secondary | ICD-10-CM

## 2021-12-20 DIAGNOSIS — C50411 Malignant neoplasm of upper-outer quadrant of right female breast: Secondary | ICD-10-CM | POA: Diagnosis not present

## 2021-12-20 DIAGNOSIS — M25611 Stiffness of right shoulder, not elsewhere classified: Secondary | ICD-10-CM

## 2021-12-20 DIAGNOSIS — R293 Abnormal posture: Secondary | ICD-10-CM

## 2021-12-20 DIAGNOSIS — Z170 Estrogen receptor positive status [ER+]: Secondary | ICD-10-CM

## 2021-12-20 DIAGNOSIS — I890 Lymphedema, not elsewhere classified: Secondary | ICD-10-CM

## 2021-12-20 NOTE — Therapy (Signed)
OUTPATIENT PHYSICAL THERAPY ONCOLOGY TREATMENT  Patient Name: Carla Patrick MRN: [id number] DOB:11-08-1965, 56 y.o., female Today's Date: 12/20/2021   PT End of Session - 12/20/21 0933     Visit Number 6    Number of Visits 25    Date for PT Re-Evaluation 02/14/22    PT Start Time 0921   pt arrived late   PT Stop Time 1023    PT Time Calculation (min) 62 min    Activity Tolerance Patient tolerated treatment well    Behavior During Therapy [facility] for tasks assessed/performed               Past Medical History:  Diagnosis Date   Abnormal EKG    Anemia    Anxiety    Asthma    Cancer ([place]) 11/2020   right breast [facility]   Depression    Heart murmur    History of radiation therapy    Right Breast- 03/18/21-04/15/21-Dr. Gery Pray   Hyperlipidemia    Hypertension    Past Surgical History:  Procedure Laterality Date   BREAST EXCISIONAL BIOPSY  2007   left   BREAST LUMPECTOMY WITH RADIOACTIVE SEED AND SENTINEL LYMPH NODE BIOPSY Right 01/30/2021   Procedure: RIGHT BREAST LUMPECTOMY WITH RADIOACTIVE SEED AND SENTINEL LYMPH NODE BIOPSY;  Surgeon: Jovita Kussmaul, MD;  Location: [facility];  Service: General;  Laterality: Right;   CHOLECYSTECTOMY  10/04/2011   Procedure: LAPAROSCOPIC CHOLECYSTECTOMY WITH INTRAOPERATIVE CHOLANGIOGRAM;  Surgeon: Rolm Bookbinder, MD;  [facility];  Service: General;  Laterality: N/A;   Patient Active Problem List   Diagnosis Date Noted   Malignant neoplasm of upper-outer quadrant of right breast in female, estrogen receptor positive ([place]) 12/24/2020   Right knee pain 11/08/2013   Abnormal EKG 07/15/2013    PCP: Deland Pretty, MD  REFERRING PROVIDER: Dr. Truitt Merle  REFERRING DIAG: C50.411,Z17.0 (ICD-10-CM) - Malignant neoplasm of upper-outer quadrant of right breast in female, estrogen receptor positive ([place])  THERAPY DIAG:  Malignant neoplasm of upper-outer quadrant of right breast in female, estrogen receptor positive  ([place])  Abnormal posture  Aftercare following surgery for neoplasm  Stiffness of right shoulder, not elsewhere classified  Lymphedema, not elsewhere classified  ONSET DATE: 01/30/21  Rationale for Evaluation and Treatment Rehabilitation  SUBJECTIVE                                                                                                                                                                                           SUBJECTIVE STATEMENT: I find myself reaching with my Rt arm now where as before I would just use my Lt.  And I corrected how I was bandaging my fingers from the review you gave me last time and the swelling in my fingers is  much improved now!  PERTINENT HISTORY:  Rt lumpectomy on 01/30/21 with 1/8 positive lymph node removed.  Completed radiation 04/15/21.  PAIN:  Are you having pain? No Breast pain intermittently - for no reason  PRECAUTIONS: Rt lymphedema risk  WEIGHT BEARING RESTRICTIONS No  FALLS:  Has patient fallen in last 6 months? 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[employer]   LEISURE: walking  HAND DOMINANCE : right   PRIOR LEVEL OF FUNCTION: Independent  PATIENT GOALS get swelling down all over   OBJECTIVE  Pt permission and consent throughout each step of examination and treatment with modification and draping if requested when working on sensitive areas   COGNITION:  Overall cognitive status: Within functional limits for tasks assessed   PALPATION: Breast fibrosis moderate medial and inferior lateral breast  OBSERVATIONS / OTHER ASSESSMENTS: Rt arm larger in seated, enlarged pores entire breast and nipple enlarged due to edema  POSTURE: WFL  UPPER EXTREMITY AROM/PROM:  A/PROM RIGHT   eval   Shoulder extension   Shoulder flexion 155 - pull  Shoulder abduction 153 - pull  Shoulder internal rotation   Shoulder external rotation      (Blank rows = not tested)  A/PROM LEFT   eval  Shoulder extension   Shoulder flexion 165  Shoulder abduction 160  Shoulder internal rotation   Shoulder external rotation     (Blank rows = not tested)  LYMPHEDEMA ASSESSMENTS:  LANDMARK RIGHT  eval 11/28/21 12/09/21 12/16/21  Upper arm   40 40.9  15cm proximal to olecranon  41.5 39.5  39.9  10 cm proximal to olecranon process 39.5 37.6  37.5  Olecranon process 27.5 27.3 27.5 27.6  15 cm proximal to ulnar styloid 28.5 28.4  29.1  10 cm proximal to ulnar styloid process 24.5 25  25.2  Just proximal to ulnar styloid process 18.2 16.4 16.4 16.8  Across hand at thumb web space 18.2 18.6  19.4  At base of 2nd digit 7.0 6.5  6.8  (Blank rows = not tested) 2824m  LANDMARK LEFT  eval     15cm proximal to olecranon  40.2  10 cm proximal to olecranon process 38  Olecranon process 26.5  15 cm proximal to ulnar styloid 26.5  10 cm proximal to ulnar styloid process 22.8  Just proximal to ulnar styloid process 15.7  Across hand at thumb web space 17.5  At base of 2nd digit 6.6  (Blank rows = not tested) 25449mDifference of 28057mBreast edema questionnaire: 49/80: 61%  TODAY'S TREATMENT  12/20/21: Therapeutic Exercises Pulleys into flexion and abduction x2 mins each returning therapist demo and VCs and demo throughout to decrease Rt scapular compensation Roll ball up wall into flexion x10 each returning therapist demo  Manual Therapy MLD: In supine: Short neck, 5 diaphragmatic breaths, Lt axillary nodes and establishment of anterior inter-axillary pathway, Rt inguinal nodes and establishment of Rt axillo-inguinal pathway,then Rt breast and UE working from proximal to distal then finished retracing all steps P/ROM to Rt shoulder during MLD into flexion, abd and D2 to pts tolerance  12/16/21: Manual Therapy MLD: In supine: Short neck, 5 diaphragmatic breaths, Lt axillary nodes and establishment of anterior inter-axillary pathway,  Rt inguinal nodes and establishment of Rt axillo-inguinal pathway,then  Rt UE working from proximal to distal then finished retracing all steps Compression Bandaging: Elastomull to fingers 1-4 performing 2 revolutions at each finger to allow for greater ease for her with typing at work, and then 1-6 cm at hand with less revolutions so finished this on forearm (reviewed this technique with pt while applying as she reports having hand swelling last time she removed this bandage and not suTre why), and then 1-10 cm and 1-12cm spiral from wrist to axilla.   12/09/21 Manual Therapy Compression Bandaging: Elastomull to fingers 1-4 performing 2 revolutions at each finger to allow for greater ease for her with typing at work, and then 1-6 cm at hand with less revolutions so finished this on forearm, and then 1-10 cm and 1-12cm spiral from wrist to axilla.  Measured pt for a compression sleeve.  Ended up on a bella strong sleeve and glove as pt does not fit in a ready to wear flat knit and is not interested in custom at this time due to no coverage before the deductible is met.     PATIENT EDUCATION:  Education details: per today's section Person educated: Patient Education method: Explanation, Demonstration, Tactile cues, Verbal cues, and Handouts Education comprehension: verbalized understanding, returned demonstration, verbal cues required, tactile cues required, and needs further education  HOME EXERCISE PROGRAM: Self MLD Rt breast  GARMENTS: sent demo to [place] 11/22/21 for future garments which stated she has 80% coverage once the deductible has been met which is not yet met, has compression bra, sent over potential order for bella strong sleeve and glove 12/09/21  ASSESSMENT:  CLINICAL IMPRESSION: Pt reports noticing her Rt shoulder A/ROM is improving and overall breast is feeling better. She also reports finger swelling improved after changing her finger bandaging technique after further  instruction at last session. She is waiting to hear back from compression [place] about pricing. [employer] called yesterday and she is going to hold off for now due to pricing and to focus on getting compression garments first.   OBJECTIVE IMPAIRMENTS decreased activity tolerance, decreased knowledge of condition, decreased knowledge of use of DME, decreased ROM, increased edema, and increased fascial restrictions.   ACTIVITY LIMITATIONS carrying, lifting, and reach over head  PARTICIPATION LIMITATIONS: community activity  PERSONAL FACTORS Time since onset of injury/illness/exacerbation and 1-2 comorbidities: lymph node removal and radiation  are also affecting patient's functional outcome.   REHAB POTENTIAL: Good  CLINICAL DECISION MAKING: Evolving/moderate complexity  EVALUATION COMPLEXITY: Moderate  GOALS: Goals reviewed with patient? Yes  SHORT TERM GOALS: Target date: 12/20/2021    Pt will be ind with self MLD for the Rt breast and arm  Baseline: Goal status: INITIAL  2.  Pt will be ind with self bandaging for the Rt UE with the help of her spouse as needed Baseline:  Goal status: INITIAL  LONG TERM GOALS: Target date: 01/17/2022    Pt will decrease volume by at least 178m to demonstrate decreased risk of infection and fibrosis Baseline:  Goal status: INITIAL  2.  Pt will obtain appropriate compression for the arm and hand Baseline:  Goal status: INITIAL  3.  Pt will be ind with final HEP Baseline:  Goal status: INITIAL  PLAN: PT FREQUENCY: 3x/week  PT DURATION: 8 weeks  PLANNED INTERVENTIONS: Therapeutic exercises, Patient/Family education, Orthotic/Fit training, Prosthetic training, DME instructions, Manual lymph drainage, scar mobilization, Taping, Vasopneumatic device, Manual therapy, and Re-evaluation  PLAN FOR NEXT SESSION:  Speak with garment company/get garments ordered? Cont  MLD to the Rt breast and UE, make foam, and shoulder ROM activities     Otelia Limes, PTA 12/20/2021, 10:24 AM  Manual Lymph Drainage for Right Breast. Do daily. Do slowly. Use flat hands with just enough pressure to stretch the skin. Do not slide over the skin, stretch the skin with the hand. (Stretch ? Relax ? Move) Lie down or sit comfortably (in a recliner, for example) to do this. 1. Hug yourself: cross arms and do circles at collar bones near neck 5-7 times (to "wake up" lots  of lymph nodes in this area). 2. Take slow deep breaths, allowing your belly to balloon out as you breathe in, 5x (to "wake  up" abdominal lymph nodes). 3. Both armpits--stretch skin in small circles to stimulate intact lymph nodes there, 5-7x. 4. Right groin area, at panty line--stretch skin in small circles to stimulate lymph nodes 5-7x. 5. Redirect fluid from right chest toward left armpit (stretch skin starting at right chest in 3-4 spots  working toward left armpit) 3-4x across the chest. 6. Redirect fluid from right armpit toward right groin (cup your hand around the curve of your right side and do 3-4 "pumps" from armpit to groin) 3-4x down your side. 7. Draw an imaginary diagonal line from upper outer breast through the nipple area toward lower  inner breast. Direct fluid upward and inward from this line toward the pathway across your  upper chest (established in #5). Do this in three rows to treat all the upper inner breast and do  each row 3-4x. 8. Then direct the fluid down and out from this line toward the pathway down your side going  towards the left groin (established in #6). Do this in three rows and do each row 3-4x.  9. Then repeat your pathways (#5 and #6) 10.End with repeating #3 and #4 above. (circles in both armpits and the right groin

## 2021-12-23 ENCOUNTER — Ambulatory Visit: Payer: BC Managed Care – PPO | Admitting: Physical Therapy

## 2021-12-23 ENCOUNTER — Encounter: Payer: Self-pay | Admitting: Physical Therapy

## 2021-12-23 DIAGNOSIS — C50411 Malignant neoplasm of upper-outer quadrant of right female breast: Secondary | ICD-10-CM | POA: Diagnosis not present

## 2021-12-23 DIAGNOSIS — Z483 Aftercare following surgery for neoplasm: Secondary | ICD-10-CM

## 2021-12-23 DIAGNOSIS — Z170 Estrogen receptor positive status [ER+]: Secondary | ICD-10-CM

## 2021-12-23 DIAGNOSIS — R293 Abnormal posture: Secondary | ICD-10-CM

## 2021-12-23 DIAGNOSIS — I890 Lymphedema, not elsewhere classified: Secondary | ICD-10-CM

## 2021-12-23 DIAGNOSIS — M25611 Stiffness of right shoulder, not elsewhere classified: Secondary | ICD-10-CM

## 2021-12-23 NOTE — Therapy (Signed)
OUTPATIENT PHYSICAL THERAPY ONCOLOGY TREATMENT  Patient Name: Carla Patrick MRN: [id number] DOB:September 23, 1965, 56 y.o., female Today's Date: 12/23/2021   PT End of Session - 12/23/21 0927     Visit Number 7    Number of Visits 25    Date for PT Re-Evaluation 02/14/22    PT Start Time 0927   pt arrived late   PT Stop Time 0957    PT Time Calculation (min) 30 min    Activity Tolerance Patient tolerated treatment well    Behavior During Therapy [facility] for tasks assessed/performed               Past Medical History:  Diagnosis Date   Abnormal EKG    Anemia    Anxiety    Asthma    Cancer ([place]) 11/2020   right breast [facility]   Depression    Heart murmur    History of radiation therapy    Right Breast- 03/18/21-04/15/21-Dr. Gery Pray   Hyperlipidemia    Hypertension    Past Surgical History:  Procedure Laterality Date   BREAST EXCISIONAL BIOPSY  2007   left   BREAST LUMPECTOMY WITH RADIOACTIVE SEED AND SENTINEL LYMPH NODE BIOPSY Right 01/30/2021   Procedure: RIGHT BREAST LUMPECTOMY WITH RADIOACTIVE SEED AND SENTINEL LYMPH NODE BIOPSY;  Surgeon: Jovita Kussmaul, MD;  Location: [place];  Service: General;  Laterality: Right;   CHOLECYSTECTOMY  10/04/2011   Procedure: LAPAROSCOPIC CHOLECYSTECTOMY WITH INTRAOPERATIVE CHOLANGIOGRAM;  Surgeon: Rolm Bookbinder, MD;  Location: [facility];  Service: General;  Laterality: N/A;   Patient Active Problem List   Diagnosis Date Noted   Malignant neoplasm of upper-outer quadrant of right breast in female, estrogen receptor positive ([name]) 12/24/2020   Right knee pain 11/08/2013   Abnormal EKG 07/15/2013    PCP: Deland Pretty, MD  REFERRING PROVIDER: Dr. Truitt Merle  REFERRING DIAG: C50.411,Z17.0 (ICD-10-CM) - Malignant neoplasm of upper-outer quadrant of right breast in female, estrogen receptor positive ([name])  THERAPY DIAG:  Lymphedema, not elsewhere classified  Stiffness of right shoulder, not elsewhere  classified  Aftercare following surgery for neoplasm  Abnormal posture  Malignant neoplasm of upper-outer quadrant of right breast in female, estrogen receptor positive ([name])  ONSET DATE: 01/30/21  Rationale for Evaluation and Treatment Rehabilitation  SUBJECTIVE                                                                                                                                                                                           SUBJECTIVE STATEMENT: I think the breast swelling is going down.   PERTINENT HISTORY:  Rt lumpectomy on 01/30/21 with  1/8 positive lymph node removed.  Completed radiation 04/15/21.  PAIN:  Are you having pain? No Breast pain intermittently - for no reason  PRECAUTIONS: Rt lymphedema risk  WEIGHT BEARING RESTRICTIONS No  FALLS:  Has patient fallen in last 6 months? 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[employer]   LEISURE: walking  HAND DOMINANCE : right   PRIOR LEVEL OF FUNCTION: Independent  PATIENT GOALS get swelling down all over   OBJECTIVE  Pt permission and consent throughout each step of examination and treatment with modification and draping if requested when working on sensitive areas   COGNITION:  Overall cognitive status: Within functional limits for tasks assessed   PALPATION: Breast fibrosis moderate medial and inferior lateral breast  OBSERVATIONS / OTHER ASSESSMENTS: Rt arm larger in seated, enlarged pores entire breast and nipple enlarged due to edema  POSTURE: WFL  UPPER EXTREMITY AROM/PROM:  A/PROM RIGHT   eval   Shoulder extension   Shoulder flexion 155 - pull  Shoulder abduction 153 - pull  Shoulder internal rotation   Shoulder external rotation     (Blank rows = not tested)  A/PROM LEFT   eval  Shoulder extension   Shoulder flexion 165  Shoulder abduction 160  Shoulder internal rotation   Shoulder  external rotation     (Blank rows = not tested)  LYMPHEDEMA ASSESSMENTS:  LANDMARK RIGHT  eval 11/28/21 12/09/21 12/16/21  Upper arm   40 40.9  15cm proximal to olecranon  41.5 39.5  39.9  10 cm proximal to olecranon process 39.5 37.6  37.5  Olecranon process 27.5 27.3 27.5 27.6  15 cm proximal to ulnar styloid 28.5 28.4  29.1  10 cm proximal to ulnar styloid process 24.5 25  25.2  Just proximal to ulnar styloid process 18.2 16.4 16.4 16.8  Across hand at thumb web space 18.2 18.6  19.4  At base of 2nd digit 7.0 6.5  6.8  (Blank rows = not tested) 2835m  LANDMARK LEFT  eval     15cm proximal to olecranon  40.2  10 cm proximal to olecranon process 38  Olecranon process 26.5  15 cm proximal to ulnar styloid 26.5  10 cm proximal to ulnar styloid process 22.8  Just proximal to ulnar styloid process 15.7  Across hand at thumb web space 17.5  At base of 2nd digit 6.6  (Blank rows = not tested) 25464mDifference of 28053mBreast edema questionnaire: 49/80: 61%  TODAY'S TREATMENT   12/23/21: Manual Therapy MLD: In supine: Short neck, 5 diaphragmatic breaths, Lt axillary nodes and establishment of anterior inter-axillary pathway, Rt inguinal nodes and establishment of Rt axillo-inguinal pathway,then Rt breast and UE working from proximal to distal then finished retracing all steps  12/20/21: Therapeutic Exercises Pulleys into flexion and abduction x2 mins each returning therapist demo and VCs and demo throughout to decrease Rt scapular compensation Roll ball up wall into flexion x10 each returning therapist demo  Manual Therapy MLD: In supine: Short neck, 5 diaphragmatic breaths, Lt axillary nodes and establishment of anterior inter-axillary pathway, Rt inguinal nodes and establishment of Rt axillo-inguinal pathway,then Rt breast and UE working from proximal to distal then finished retracing all steps P/ROM to Rt shoulder during MLD into flexion, abd and D2 to pts  tolerance  12/16/21: Manual Therapy MLD: In supine: Short neck, 5 diaphragmatic breaths, Lt axillary nodes and establishment of anterior inter-axillary pathway, Rt  inguinal nodes and establishment of Rt axillo-inguinal pathway,then Rt UE working from proximal to distal then finished retracing all steps Compression Bandaging: Elastomull to fingers 1-4 performing 2 revolutions at each finger to allow for greater ease for her with typing at work, and then 1-6 cm at hand with less revolutions so finished this on forearm (reviewed this technique with pt while applying as she reports having hand swelling last time she removed this bandage and not suTre why), and then 1-10 cm and 1-12cm spiral from wrist to axilla.   12/09/21 Manual Therapy Compression Bandaging: Elastomull to fingers 1-4 performing 2 revolutions at each finger to allow for greater ease for her with typing at work, and then 1-6 cm at hand with less revolutions so finished this on forearm, and then 1-10 cm and 1-12cm spiral from wrist to axilla.  Measured pt for a compression sleeve.  Ended up on a bella strong sleeve and glove as pt does not fit in a ready to wear flat knit and is not interested in custom at this time due to no coverage before the deductible is met.     PATIENT EDUCATION:  Education details: per today's section Person educated: Patient Education method: Explanation, Demonstration, Tactile cues, Verbal cues, and Handouts Education comprehension: verbalized understanding, returned demonstration, verbal cues required, tactile cues required, and needs further education  HOME EXERCISE PROGRAM: Self MLD Rt breast  GARMENTS: sent demo to [employer] 11/22/21 for future garments which stated she has 80% coverage once the deductible has been met which is not yet met, has compression bra, sent over potential order for bella strong sleeve and glove 12/09/21  ASSESSMENT:  CLINICAL IMPRESSION: Pt arrived late to her appointment  today so spent session focusing on MLD to R breast. Pt will be flying this weekend so educated pt to wear her compression bra and to bandage her RUE to avoid any increase in edema. Her fibrosis in inferior breast is softening.   OBJECTIVE IMPAIRMENTS decreased activity tolerance, decreased knowledge of condition, decreased knowledge of use of DME, decreased ROM, increased edema, and increased fascial restrictions.   ACTIVITY LIMITATIONS carrying, lifting, and reach over head  PARTICIPATION LIMITATIONS: community activity  PERSONAL FACTORS Time since onset of injury/illness/exacerbation and 1-2 comorbidities: lymph node removal and radiation  are also affecting patient's functional outcome.   REHAB POTENTIAL: Good  CLINICAL DECISION MAKING: Evolving/moderate complexity  EVALUATION COMPLEXITY: Moderate  GOALS: Goals reviewed with patient? Yes  SHORT TERM GOALS: Target date: 12/20/2021    Pt will be ind with self MLD for the Rt breast and arm  Baseline: Goal status: INITIAL  2.  Pt will be ind with self bandaging for the Rt UE with the help of her spouse as needed Baseline:  Goal status: INITIAL  LONG TERM GOALS: Target date: 01/17/2022    Pt will decrease volume by at least 159ml to demonstrate decreased risk of infection and fibrosis Baseline:  Goal status: INITIAL  2.  Pt will obtain appropriate compression for the arm and hand Baseline:  Goal status: INITIAL  3.  Pt will be ind with final HEP Baseline:  Goal status: INITIAL  PLAN: PT FREQUENCY: 3x/week  PT DURATION: 8 weeks  PLANNED INTERVENTIONS: Therapeutic exercises, Patient/Family education, Orthotic/Fit training, Prosthetic training, DME instructions, Manual lymph drainage, scar mobilization, Taping, Vasopneumatic device, Manual therapy, and Re-evaluation  PLAN FOR NEXT SESSION:  Speak with garment company/get garments ordered? Cont MLD to the Rt breast and UE, make foam, and shoulder ROM  activities    [employer], [place] 12/23/2021, 10:01 AM  Manual Lymph Drainage for Right Breast. Do daily. Do slowly. Use flat hands with just enough pressure to stretch the skin. Do not slide over the skin, stretch the skin with the hand. (Stretch ? Relax ? Move) Lie down or sit comfortably (in a recliner, for example) to do this. 1. Hug yourself: cross arms and do circles at collar bones near neck 5-7 times (to "wake up" lots  of lymph nodes in this area). 2. Take slow deep breaths, allowing your belly to balloon out as you breathe in, 5x (to "wake  up" abdominal lymph nodes). 3. Both armpits--stretch skin in small circles to stimulate intact lymph nodes there, 5-7x. 4. Right groin area, at panty line--stretch skin in small circles to stimulate lymph nodes 5-7x. 5. Redirect fluid from right chest toward left armpit (stretch skin starting at right chest in 3-4 spots  working toward left armpit) 3-4x across the chest. 6. Redirect fluid from right armpit toward right groin (cup your hand around the curve of your right side and do 3-4 "pumps" from armpit to groin) 3-4x down your side. 7. Draw an imaginary diagonal line from upper outer breast through the nipple area toward lower  inner breast. Direct fluid upward and inward from this line toward the pathway across your  upper chest (established in #5). Do this in three rows to treat all the upper inner breast and do  each row 3-4x. 8. Then direct the fluid down and out from this line toward the pathway down your side going  towards the left groin (established in #6). Do this in three rows and do each row 3-4x.  9. Then repeat your pathways (#5 and #6) 10.End with repeating #3 and #4 above. (circles in both armpits and the right groin

## 2021-12-25 ENCOUNTER — Ambulatory Visit: Payer: BC Managed Care – PPO | Admitting: Rehabilitation

## 2021-12-25 ENCOUNTER — Encounter: Payer: Self-pay | Admitting: Rehabilitation

## 2021-12-25 DIAGNOSIS — Z170 Estrogen receptor positive status [ER+]: Secondary | ICD-10-CM

## 2021-12-25 DIAGNOSIS — C50411 Malignant neoplasm of upper-outer quadrant of right female breast: Secondary | ICD-10-CM | POA: Diagnosis not present

## 2021-12-25 DIAGNOSIS — R293 Abnormal posture: Secondary | ICD-10-CM

## 2021-12-25 DIAGNOSIS — Z483 Aftercare following surgery for neoplasm: Secondary | ICD-10-CM

## 2021-12-25 DIAGNOSIS — I890 Lymphedema, not elsewhere classified: Secondary | ICD-10-CM

## 2021-12-25 DIAGNOSIS — M25611 Stiffness of right shoulder, not elsewhere classified: Secondary | ICD-10-CM

## 2021-12-25 NOTE — Therapy (Signed)
OUTPATIENT PHYSICAL THERAPY ONCOLOGY TREATMENT  Patient Name: Carla Patrick MRN: [id number] DOB:06-13-1965, 56 y.o., female Today's Date: 12/25/2021   PT End of Session - 12/25/21 0904     Visit Number 8    Number of Visits 25    Date for PT Re-Evaluation 02/14/22    PT Start Time 0905    PT Stop Time 0953    PT Time Calculation (min) 48 min    Activity Tolerance Patient tolerated treatment well    Behavior During Therapy [facility] for tasks assessed/performed               Past Medical History:  Diagnosis Date   Abnormal EKG    Anemia    Anxiety    Asthma    Cancer ([facility]) 11/2020   right breast [facility]   Depression    Heart murmur    History of radiation therapy    Right Breast- 03/18/21-04/15/21-Dr. Antony Blackbird   Hyperlipidemia    Hypertension    Past Surgical History:  Procedure Laterality Date   BREAST EXCISIONAL BIOPSY  2007   left   BREAST LUMPECTOMY WITH RADIOACTIVE SEED AND SENTINEL LYMPH NODE BIOPSY Right 01/30/2021   Procedure: RIGHT BREAST LUMPECTOMY WITH RADIOACTIVE SEED AND SENTINEL LYMPH NODE BIOPSY;  Surgeon: Griselda Miner, MD;  Location: [facility];  Service: General;  Laterality: Right;   CHOLECYSTECTOMY  10/04/2011   Procedure: LAPAROSCOPIC CHOLECYSTECTOMY WITH INTRAOPERATIVE CHOLANGIOGRAM;  Surgeon: Emelia Loron, MD;  Location: [facility];  Service: General;  Laterality: N/A;   Patient Active Problem List   Diagnosis Date Noted   Malignant neoplasm of upper-outer quadrant of right breast in female, estrogen receptor positive ([facility]) 12/24/2020   Right knee pain 11/08/2013   Abnormal EKG 07/15/2013    PCP: Merri Brunette, MD  REFERRING PROVIDER: Dr. Malachy Mood  REFERRING DIAG: C50.411,Z17.0 (ICD-10-CM) - Malignant neoplasm of upper-outer quadrant of right breast in female, estrogen receptor positive ([facility])  THERAPY DIAG:  Lymphedema, not elsewhere classified  Stiffness of right shoulder, not elsewhere classified  Aftercare  following surgery for neoplasm  Abnormal posture  Malignant neoplasm of upper-outer quadrant of right breast in female, estrogen receptor positive ([facility])  ONSET DATE: 01/30/21  Rationale for Evaluation and Treatment Rehabilitation  SUBJECTIVE                                                                                                                                                                                           SUBJECTIVE STATEMENT: I haven't wrapped it in the last day or 2.  I spoke with Lanora Manis yesterday to sign for garments and they  are waiting on something from Dr. Mosetta Putt.    PERTINENT HISTORY:  Rt lumpectomy on 01/30/21 with 1/8 positive lymph node removed.  Completed radiation 04/15/21.  PAIN:  Are you having pain? No Breast pain intermittently - for no reason  PRECAUTIONS: Rt lymphedema risk  WEIGHT BEARING RESTRICTIONS No  FALLS:  Has patient fallen in last 6 months? 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[employer]   LEISURE: walking  HAND DOMINANCE : right   PRIOR LEVEL OF FUNCTION: Independent  PATIENT GOALS get swelling down all over   OBJECTIVE  Pt permission and consent throughout each step of examination and treatment with modification and draping if requested when working on sensitive areas   COGNITION:  Overall cognitive status: Within functional limits for tasks assessed   PALPATION: Breast fibrosis moderate medial and inferior lateral breast  OBSERVATIONS / OTHER ASSESSMENTS: Rt arm larger in seated, enlarged pores entire breast and nipple enlarged due to edema  POSTURE: WFL  UPPER EXTREMITY AROM/PROM:  A/PROM RIGHT   eval   Shoulder extension   Shoulder flexion 155 - pull  Shoulder abduction 153 - pull  Shoulder internal rotation   Shoulder external rotation     (Blank rows = not tested)  A/PROM LEFT   eval  Shoulder extension    Shoulder flexion 165  Shoulder abduction 160  Shoulder internal rotation   Shoulder external rotation     (Blank rows = not tested)  LYMPHEDEMA ASSESSMENTS:  LANDMARK RIGHT  eval 11/28/21 12/09/21 12/16/21 12/25/21  Upper arm   40 40.9   15cm proximal to olecranon  41.5 39.5  39.9   10 cm proximal to olecranon process 39.5 37.6  37.5 37.6  Olecranon process 27.5 27.3 27.5 27.6 27.5  15 cm proximal to ulnar styloid 28.5 28.4  29.1   10 cm proximal to ulnar styloid process 24.5 25  25.2 24.3  Just proximal to ulnar styloid process 18.2 16.4 16.4 16.8 16.8  Across hand at thumb web space 18.2 18.6  19.4 18.3  At base of 2nd digit 7.0 6.5  6.8 6.8  (Blank rows = not tested)  LANDMARK LEFT  eval     15cm proximal to olecranon  40.2  10 cm proximal to olecranon process 38  Olecranon process 26.5  15 cm proximal to ulnar styloid 26.5  10 cm proximal to ulnar styloid process 22.8  Just proximal to ulnar styloid process 15.7  Across hand at thumb web space 17.5  At base of 2nd digit 6.6  (Blank rows = not tested) Difference of  Breast edema questionnaire: 49/80: 61%  TODAY'S TREATMENT  12/25/21 Manual Therapy MLD: In supine: Short neck, 5 diaphragmatic breaths, bil axillary nodes and establishment of anterior inter-axillary pathway, Rt inguinal nodes and establishment of Rt axillo-inguinal pathway,then Rt breast and UE working from proximal to distal then finished retracing all steps. Reviewed a sleeve that pt got on [place] which seems to fit well and she was instructed to wear it for 1-3 hours to test for comfort for use during the flight.    12/23/21: Manual Therapy MLD: In supine: Short neck, 5 diaphragmatic breaths, Lt axillary nodes and establishment of anterior inter-axillary pathway, Rt inguinal nodes and establishment of Rt axillo-inguinal pathway,then Rt breast and UE working from proximal to distal then finished retracing all  steps  12/20/21: Therapeutic Exercises Pulleys into flexion and abduction x2  mins each returning therapist demo and VCs and demo throughout to decrease Rt scapular compensation Roll ball up wall into flexion x10 each returning therapist demo  Manual Therapy MLD: In supine: Short neck, 5 diaphragmatic breaths, Lt axillary nodes and establishment of anterior inter-axillary pathway, Rt inguinal nodes and establishment of Rt axillo-inguinal pathway,then Rt breast and UE working from proximal to distal then finished retracing all steps P/ROM to Rt shoulder during MLD into flexion, abd and D2 to pts tolerance  PATIENT EDUCATION:  Education details: per today's section Person educated: Patient Education method: Programmer, multimedia, Demonstration, Tactile cues, Verbal cues, and Handouts Education comprehension: verbalized understanding, returned demonstration, verbal cues required, tactile cues required, and needs further education  HOME EXERCISE PROGRAM: Self MLD Rt breast and UE  GARMENTS: sent demo to [facility] [employer] 11/22/21 for future garments which stated she has 80% coverage once the deductible has been met which is not yet met, has compression bra, sent over potential order for bella strong sleeve and glove 12/09/21  ASSESSMENT:  CLINICAL IMPRESSION: Continued POC. Her UE has a slight decrease in size and she is awaiting garments. Breast is also improving.   Her fibrosis in inferior breast continues to soften with MLD.   OBJECTIVE IMPAIRMENTS decreased activity tolerance, decreased knowledge of condition, decreased knowledge of use of DME, decreased ROM, increased edema, and increased fascial restrictions.   ACTIVITY LIMITATIONS carrying, lifting, and reach over head  PARTICIPATION LIMITATIONS: community activity  PERSONAL FACTORS Time since onset of injury/illness/exacerbation and 1-2 comorbidities: lymph node removal and radiation  are also affecting patient's functional outcome.   REHAB  POTENTIAL: Good  CLINICAL DECISION MAKING: Evolving/moderate complexity  EVALUATION COMPLEXITY: Moderate  GOALS: Goals reviewed with patient? Yes  SHORT TERM GOALS: Target date: 12/20/2021    Pt will be ind with self MLD for the Rt breast and arm  Baseline: Goal status: INITIAL  2.  Pt will be ind with self bandaging for the Rt UE with the help of her spouse as needed Baseline:  Goal status: INITIAL  LONG TERM GOALS: Target date: 01/17/2022    Pt will decrease volume by at least to demonstrate decreased risk of infection and fibrosis Baseline:  Goal status: INITIAL  2.  Pt will obtain appropriate compression for the arm and hand Baseline:  Goal status: INITIAL  3.  Pt will be ind with final HEP Baseline:  Goal status: INITIAL  PLAN: PT FREQUENCY: 3x/week  PT DURATION: 8 weeks  PLANNED INTERVENTIONS: Therapeutic exercises, Patient/Family education, Orthotic/Fit training, Prosthetic training, DME instructions, Manual lymph drainage, scar mobilization, Taping, Vasopneumatic device, Manual therapy, and Re-evaluation  PLAN FOR NEXT SESSION:  Speak with garment company/get garments ordered? Cont MLD to the Rt breast and UE, make foam, and shoulder ROM activities    Estephan Gallardo, Julieanne Manson, PT 12/25/2021, 9:53 AM  Manual Lymph Drainage for Right Breast. Do daily. Do slowly. Use flat hands with just enough pressure to stretch the skin. Do not slide over the skin, stretch the skin with the hand. (Stretch ? Relax ? Move) Lie down or sit comfortably (in a recliner, for example) to do this. 1. Hug yourself: cross arms and do circles at collar bones near neck 5-7 times (to "wake up" lots  of lymph nodes in this area). 2. Take slow deep breaths, allowing your belly to balloon out as you breathe in, 5x (to "wake  up" abdominal lymph nodes). 3. Both armpits--stretch skin in small circles to stimulate intact lymph nodes there, 5-7x.  4. Right groin area, at panty line--stretch skin in  small circles to stimulate lymph nodes 5-7x. 5. Redirect fluid from right chest toward left armpit (stretch skin starting at right chest in 3-4 spots  working toward left armpit) 3-4x across the chest. 6. Redirect fluid from right armpit toward right groin (cup your hand around the curve of your right side and do 3-4 "pumps" from armpit to groin) 3-4x down your side. 7. Draw an imaginary diagonal line from upper outer breast through the nipple area toward lower  inner breast. Direct fluid upward and inward from this line toward the pathway across your  upper chest (established in #5). Do this in three rows to treat all the upper inner breast and do  each row 3-4x. 8. Then direct the fluid down and out from this line toward the pathway down your side going  towards the left groin (established in #6). Do this in three rows and do each row 3-4x.  9. Then repeat your pathways (#5 and #6) 10.End with repeating #3 and #4 above. (circles in both armpits and the right groin

## 2021-12-27 ENCOUNTER — Ambulatory Visit: Payer: BC Managed Care – PPO | Admitting: Rehabilitation

## 2021-12-30 ENCOUNTER — Ambulatory Visit: Payer: BC Managed Care – PPO | Admitting: Rehabilitation

## 2021-12-30 ENCOUNTER — Encounter: Payer: Self-pay | Admitting: Rehabilitation

## 2021-12-30 DIAGNOSIS — Z170 Estrogen receptor positive status [ER+]: Secondary | ICD-10-CM

## 2021-12-30 DIAGNOSIS — Z483 Aftercare following surgery for neoplasm: Secondary | ICD-10-CM

## 2021-12-30 DIAGNOSIS — I890 Lymphedema, not elsewhere classified: Secondary | ICD-10-CM

## 2021-12-30 DIAGNOSIS — R293 Abnormal posture: Secondary | ICD-10-CM

## 2021-12-30 DIAGNOSIS — C50411 Malignant neoplasm of upper-outer quadrant of right female breast: Secondary | ICD-10-CM | POA: Diagnosis not present

## 2021-12-30 DIAGNOSIS — M25611 Stiffness of right shoulder, not elsewhere classified: Secondary | ICD-10-CM

## 2021-12-30 NOTE — Therapy (Addendum)
 OUTPATIENT PHYSICAL THERAPY ONCOLOGY TREATMENT  Patient Name: Carla Patrick MRN: [id number] DOB:30-Apr-1966, 56 y.o., female Today's Date: 12/30/2021   PT End of Session - 12/30/21 0814     Visit Number 9    Number of Visits 25    Date for PT Re-Evaluation 02/14/22    PT Start Time 0817   late   PT Stop Time 0854    PT Time Calculation (min) 37 min    Activity Tolerance Patient tolerated treatment well    Behavior During Therapy [facility] for tasks assessed/performed               Past Medical History:  Diagnosis Date   Abnormal EKG    Anemia    Anxiety    Asthma    Cancer (HCC) 11/2020   right breast [facility]   Depression    Heart murmur    History of radiation therapy    Right Breast- 03/18/21-04/15/21-Dr. Antony Blackbird   Hyperlipidemia    Hypertension    Past Surgical History:  Procedure Laterality Date   BREAST EXCISIONAL BIOPSY  2007   left   BREAST LUMPECTOMY WITH RADIOACTIVE SEED AND SENTINEL LYMPH NODE BIOPSY Right 01/30/2021   Procedure: RIGHT BREAST LUMPECTOMY WITH RADIOACTIVE SEED AND SENTINEL LYMPH NODE BIOPSY;  Surgeon: Griselda Miner, MD;  Location:  SURGERY CENTER;  Service: General;  Laterality: Right;   CHOLECYSTECTOMY  10/04/2011   Procedure: LAPAROSCOPIC CHOLECYSTECTOMY WITH INTRAOPERATIVE CHOLANGIOGRAM;  Surgeon: Emelia Loron, MD;  Location: [facility];  Service: General;  Laterality: N/A;   Patient Active Problem List   Diagnosis Date Noted   Malignant neoplasm of upper-outer quadrant of right breast in female, estrogen receptor positive (HCC) 12/24/2020   Right knee pain 11/08/2013   Abnormal EKG 07/15/2013    PCP: Merri Brunette, MD  REFERRING PROVIDER: Dr. Malachy Mood  REFERRING DIAG: C50.411,Z17.0 (ICD-10-CM) - Malignant neoplasm of upper-outer quadrant of right breast in female, estrogen receptor positive (HCC)  THERAPY DIAG:  Lymphedema, not elsewhere classified  Stiffness of right shoulder, not elsewhere classified  Aftercare  following surgery for neoplasm  Abnormal posture  Malignant neoplasm of upper-outer quadrant of right breast in female, estrogen receptor positive (HCC)  ONSET DATE: 01/30/21  Rationale for Evaluation and Treatment Rehabilitation  SUBJECTIVE                                                                                                                                                                                           SUBJECTIVE STATEMENT:   My travel went well.  The sleeve worked well.    PERTINENT HISTORY:  Rt lumpectomy on  01/30/21 with 1/8 positive lymph node removed.  Completed radiation 04/15/21.  PAIN:  Are you having pain? No Breast pain intermittently - for no reason  PRECAUTIONS: Rt lymphedema risk  WEIGHT BEARING RESTRICTIONS No  FALLS:  Has patient fallen in last 6 months? 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[employer]   LEISURE: walking  HAND DOMINANCE : right   PRIOR LEVEL OF FUNCTION: Independent  PATIENT GOALS get swelling down all over   OBJECTIVE  Pt permission and consent throughout each step of examination and treatment with modification and draping if requested when working on sensitive areas   COGNITION:  Overall cognitive status: Within functional limits for tasks assessed   PALPATION: Breast fibrosis moderate medial and inferior lateral breast  OBSERVATIONS / OTHER ASSESSMENTS: Rt arm larger in seated, enlarged pores entire breast and nipple enlarged due to edema  POSTURE: WFL  UPPER EXTREMITY AROM/PROM:  A/PROM RIGHT   eval   Shoulder extension   Shoulder flexion 155 - pull  Shoulder abduction 153 - pull  Shoulder internal rotation   Shoulder external rotation     (Blank rows = not tested)  A/PROM LEFT   eval  Shoulder extension   Shoulder flexion 165  Shoulder abduction 160  Shoulder internal rotation   Shoulder external rotation      (Blank rows = not tested)  LYMPHEDEMA ASSESSMENTS:  LANDMARK RIGHT  eval 11/28/21 12/09/21 12/16/21 12/25/21  Upper arm   40 40.9   15cm proximal to olecranon  41.5 39.5  39.9   10 cm proximal to olecranon process 39.5 37.6  37.5 37.6  Olecranon process 27.5 27.3 27.5 27.6 27.5  15 cm proximal to ulnar styloid 28.5 28.4  29.1   10 cm proximal to ulnar styloid process 24.5 25  25.2 24.3  Just proximal to ulnar styloid process 18.2 16.4 16.4 16.8 16.8  Across hand at thumb web space 18.2 18.6  19.4 18.3  At base of 2nd digit 7.0 6.5  6.8 6.8  (Blank rows = not tested)  LANDMARK LEFT  eval     15cm proximal to olecranon  40.2  10 cm proximal to olecranon process 38  Olecranon process 26.5  15 cm proximal to ulnar styloid 26.5  10 cm proximal to ulnar styloid process 22.8  Just proximal to ulnar styloid process 15.7  Across hand at thumb web space 17.5  At base of 2nd digit 6.6  (Blank rows = not tested) Difference of  Breast edema questionnaire: 49/80: 61%  TODAY'S TREATMENT  12/30/21 Therapeutic Exercises Pulleys into flexion and abduction x2 mins each returning therapist demo Roll ball up wall into flexion x10 each returning therapist demo  Manual Therapy MLD: In supine: Short neck, 5 diaphragmatic breaths, Lt axillary nodes and establishment of anterior inter-axillary pathway, Rt inguinal nodes and establishment of Rt axillo-inguinal pathway,then Rt breast and UE working from proximal to distal then finished retracing all steps P/ROM to Rt shoulder during MLD into flexion, abd and D2 to pts tolerance  12/25/21 Manual Therapy MLD: In supine: Short neck, 5 diaphragmatic breaths, bil axillary nodes and establishment of anterior inter-axillary pathway, Rt inguinal nodes and establishment of Rt axillo-inguinal pathway,then Rt breast and UE working from proximal to distal then finished retracing all steps. Reviewed a sleeve that pt got on [place] which seems  to fit well and she was instructed to wear it for  1-3 hours to test for comfort for use during the flight.    12/23/21: Manual Therapy MLD: In supine: Short neck, 5 diaphragmatic breaths, Lt axillary nodes and establishment of anterior inter-axillary pathway, Rt inguinal nodes and establishment of Rt axillo-inguinal pathway,then Rt breast and UE working from proximal to distal then finished retracing all steps  PATIENT EDUCATION:  Education details: per today's section Person educated: Patient Education method: Programmer, multimedia, Facilities manager, Actor cues, Verbal cues, and Handouts Education comprehension: verbalized understanding, returned demonstration, verbal cues required, tactile cues required, and needs further education  HOME EXERCISE PROGRAM: Self MLD Rt breast and UE  GARMENTS: sent demo to [employer] 11/22/21 for future garments which stated she has 80% coverage once the deductible has been met which is not yet met, has compression bra, sent over potential order for bella strong sleeve and glove 12/09/21  ASSESSMENT:  CLINICAL IMPRESSION: Pt continues to do well.  Still with Left breast fibrosis mostly inferior and medial which improves with MT.   OBJECTIVE IMPAIRMENTS decreased activity tolerance, decreased knowledge of condition, decreased knowledge of use of DME, decreased ROM, increased edema, and increased fascial restrictions.   ACTIVITY LIMITATIONS carrying, lifting, and reach over head  PARTICIPATION LIMITATIONS: community activity  PERSONAL FACTORS Time since onset of injury/illness/exacerbation and 1-2 comorbidities: lymph node removal and radiation  are also affecting patient's functional outcome.   REHAB POTENTIAL: Good  CLINICAL DECISION MAKING: Evolving/moderate complexity  EVALUATION COMPLEXITY: Moderate  GOALS: Goals reviewed with patient? Yes  SHORT TERM GOALS: Target date: 12/20/2021    Pt will be ind with self MLD for the Rt breast and arm   Baseline: Goal status: MET  2.  Pt will be ind with self bandaging for the Rt UE with the help of her spouse as needed Baseline:  Goal status: MET  LONG TERM GOALS: Target date: 01/17/2022    Pt will decrease volume by at least to demonstrate decreased risk of infection and fibrosis Baseline:  Goal status: NOT CALCULATED  2.  Pt will obtain appropriate compression for the arm and hand Baseline:  Goal status: MET  3.  Pt will be ind with final HEP Baseline:  Goal status: MET  PLAN: PT FREQUENCY: 3x/week  PT DURATION: 8 weeks  PLANNED INTERVENTIONS: Therapeutic exercises, Patient/Family education, Orthotic/Fit training, Prosthetic training, DME instructions, Manual lymph drainage, scar mobilization, Taping, Vasopneumatic device, Manual therapy, and Re-evaluation  PLAN FOR NEXT SESSION:  Speak with garment company/get garments ordered? Cont MLD to the Rt breast and UE, make foam, and shoulder ROM activities    Evaline Waltman, Julieanne Manson, PT 12/30/2021, 8:54 AM  Manual Lymph Drainage for Right Breast. Do daily. Do slowly. Use flat hands with just enough pressure to stretch the skin. Do not slide over the skin, stretch the skin with the hand. (Stretch ? Relax ? Move) Lie down or sit comfortably (in a recliner, for example) to do this. 1. Hug yourself: cross arms and do circles at collar bones near neck 5-7 times (to "wake up" lots  of lymph nodes in this area). 2. Take slow deep breaths, allowing your belly to balloon out as you breathe in, 5x (to "wake  up" abdominal lymph nodes). 3. Both armpits--stretch skin in small circles to stimulate intact lymph nodes there, 5-7x. 4. Right groin area, at panty line--stretch skin in small circles to stimulate lymph nodes 5-7x. 5. Redirect fluid from right chest toward left armpit (stretch skin starting at right chest in 3-4 spots  working toward  left armpit) 3-4x across the chest. 6. Redirect fluid from right armpit toward right groin (cup  your hand around the curve of your right side and do 3-4 "pumps" from armpit to groin) 3-4x down your side. 7. Draw an imaginary diagonal line from upper outer breast through the nipple area toward lower  inner breast. Direct fluid upward and inward from this line toward the pathway across your  upper chest (established in #5). Do this in three rows to treat all the upper inner breast and do  each row 3-4x. 8. Then direct the fluid down and out from this line toward the pathway down your side going  towards the left groin (established in #6). Do this in three rows and do each row 3-4x.  9. Then repeat your pathways (#5 and #6) 10.End with repeating #3 and #4 above. (circles in both armpits and the right groin  PHYSICAL THERAPY DISCHARGE SUMMARY  Visits from Start of Care: 9  Current functional level related to goals / functional outcomes: Per above   Remaining deficits: Chronic lymphedema    Education / Equipment: Self care HEP   Plan: Patient agrees to discharge.  Patient goals were partially met.

## 2022-01-07 ENCOUNTER — Other Ambulatory Visit: Payer: Self-pay

## 2022-01-07 ENCOUNTER — Ambulatory Visit: Payer: BC Managed Care – PPO | Admitting: Physical Therapy

## 2022-01-14 ENCOUNTER — Ambulatory Visit: Payer: BC Managed Care – PPO

## 2022-01-21 ENCOUNTER — Ambulatory Visit: Payer: BC Managed Care – PPO | Admitting: Rehabilitation

## 2022-01-28 ENCOUNTER — Ambulatory Visit: Payer: BC Managed Care – PPO | Admitting: Rehabilitation

## 2022-02-16 ENCOUNTER — Other Ambulatory Visit: Payer: Self-pay | Admitting: Hematology

## 2022-03-17 ENCOUNTER — Other Ambulatory Visit (HOSPITAL_COMMUNITY): Payer: Self-pay

## 2022-03-21 ENCOUNTER — Other Ambulatory Visit (HOSPITAL_COMMUNITY): Payer: Self-pay

## 2022-03-25 ENCOUNTER — Inpatient Hospital Stay: Payer: BC Managed Care – PPO | Admitting: Nurse Practitioner

## 2022-04-10 ENCOUNTER — Other Ambulatory Visit (HOSPITAL_COMMUNITY): Payer: Self-pay

## 2022-04-10 MED ORDER — WEGOVY 1.7 MG/0.75ML ~~LOC~~ SOAJ
1.7000 mg | SUBCUTANEOUS | 2 refills | Status: DC
  Filled 2022-04-10 (×2): qty 3, 28d supply, fill #0
  Filled 2022-11-13: qty 3, 28d supply, fill #1

## 2022-04-14 ENCOUNTER — Other Ambulatory Visit (HOSPITAL_COMMUNITY): Payer: Self-pay

## 2022-04-15 ENCOUNTER — Inpatient Hospital Stay: Payer: BC Managed Care – PPO | Attending: Nurse Practitioner | Admitting: Nurse Practitioner

## 2022-04-30 ENCOUNTER — Inpatient Hospital Stay: Payer: BC Managed Care – PPO | Admitting: Nurse Practitioner

## 2022-04-30 NOTE — Progress Notes (Deleted)
CLINIC:  Survivorship   REASON FOR VISIT:  Routine follow-up post-treatment for a recent history of breast cancer.  BRIEF ONCOLOGIC HISTORY:  Oncology History Overview Note   Cancer Staging  Malignant neoplasm of upper-outer quadrant of right breast in female, estrogen receptor positive ([place]) Staging form: Breast, AJCC 8th Edition - Clinical stage from 12/26/2020: Stage IA (cT1b, cN0, cM0, G2, ER+, PR-, HER2-) - Unsigned Stage prefix: Initial diagnosis Histologic grading system: 3 grade system Staged by: Pathologist and managing physician Stage used in treatment planning: Yes National guidelines used in treatment planning: Yes Type of national guideline used in treatment planning: NCCN - Pathologic stage from 01/12/2021: Stage IA (pT1c, pN0(i+), cM0, G2, ER+, PR-, HER2-, Oncotype DX score: 22) - Signed by Truitt Merle, MD on 04/27/2021 Stage prefix: Initial diagnosis Multigene prognostic tests performed: Oncotype DX Recurrence score range: Greater than or equal to 11 Histologic grading system: 3 grade system Residual tumor (R): R0 - None    Malignant neoplasm of upper-outer quadrant of right breast in female, estrogen receptor positive ([place])  12/05/2020 Mammogram   Right Breast Diagnostic Mammogram; Right Breast Ultrasound  IMPRESSION: The 0.4 x 0.5 x 0.7 cm taller-than-wide irregular mass in the right breast is suspicious of malignancy. No significant abnormalities were seen sonographically in the right axilla.   12/17/2020 Pathology Results   Diagnosis:  Breast, right, needle core biopsy, 12 O'CLOCK middle depth, 9 cmfn - INVASIVE MAMMARY CARCINOMA - MAMMARY CARCINOMA IN SITU  The carcinoma appears Nottingham grade 2 of 3. E-cadherin is negative in the invasive carcinoma, supporting lobular origin.  PROGNOSTIC INDICATORS Results: IMMUNOHISTOCHEMICAL AND MORPHOMETRIC ANALYSIS PERFORMED MANUALLY The tumor cells are EQUIVOCAL for Her2 (2+). Her2 by FISH will be performed and  results reported separately. Estrogen Receptor: 90%, POSITIVE, MODERATE STAINING INTENSITY Progesterone Receptor: 0%, NEGATIVE Proliferation Marker Ki67: 1%  FLUORESCENCE IN-SITU HYBRIDIZATION Results: GROUP 5: HER 2 **NEGATIVE** Equivocal form of amplification of the HER2 gene was detected in the IHC 2+ tissue sample received from this individual. HER2 FISH was performed by a technologist and cell imaging and analysis on the [employer]. RATIO OF ER2/CEN17 SIGNALS 1.15 AVERAGE HER2 COPY NUMBER PER CELL 1.95   12/24/2020 Initial Diagnosis   Malignant neoplasm of upper-outer quadrant of right breast in female, estrogen receptor positive ([place])   01/12/2021 Cancer Staging   Staging form: Breast, AJCC 8th Edition - Pathologic stage from 01/12/2021: Stage IA (pT1c, pN0(i+), cM0, G2, ER+, PR-, HER2-, Oncotype DX score: 22) - Signed by Truitt Merle, MD on 04/27/2021 Stage prefix: Initial diagnosis Multigene prognostic tests performed: Oncotype DX Recurrence score range: Greater than or equal to 11 Histologic grading system: 3 grade system Residual tumor (R): R0 - None   01/30/2021 Definitive Surgery   FINAL MICROSCOPIC DIAGNOSIS:   A. LYMPH NODE, RIGHT AXILLARY #1, SENTINEL, EXCISION:  -  Isolated tumor cells identified in one lymph node (0i/1)  -  See comment   B. LYMPH NODE, RIGHT AXILLARY, SENTINEL, EXCISION:  -  No carcinoma identified in one lymph node (0/1)  -  See comment   C. LYMPH NODE, RIGHT AXILLARY #2, SENTINEL, EXCISION:  -  No carcinoma identified in one lymph node (0/1)  -  See comment   D. LYMPH NODE, RIGHT AXILLARY, SENTINEL, EXCISION:  -  No carcinoma identified in one lymph node (0/1)  -  See comment   E. LYMPH NODE, RIGHT AXILLARY #3, SENTINEL, EXCISION:  -  No carcinoma identified in one lymph node (0/1)  -  See comment   F. LYMPH NODE, RIGHT AXILLARY #4, SENTINEL, EXCISION:  -  No carcinoma identified in one lymph node (0/1)  -  See comment   G. LYMPH NODE,  RIGHT AXILLARY, SENTINEL, EXCISION:  -  No carcinoma identified in one lymph node (0/1)  -  See comment   H. LYMPH NODE, RIGHT AXILLARY #5, SENTINEL, EXCISION:  -  Benign fibroadipose tissue  -  No lymphoid tissue identified   I. BREAST, RIGHT, LUMPECTOMY:  -  Invasive lobular carcinoma, Nottingham grade 2 of 3, 1.5 cm  -  Lobular neoplasia (atypical lobular hyperplasia)  -  Margins uninvolved by carcinoma (0.7 cm; inferior margin)  -  Previous biopsy site changes present  -  See oncology table and comment below    01/30/2021 Miscellaneous   Oncotype Recurrence Score of 22, risk of distant metastasis is 8% with antiestrogen therapy alone.   03/18/2021 - 04/15/2021 Radiation Therapy   Site Technique Total Dose (Gy) Dose per Fx (Gy) Completed Fx Beam Energies  Breast, Right: Breast_Rt 3D 40.05/40.05 2.67 15/15 10X  Breast, Right: Breast_Rt_Bst 3D 10/10 2 5/5 6X, 10X       INTERVAL HISTORY:  Ms. Betsch presents to the [facility] today for our initial meeting to review her survivorship care plan detailing her treatment course for breast cancer, as well as monitoring long-term side effects of that treatment, education regarding health maintenance, screening, and overall wellness and health promotion.     Overall, Ms. Sugrue reports feeling quite well since completing her radiation therapy approximately 3 months ago.  She ***    REVIEW OF SYSTEMS:  Review of Systems - Oncology Breast: Denies any new nodularity, masses, tenderness, nipple changes, or nipple discharge.      ONCOLOGY TREATMENT TEAM:  1. Surgeon:  Dr. Marland Kitchen at [facility] 2. Medical Oncologist: Dr. Marland Kitchen  3. Radiation Oncologist: Dr. Marland Kitchen    PAST MEDICAL/SURGICAL HISTORY:  Past Medical History:  Diagnosis Date   Abnormal EKG    Anemia    Anxiety    Asthma    Cancer ([place]) 11/2020   right breast [facility]   Depression    Heart murmur    History of radiation therapy    Right Breast-  03/18/21-04/15/21-Dr. Gery Pray   Hyperlipidemia    Hypertension    Past Surgical History:  Procedure Laterality Date   BREAST EXCISIONAL BIOPSY  2007   left   BREAST LUMPECTOMY WITH RADIOACTIVE SEED AND SENTINEL LYMPH NODE BIOPSY Right 01/30/2021   Procedure: RIGHT BREAST LUMPECTOMY WITH RADIOACTIVE SEED AND SENTINEL LYMPH NODE BIOPSY;  Surgeon: Jovita Kussmaul, MD;  Location: [facility];  Service: General;  Laterality: Right;   CHOLECYSTECTOMY  10/04/2011   Procedure: LAPAROSCOPIC CHOLECYSTECTOMY WITH INTRAOPERATIVE CHOLANGIOGRAM;  Surgeon: Rolm Bookbinder, MD;  Location: [facility];  Service: General;  Laterality: N/A;     ALLERGIES:  No Known Allergies   CURRENT MEDICATIONS:  Outpatient Encounter Medications as of 04/30/2022  Medication Sig Note   cholecalciferol (VITAMIN D3) 25 MCG (1000 UNIT) tablet Take 1,000 Units by mouth daily.    folic acid (FOLVITE) 096 MCG tablet Take 400 mcg by mouth daily.    hydrochlorothiazide (HYDRODIURIL) 25 MG tablet Take 12.5 mg by mouth daily.    Multiple Vitamins-Iron (MULTIVITAMINS WITH IRON) TABS tablet 1 tablet    PROAIR HFA 108 (90 BASE) MCG/ACT inhaler  11/08/2013: Received from: External Pharmacy   rosuvastatin (CRESTOR) 20 MG tablet Take 20 mg by mouth  daily.    rosuvastatin (CRESTOR) 20 MG tablet Take 1 tablet (20 mg total) by mouth daily.    Semaglutide-Weight Management (WEGOVY [place]) Inject into the skin.    Semaglutide-Weight Management (WEGOVY) 0.5 MG/0.5ML SOAJ Inject 0.5 mg into the skin once a week.    Semaglutide-Weight Management (WEGOVY) 1 MG/0.5ML SOAJ Inject 1 mg into the skin once a week.    Semaglutide-Weight Management (WEGOVY) 1.7 MG/0.75ML SOAJ Inject 1.7 mg into the skin once a week.    Semaglutide-Weight Management (WEGOVY) 1.7 MG/0.75ML SOAJ Inject 1.7 mg into the skin once a week.    SYMBICORT 160-4.5 MCG/ACT inhaler SMARTSIG:2 Puff(s) By Mouth Twice Daily PRN    tamoxifen (NOLVADEX) 10 MG tablet TAKE 1  TABLET(10 MG) BY MOUTH TWICE DAILY    telmisartan (MICARDIS) 40 MG tablet Take 1 tablet (40 mg total) by mouth daily.    telmisartan (MICARDIS) 80 MG tablet Take 80 mg by mouth daily.    No facility-administered encounter medications on file as of 04/30/2022.     ONCOLOGIC FAMILY HISTORY:  Family History  Problem Relation Age of Onset   HIV Father    Heart disease Maternal Grandmother    Diabetes Maternal Grandfather    Diabetes Paternal Grandmother      GENETIC COUNSELING/TESTING: ***  SOCIAL HISTORY:  JOSIAH WOJTASZEK is /single/married/divorced/widowed/separated and lives alone/with her spouse/family/friend in (city), [place].  She has (#) children and they live in (city).  Ms. Borrayo is currently retired/disabled/working part-time/full-time as ***.  She denies any current or history of tobacco, alcohol, or illicit drug use.     PHYSICAL EXAMINATION:  Vital Signs:  There were no vitals filed for this visit. There were no vitals filed for this visit. General: Well-nourished, well-appearing female in no acute distress.  She is unaccompanied/accompanied in clinic by her ***** today.   HEENT: Head is normocephalic.  Pupils equal and reactive to light. Conjunctivae clear without exudate.  Sclerae anicteric. Oral mucosa is pink, moist.  Oropharynx is pink without lesions or erythema.  Lymph: No cervical, supraclavicular, or infraclavicular lymphadenopathy noted on palpation.  Cardiovascular: Regular rate and rhythm.Marland Kitchen Respiratory: Clear to auscultation bilaterally. Chest expansion symmetric; breathing non-labored.  GI: Abdomen soft and round; non-tender, non-distended. Bowel sounds normoactive.  GU: Deferred.  Neuro: No focal deficits. Steady gait.  Psych: Mood and affect normal and appropriate for situation.  Extremities: No edema. MSK: No focal spinal tenderness to palpation.  Full range of motion in bilateral upper extremities Skin: Warm and dry.  LABORATORY DATA:   None for this visit.  DIAGNOSTIC IMAGING:  None for this visit.      ASSESSMENT AND PLAN:  Ms.. Roper is a pleasant 56 y.o. female with Stage IA right breast invasive lobular carcinoma, ER+/PR-/HER2-, diagnosed in 12/2020, treated with lumpectomy, adjuvant radiation therapy, and anti-estrogen therapy with Tamoxifen beginning in 06/2021.  She presents to the Survivorship Clinic for our initial meeting and routine follow-up post-completion of treatment for breast cancer.    1. Stage IA right breast cancer:  Ms. Pai is continuing to recover from definitive treatment for breast cancer. She will follow-up with her medical oncologist, Dr. Burr Medico in 3 months with history and physical exam per surveillance protocol.  She will continue her anti-estrogen therapy with Tamoxifen. Thus far, she is tolerating the *** well, with minimal side effects. She was instructed to make Dr. Burr Medico or myself aware if she begins to experience any worsening side effects of the medication and I could  see her back in clinic to help manage those side effects, as needed. Though the incidence is low, there is an associated risk of endometrial cancer with anti-estrogen therapies like Tamoxifen.  Ms. Shetterly was encouraged to contact Dr. Burr Medico or myself with any vaginal bleeding while taking Tamoxifen. Other side effects of Tamoxifen were again reviewed with her as well. Today, a comprehensive survivorship care plan and treatment summary was reviewed with the patient today detailing her breast cancer diagnosis, treatment course, potential late/long-term effects of treatment, appropriate follow-up care with recommendations for the future, and patient education resources.  A copy of this summary, along with a letter will be sent to the patient's primary care provider via mail/fax/In Basket message after today's visit.    #. Problem(s) at Visit______________  #. Bone health:  Ms. Macaluso is perimenopausal and taking tamoxifen which has bone  strengthening qualities. It is reasonable to hold DEXA screening for now and begin once she is postmenopausal and/or switches to AI.  In the meantime, she was encouraged to increase her consumption of foods rich in calcium, as well as increase her weight-bearing activities.  She was given education on specific activities to promote bone health.  #. Cancer screening:  Due to Ms. Geddis's history and her age, she should receive screening for skin cancers, colon cancer, and gynecologic cancers.  The information and recommendations are listed on the patient's comprehensive care plan/treatment summary and were reviewed in detail with the patient.    #. Health maintenance and wellness promotion: Ms. Addair was encouraged to consume 5-7 servings of fruits and vegetables per day. We reviewed the "Nutrition Rainbow" handout, as well as the handout "Take Control of Your Health and Reduce Your Cancer Risk" from the [place].  She was also encouraged to engage in moderate to vigorous exercise for 30 minutes per day most days of the week. We discussed the [employer] fitness program, which is designed for cancer survivors to help them become more physically fit after cancer treatments.  She was instructed to limit her alcohol consumption and continue to abstain from tobacco use/***was encouraged stop smoking.     #. Support services/counseling: It is not uncommon for this period of the patient's cancer care trajectory to be one of many emotions and stressors.  We discussed an opportunity for her to participate in the next session of [facility] ("Finding Your New Normal") support group series designed for patients after they have completed treatment.   Ms. Seelinger was encouraged to take advantage of our many other support services programs, support groups, and/or counseling in coping with her new life as a cancer survivor after completing anti-cancer treatment.  She was offered support today through active  listening and expressive supportive counseling.  She was given information regarding our available services and encouraged to contact me with any questions or for help enrolling in any of our support group/programs.    Dispo:   -Return to cancer center ***  -Mammogram due in *** -Follow up with surgery *** -She is welcome to return back to the Survivorship Clinic at any time; no additional follow-up needed at this time.  -Consider referral back to survivorship as a long-term survivor for continued surveillance  A total of (30) minutes of face-to-face time was spent with this patient with greater than 50% of that time in counseling and care-coordination.   Cira Rue, NP Survivorship Program [place] [phone]   Note: PRIMARY CARE PROVIDER Deland Pretty, [place] [phone]

## 2022-08-19 ENCOUNTER — Encounter: Payer: Self-pay | Admitting: Hematology

## 2022-09-09 ENCOUNTER — Encounter: Payer: Self-pay | Admitting: Internal Medicine

## 2022-11-10 ENCOUNTER — Ambulatory Visit (INDEPENDENT_AMBULATORY_CARE_PROVIDER_SITE_OTHER): Payer: BC Managed Care – PPO

## 2022-11-10 ENCOUNTER — Ambulatory Visit: Payer: BC Managed Care – PPO | Admitting: Podiatry

## 2022-11-10 ENCOUNTER — Encounter: Payer: Self-pay | Admitting: Podiatry

## 2022-11-10 DIAGNOSIS — M76821 Posterior tibial tendinitis, right leg: Secondary | ICD-10-CM | POA: Diagnosis not present

## 2022-11-10 MED ORDER — MELOXICAM 15 MG PO TABS: 15.0000 mg | ORAL_TABLET | Freq: Every day | ORAL | 0 refills | Status: DC

## 2022-11-10 NOTE — Patient Instructions (Signed)
Posterior Tibial Tendinitis Rehab Ask your health care provider which exercises are safe for you. Do exercises exactly as told by your provider and adjust them as directed. It's normal to feel mild stretching, pulling, tightness, or discomfort as you do these exercises. Stop right away if you feel sudden pain or your pain gets worse. Do not begin these exercises until told by your provider. Stretching and range-of-motion exercises These exercises warm up your muscles and joints and improve the movement and flexibility in your ankle and foot. These exercises may also help to relieve pain. Standing wall calf stretch, knee straight  Stand with your hands against a wall. Extend your left / right leg behind you, and bend your front knee slightly. If told, place a folded washcloth under the arch of your foot for support. Point the toes of your back foot slightly inward. Keeping your heels on the floor and your back knee straight, shift your weight toward the wall. Do not allow your back to arch. You should feel a gentle stretch in your upper left / right calf. Hold this position for __________ seconds. Repeat __________ times. Complete this exercise __________ times a day. Standing wall calf stretch, knee bent  Stand with your hands against a wall. Extend your left / right leg behind you, and bend your front knee slightly. If told, place a folded washcloth under the arch of your foot for support. Point the toes of your back foot slightly inward. Unlock your back knee so it's bent. Keep your heels on the floor. You should feel a gentle stretch deep in your lower left / right calf. Hold this position for __________ seconds. Repeat __________ times. Complete this exercise __________ times a day. Strengthening exercises These exercises build strength and endurance in your ankle and foot. Endurance is the ability to use your muscles for a long time, even after they get tired. Ankle inversion with  band  Secure one end of a rubber exercise band or tubing to a fixed object, such as a table leg or a pole, that will stay still when the band is pulled. Loop the other end of the band around the middle of your left / right foot. Sit on the floor facing the object with your left / right leg extended. The band or tube should be slightly tense when your foot is relaxed. Leading with your big toe, slowly bring your left / right foot and ankle inward, toward your other foot (inversion). Hold this position for __________ seconds. Slowly return your foot to the starting position. Repeat __________ times. Complete this exercise __________ times a day. Towel curls  Sit in a chair on a non-carpeted surface, and put your feet on the floor. Place a towel in front of your feet. If told by your provider, add a __________ weight to the end of the towel. Keeping your heel on the floor, put your left / right foot on the towel. Pull the towel toward you by grabbing the towel with your toes and curling them under. Keep your heel on the floor while you do this. Let your toes relax. Grab the towel with your toes again. Keep going until the towel is completely underneath your foot. Repeat __________ times. Complete this exercise __________ times a day. Balance exercise This exercise improves or maintains your balance. Balance is important in preventing falls. Single leg stand  Without wearing shoes, stand near a railing or in a doorway. You may hold on to the railing or   doorframe as needed for balance. Stand on your left / right foot. Keep your big toe down on the floor and try to keep your arch lifted. If balancing in this position is too easy, try the exercise with your eyes closed or while standing on a pillow. Hold this position for __________ seconds. Repeat __________ times. Complete this exercise __________ times a day. This information is not intended to replace advice given to you by your health care  provider. Make sure you discuss any questions you have with your health care provider. Document Revised: 05/21/2022 Document Reviewed: 05/21/2022 Elsevier Patient Education  2024 Elsevier Inc.  

## 2022-11-10 NOTE — Progress Notes (Signed)
  Subjective:  Patient ID: Carla Patrick, female    DOB: [birth year],   MRN: [id number]  Chief Complaint  Patient presents with   Ankle Pain    Right ankle pain on going for weeks painful when pressure is applied     57 y.o. female presents for concern of right ankle pain that has been going on for weeks. Relates painful when walking. She has tried wearing an ankle brace which has been helpful.Relates she has tried to get back into walking and thinks that may have flared it up.  Denies any other pedal complaints. Denies n/v/f/c.   Past Medical History:  Diagnosis Date   Abnormal EKG    Anemia    Anxiety    Asthma    Cancer ([facility]) 11/2020   right breast [facility]   Depression    Heart murmur    History of radiation therapy    Right Breast- 03/18/21-04/15/21-Dr. Antony Blackbird   Hyperlipidemia    Hypertension     Objective:  Physical Exam: Vascular: DP/PT pulses 2/4 bilateral. CFT <3 seconds. Normal hair growth on digits. No edema.  Skin. No lacerations or abrasions bilateral feet.  Musculoskeletal: MMT 5/5 bilateral lower extremities in DF, PF, Inversion and Eversion. Deceased ROM in DF of ankle joint. Tender to insertion of PT tendon on the right and some pain more proximally along the tendon to the medial malleolus. Pain with inversion of the foot. Unable to perform single limb heel rise.  Neurological: Sensation intact to light touch.   Assessment:   1. Posterior tibial tendon dysfunction (PTTD) of right lower extremity      Plan:  Patient was evaluated and treated and all questions answered X-rays reviewed and discussed with patient. No acute fractures or dislocations. There is mild collapse of the medial arch noted.  Discussed PTTD diagnosis and treatment options with patient. Stretching exercises discussed and handout dispensed. Prescription for meloxicam provided. CMP reviewed and Kidney function normal.  Continue with brace at home.  Discussed if there is no improvement  PT/MRI/injection may be an option. Patient to return to clinic in 6 to 8 weeks or sooner if symptoms fail to improve or worsen.   Louann Sjogren, DPM

## 2022-11-13 ENCOUNTER — Other Ambulatory Visit (HOSPITAL_COMMUNITY): Payer: Self-pay

## 2022-11-27 ENCOUNTER — Other Ambulatory Visit (HOSPITAL_COMMUNITY): Payer: Self-pay

## 2022-12-08 ENCOUNTER — Telehealth: Payer: Self-pay | Admitting: Nurse Practitioner

## 2022-12-09 ENCOUNTER — Other Ambulatory Visit (HOSPITAL_COMMUNITY): Payer: Self-pay

## 2022-12-09 MED ORDER — OZEMPIC (0.25 OR 0.5 MG/DOSE) 2 MG/3ML ~~LOC~~ SOPN
0.5000 mg | PEN_INJECTOR | SUBCUTANEOUS | 0 refills | Status: DC
  Filled 2022-12-09: qty 3, 28d supply, fill #0

## 2022-12-10 ENCOUNTER — Ambulatory Visit: Payer: BC Managed Care – PPO | Admitting: Podiatry

## 2022-12-10 ENCOUNTER — Encounter: Payer: Self-pay | Admitting: Podiatry

## 2022-12-10 ENCOUNTER — Other Ambulatory Visit (HOSPITAL_COMMUNITY): Payer: Self-pay

## 2022-12-10 DIAGNOSIS — M79672 Pain in left foot: Secondary | ICD-10-CM | POA: Diagnosis not present

## 2022-12-10 DIAGNOSIS — M76821 Posterior tibial tendinitis, right leg: Secondary | ICD-10-CM | POA: Diagnosis not present

## 2022-12-10 MED ORDER — TRIAMCINOLONE ACETONIDE 10 MG/ML IJ SUSP
10.0000 mg | Freq: Once | INTRAMUSCULAR | Status: AC
  Administered 2022-12-10: 10 mg

## 2022-12-10 MED ORDER — DEXAMETHASONE SODIUM PHOSPHATE 120 MG/30ML IJ SOLN
4.0000 mg | Freq: Once | INTRAMUSCULAR | Status: AC
  Administered 2022-12-10: 4 mg via INTRA_ARTICULAR

## 2022-12-10 NOTE — Progress Notes (Signed)
  Subjective:  Patient ID: Carla Patrick, female    DOB: 1965-12-09,   MRN: [id number]  No chief complaint on file.   57 y.o. female presents for follow-up of right PTTD. Relates still have pain about the same. No worse. Has been wearing brace and stretching as much as she can. Has a trip coming up. .  Denies any other pedal complaints. Denies n/v/f/c.   Past Medical History:  Diagnosis Date   Abnormal EKG    Anemia    Anxiety    Asthma    Cancer ([facility]) 11/2020   right breast [facility]   Depression    Heart murmur    History of radiation therapy    Right Breast- 03/18/21-04/15/21-Dr. Antony Blackbird   Hyperlipidemia    Hypertension     Objective:  Physical Exam: Vascular: DP/PT pulses 2/4 bilateral. CFT <3 seconds. Normal hair growth on digits. No edema.  Skin. No lacerations or abrasions bilateral feet.  Musculoskeletal: MMT 5/5 bilateral lower extremities in DF, PF, Inversion and Eversion. Deceased ROM in DF of ankle joint. Tender to insertion of PT tendon on the right and some pain more proximally along the tendon to the medial malleolus. Pain with inversion of the foot. Unable to perform single limb heel rise.  Neurological: Sensation intact to light touch.   Assessment:   1. Posterior tibial tendon dysfunction (PTTD) of right lower extremity       Plan:  Patient was evaluated and treated and all questions answered X-rays reviewed and discussed with patient. No acute fractures or dislocations. There is mild collapse of the medial arch noted.  Discussed PTTD diagnosis and treatment options with patient. Conitnue stretching and anti-inflammatories  Fitted for CMO.  Injection offered today. Procedure below.   Continue with brace at home.  Discussed if there is no improvement PT/MRI/injection may be an option. Patient to return to clinic in 6 to 8 weeks or sooner if symptoms fail to improve or worsen.  Procedure: Injection Tendon/Ligament Discussed alternatives, risks,  complications and verbal consent was obtained.  Location: Right PT tendon. Skin Prep: Alcohol. Injectate: 1cc 0.5% marcaine plain, 1 cc dexamethasone 0.5 cc kenalog  Disposition: Patient tolerated procedure well. Injection site dressed with a band-aid.  Post-injection care was discussed and return precautions discussed.     Louann Sjogren, DPM

## 2022-12-15 NOTE — Progress Notes (Unsigned)
CLINIC:  Survivorship   REASON FOR VISIT:  Routine follow-up post-treatment for a recent history of breast cancer.  BRIEF ONCOLOGIC HISTORY:  Oncology History Overview Note   Cancer Staging  Malignant neoplasm of upper-outer quadrant of right breast in female, estrogen receptor positive (HCC) Staging form: Breast, AJCC 8th Edition - Clinical stage from 12/26/2020: Stage IA (cT1b, cN0, cM0, G2, ER+, PR-, HER2-) - Unsigned Stage prefix: Initial diagnosis Histologic grading system: 3 grade system Staged by: Pathologist and managing physician Stage used in treatment planning: Yes National guidelines used in treatment planning: Yes Type of national guideline used in treatment planning: NCCN - Pathologic stage from 01/12/2021: Stage IA (pT1c, pN0(i+), cM0, G2, ER+, PR-, HER2-, Oncotype DX score: 22) - Signed by Malachy Mood, MD on 04/27/2021 Stage prefix: Initial diagnosis Multigene prognostic tests performed: Oncotype DX Recurrence score range: Greater than or equal to 11 Histologic grading system: 3 grade system Residual tumor (R): R0 - None    Malignant neoplasm of upper-outer quadrant of right breast in female, estrogen receptor positive (HCC)  12/05/2020 Mammogram   Right Breast Diagnostic Mammogram; Right Breast Ultrasound  IMPRESSION: The 0.4 x 0.5 x 0.7 cm taller-than-wide irregular mass in the right breast is suspicious of malignancy. No significant abnormalities were seen sonographically in the right axilla.   12/17/2020 Pathology Results   Diagnosis:  Breast, right, needle core biopsy, 12 O'CLOCK middle depth, 9 cmfn - INVASIVE MAMMARY CARCINOMA - MAMMARY CARCINOMA IN SITU  The carcinoma appears Nottingham grade 2 of 3. E-cadherin is negative in the invasive carcinoma, supporting lobular origin.  PROGNOSTIC INDICATORS Results: IMMUNOHISTOCHEMICAL AND MORPHOMETRIC ANALYSIS PERFORMED MANUALLY The tumor cells are EQUIVOCAL for Her2 (2+). Her2 by FISH will be performed and  results reported separately. Estrogen Receptor: 90%, POSITIVE, MODERATE STAINING INTENSITY Progesterone Receptor: 0%, NEGATIVE Proliferation Marker Ki67: 1%  FLUORESCENCE IN-SITU HYBRIDIZATION Results: GROUP 5: HER 2 **NEGATIVE** Equivocal form of amplification of the HER2 gene was detected in the IHC 2+ tissue sample received from this individual. HER2 FISH was performed by a technologist and cell imaging and analysis on the BioView. RATIO OF ER2/CEN17 SIGNALS 1.15 AVERAGE HER2 COPY NUMBER PER CELL 1.95   12/24/2020 Initial Diagnosis   Malignant neoplasm of upper-outer quadrant of right breast in female, estrogen receptor positive (HCC)   01/12/2021 Cancer Staging   Staging form: Breast, AJCC 8th Edition - Pathologic stage from 01/12/2021: Stage IA (pT1c, pN0(i+), cM0, G2, ER+, PR-, HER2-, Oncotype DX score: 22) - Signed by Malachy Mood, MD on 04/27/2021 Stage prefix: Initial diagnosis Multigene prognostic tests performed: Oncotype DX Recurrence score range: Greater than or equal to 11 Histologic grading system: 3 grade system Residual tumor (R): R0 - None   01/30/2021 Definitive Surgery   FINAL MICROSCOPIC DIAGNOSIS:   A. LYMPH NODE, RIGHT AXILLARY #1, SENTINEL, EXCISION:  -  Isolated tumor cells identified in one lymph node (0i/1)  -  See comment   B. LYMPH NODE, RIGHT AXILLARY, SENTINEL, EXCISION:  -  No carcinoma identified in one lymph node (0/1)  -  See comment   C. LYMPH NODE, RIGHT AXILLARY #2, SENTINEL, EXCISION:  -  No carcinoma identified in one lymph node (0/1)  -  See comment   D. LYMPH NODE, RIGHT AXILLARY, SENTINEL, EXCISION:  -  No carcinoma identified in one lymph node (0/1)  -  See comment   E. LYMPH NODE, RIGHT AXILLARY #3, SENTINEL, EXCISION:  -  No carcinoma identified in one lymph node (0/1)  -  See comment   F. LYMPH NODE, RIGHT AXILLARY #4, SENTINEL, EXCISION:  -  No carcinoma identified in one lymph node (0/1)  -  See comment   G. LYMPH NODE,  RIGHT AXILLARY, SENTINEL, EXCISION:  -  No carcinoma identified in one lymph node (0/1)  -  See comment   H. LYMPH NODE, RIGHT AXILLARY #5, SENTINEL, EXCISION:  -  Benign fibroadipose tissue  -  No lymphoid tissue identified   I. BREAST, RIGHT, LUMPECTOMY:  -  Invasive lobular carcinoma, Nottingham grade 2 of 3, 1.5 cm  -  Lobular neoplasia (atypical lobular hyperplasia)  -  Margins uninvolved by carcinoma (0.7 cm; inferior margin)  -  Previous biopsy site changes present  -  See oncology table and comment below    01/30/2021 Miscellaneous   Oncotype Recurrence Score of 22, risk of distant metastasis is 8% with antiestrogen therapy alone.   03/18/2021 - 04/15/2021 Radiation Therapy   Site Technique Total Dose (Gy) Dose per Fx (Gy) Completed Fx Beam Energies  Breast, Right: Breast_Rt 3D 40.05/40.05 2.67 15/15 10X  Breast, Right: Breast_Rt_Bst 3D 10/10 2 5/5 6X, 10X       INTERVAL HISTORY:  Ms. Picking presents to the Survivorship Clinic today for our initial meeting to review her survivorship care plan detailing her treatment course for breast cancer, as well as monitoring long-term side effects of that treatment, education regarding health maintenance, screening, and overall wellness and health promotion.     Overall, Carla Patrick reports feeling quite well since completing her radiation therapy approximately 3 months ago.  She ***    REVIEW OF SYSTEMS:  Review of Systems - Oncology Breast: Denies any new nodularity, masses, tenderness, nipple changes, or nipple discharge.      ONCOLOGY TREATMENT TEAM:  1. Surgeon:  Dr. Marland Kitchen at [facility] 2. Medical Oncologist: Dr. Marland Kitchen  3. Radiation Oncologist: Dr. Marland Kitchen    PAST MEDICAL/SURGICAL HISTORY:  Past Medical History:  Diagnosis Date   Abnormal EKG    Anemia    Anxiety    Asthma    Cancer (HCC) 11/2020   right breast [facility]   Depression    Heart murmur    History of radiation therapy    Right Breast-  03/18/21-04/15/21-Dr. Antony Blackbird   Hyperlipidemia    Hypertension    Past Surgical History:  Procedure Laterality Date   BREAST EXCISIONAL BIOPSY  2007   left   BREAST LUMPECTOMY WITH RADIOACTIVE SEED AND SENTINEL LYMPH NODE BIOPSY Right 01/30/2021   Procedure: RIGHT BREAST LUMPECTOMY WITH RADIOACTIVE SEED AND SENTINEL LYMPH NODE BIOPSY;  Surgeon: Griselda Miner, MD;  Location: [facility];  Service: General;  Laterality: Right;   CHOLECYSTECTOMY  10/04/2011   Procedure: LAPAROSCOPIC CHOLECYSTECTOMY WITH INTRAOPERATIVE CHOLANGIOGRAM;  Surgeon: Emelia Loron, MD;  Location: [facility];  Service: General;  Laterality: N/A;     ALLERGIES:  No Known Allergies   CURRENT MEDICATIONS:  Outpatient Encounter Medications as of 12/16/2022  Medication Sig Note   cholecalciferol (VITAMIN D3) 25 MCG (1000 UNIT) tablet Take 1,000 Units by mouth daily.    folic acid (FOLVITE) 800 MCG tablet Take 400 mcg by mouth daily.    hydrochlorothiazide (HYDRODIURIL) 25 MG tablet Take 12.5 mg by mouth daily.    meloxicam (MOBIC) 15 MG tablet Take 1 tablet (15 mg total) by mouth daily.    Multiple Vitamins-Iron (MULTIVITAMINS WITH IRON) TABS tablet 1 tablet    PROAIR HFA 108 (90 BASE) MCG/ACT inhaler  11/08/2013: Received from: External Pharmacy   rosuvastatin (CRESTOR) 20 MG tablet Take 20 mg by mouth daily.    rosuvastatin (CRESTOR) 20 MG tablet Take 1 tablet (20 mg total) by mouth daily.    Semaglutide,0.25 or 0.5MG /DOS, (OZEMPIC, 0.25 OR 0.5 MG/DOSE,) 2 MG/3ML SOPN Inject 0.5 mg into the skin once a week.    Semaglutide-Weight Management (WEGOVY [place]) Inject into the skin.    Semaglutide-Weight Management (WEGOVY) 0.5 MG/0.5ML SOAJ Inject 0.5 mg into the skin once a week.    Semaglutide-Weight Management (WEGOVY) 1 MG/0.5ML SOAJ Inject 1 mg into the skin once a week.    Semaglutide-Weight Management (WEGOVY) 1.7 MG/0.75ML SOAJ Inject 1.7 mg into the skin once a week.    Semaglutide-Weight  Management (WEGOVY) 1.7 MG/0.75ML SOAJ Inject 1.7 mg into the skin once a week.    SYMBICORT 160-4.5 MCG/ACT inhaler SMARTSIG:2 Puff(s) By Mouth Twice Daily PRN    tamoxifen (NOLVADEX) 10 MG tablet TAKE 1 TABLET(10 MG) BY MOUTH TWICE DAILY    telmisartan (MICARDIS) 40 MG tablet Take 1 tablet (40 mg total) by mouth daily.    telmisartan (MICARDIS) 80 MG tablet Take 80 mg by mouth daily.    No facility-administered encounter medications on file as of 12/16/2022.     ONCOLOGIC FAMILY HISTORY:  Family History  Problem Relation Age of Onset   HIV Father    Heart disease Maternal Grandmother    Diabetes Maternal Grandfather    Diabetes Paternal Grandmother      GENETIC COUNSELING/TESTING: ***  SOCIAL HISTORY:  SALLYANN KINNAIRD is /single/married/divorced/widowed/separated and lives alone/with her spouse/family/friend in (city), [place].  She has (#) children and they live in (city).  Ms. Wahlstrom is currently retired/disabled/working part-time/full-time as ***.  She denies any current or history of tobacco, alcohol, or illicit drug use.     PHYSICAL EXAMINATION:  Vital Signs:  There were no vitals filed for this visit. There were no vitals filed for this visit. General: Well-nourished, well-appearing female in no acute distress.  She is unaccompanied/accompanied in clinic by her ***** today.   HEENT: Head is normocephalic.  Pupils equal and reactive to light. Conjunctivae clear without exudate.  Sclerae anicteric. Oral mucosa is pink, moist.  Oropharynx is pink without lesions or erythema.  Lymph: No cervical, supraclavicular, or infraclavicular lymphadenopathy noted on palpation.  Cardiovascular: Regular rate and rhythm.Marland Kitchen Respiratory: Clear to auscultation bilaterally. Chest expansion symmetric; breathing non-labored.  GI: Abdomen soft and round; non-tender, non-distended. Bowel sounds normoactive.  GU: Deferred.  Neuro: No focal deficits. Steady gait.  Psych: Mood and affect  normal and appropriate for situation.  Extremities: No edema. MSK: No focal spinal tenderness to palpation.  Full range of motion in bilateral upper extremities Skin: Warm and dry.  LABORATORY DATA:  None for this visit.  DIAGNOSTIC IMAGING:  None for this visit.      ASSESSMENT AND PLAN:  Ms.. Patrick is a pleasant 57 y.o. female with Stage *** right/left breast invasive ductal carcinoma, ER+/PR+/HER2-, diagnosed in (date), treated with lumpectomy, adjuvant radiation therapy, and anti-estrogen therapy with *** beginning in (date).  She presents to the Survivorship Clinic for our initial meeting and routine follow-up post-completion of treatment for breast cancer.    1. Stage *** right/left breast cancer:  Ms. Rail is continuing to recover from definitive treatment for breast cancer. She will follow-up with her medical oncologist, Dr. Lucrezia Starch in (month) /2017 with history and physical exam per surveillance protocol.  She will continue her anti-estrogen  therapy with (drug). Thus far, she is tolerating the *** well, with minimal side effects. She was instructed to make Dr. Pamelia Hoit or myself aware if she begins to experience any worsening side effects of the medication and I could see her back in clinic to help manage those side effects, as needed. Though the incidence is low, there is an associated risk of endometrial cancer with anti-estrogen therapies like Tamoxifen.  Ms. Campas was encouraged to contact Dr. Devona Konig or myself with any vaginal bleeding while taking Tamoxifen. Other side effects of Tamoxifen were again reviewed with her as well. Today, a comprehensive survivorship care plan and treatment summary was reviewed with the patient today detailing her breast cancer diagnosis, treatment course, potential late/long-term effects of treatment, appropriate follow-up care with recommendations for the future, and patient education resources.  A copy of this summary, along with  a letter will be sent to the patient's primary care provider via mail/fax/In Basket message after today's visit.    #. Problem(s) at Visit______________  #. Bone health:  Given Ms. Puller's age/history of breast cancer and her current treatment regimen including anti-estrogen therapy with _______, she is at risk for bone demineralization.  Her last DEXA scan was **/**/20**, which showed (results).***  In the meantime, she was encouraged to increase her consumption of foods rich in calcium, as well as increase her weight-bearing activities.  She was given education on specific activities to promote bone health.  #. Cancer screening:  Due to Ms. Clemon's history and her age, she should receive screening for skin cancers, colon cancer, and gynecologic cancers.  The information and recommendations are listed on the patient's comprehensive care plan/treatment summary and were reviewed in detail with the patient.    #. Health maintenance and wellness promotion: Ms. Cinque was encouraged to consume 5-7 servings of fruits and vegetables per day. We reviewed the "Nutrition Rainbow" handout, as well as the handout "Take Control of Your Health and Reduce Your Cancer Risk" from the American Cancer Society.  She was also encouraged to engage in moderate to vigorous exercise for 30 minutes per day most days of the week. We discussed the [employer] fitness program, which is designed for cancer survivors to help them become more physically fit after cancer treatments.  She was instructed to limit her alcohol consumption and continue to abstain from tobacco use/***was encouraged stop smoking.     #. Support services/counseling: It is not uncommon for this period of the patient's cancer care trajectory to be one of many emotions and stressors.  We discussed an opportunity for her to participate in the next session of [facility] ("Finding Your New Normal") support group series designed for patients after they have completed  treatment.   Ms. Colclasure was encouraged to take advantage of our many other support services programs, support groups, and/or counseling in coping with her new life as a cancer survivor after completing anti-cancer treatment.  She was offered support today through active listening and expressive supportive counseling.  She was given information regarding our available services and encouraged to contact me with any questions or for help enrolling in any of our support group/programs.    Dispo:   -Return to cancer center ***  -Mammogram due in *** -Follow up with surgery *** -She is welcome to return back to the Survivorship Clinic at any time; no additional follow-up needed at this time.  -Consider referral back to survivorship as a long-term survivor for continued surveillance  A total of (30)  minutes of face-to-face time was spent with this patient with greater than 50% of that time in counseling and care-coordination.  Santiago Glad, NP Survivorship Program [facility] [phone]   Note: PRIMARY CARE PROVIDER Merri Brunette, MD [phone]

## 2022-12-16 ENCOUNTER — Encounter: Payer: Self-pay | Admitting: Nurse Practitioner

## 2022-12-16 ENCOUNTER — Inpatient Hospital Stay: Payer: BC Managed Care – PPO

## 2022-12-16 ENCOUNTER — Inpatient Hospital Stay: Payer: BC Managed Care – PPO | Attending: Nurse Practitioner | Admitting: Nurse Practitioner

## 2022-12-16 ENCOUNTER — Other Ambulatory Visit: Payer: Self-pay

## 2022-12-16 VITALS — BP 147/100 | HR 88 | Temp 98.7°F | Resp 16 | Wt 211.5 lb

## 2022-12-16 DIAGNOSIS — Z170 Estrogen receptor positive status [ER+]: Secondary | ICD-10-CM

## 2022-12-16 DIAGNOSIS — C50411 Malignant neoplasm of upper-outer quadrant of right female breast: Secondary | ICD-10-CM

## 2022-12-16 DIAGNOSIS — Z79899 Other long term (current) drug therapy: Secondary | ICD-10-CM | POA: Diagnosis not present

## 2022-12-16 DIAGNOSIS — Z923 Personal history of irradiation: Secondary | ICD-10-CM | POA: Insufficient documentation

## 2022-12-16 DIAGNOSIS — N939 Abnormal uterine and vaginal bleeding, unspecified: Secondary | ICD-10-CM | POA: Insufficient documentation

## 2022-12-16 DIAGNOSIS — Z853 Personal history of malignant neoplasm of breast: Secondary | ICD-10-CM | POA: Diagnosis not present

## 2022-12-16 DIAGNOSIS — N859 Noninflammatory disorder of uterus, unspecified: Secondary | ICD-10-CM | POA: Diagnosis not present

## 2022-12-16 LAB — CMP (CANCER CENTER ONLY)
ALT: 19 U/L (ref 0–44)
AST: 10 U/L — ABNORMAL LOW (ref 15–41)
Albumin: 4.1 g/dL (ref 3.5–5.0)
Alkaline Phosphatase: 78 U/L (ref 38–126)
Anion gap: 6 (ref 5–15)
BUN: 8 mg/dL (ref 6–20)
CO2: 30 mmol/L (ref 22–32)
Calcium: 9.4 mg/dL (ref 8.9–10.3)
Chloride: 105 mmol/L (ref 98–111)
Creatinine: 0.74 mg/dL (ref 0.44–1.00)
GFR, Estimated: >60 mL/min (ref >60)
Glucose, Bld: 110 mg/dL — ABNORMAL HIGH (ref 70–99)
Potassium: 3.9 mmol/L (ref 3.5–5.1)
Sodium: 141 mmol/L (ref 135–145)
Total Bilirubin: 0.8 mg/dL (ref 0.3–1.2)
Total Protein: 6.8 g/dL (ref 6.5–8.1)

## 2022-12-16 LAB — CBC WITH DIFFERENTIAL (CANCER CENTER ONLY)
Abs Immature Granulocytes: 0.06 10*3/uL (ref 0.00–0.07)
Basophils Absolute: 0 10*3/uL (ref 0.0–0.1)
Basophils Relative: 1 %
Eosinophils Absolute: 0.2 10*3/uL (ref 0.0–0.5)
Eosinophils Relative: 2 %
HCT: 35.1 % — ABNORMAL LOW (ref 36.0–46.0)
Hemoglobin: 11.9 g/dL — ABNORMAL LOW (ref 12.0–15.0)
Immature Granulocytes: 1 %
Lymphocytes Relative: 27 %
Lymphs Abs: 2.3 10*3/uL (ref 0.7–4.0)
MCH: 33 pg (ref 26.0–34.0)
MCHC: 33.9 g/dL (ref 30.0–36.0)
MCV: 97.2 fL (ref 80.0–100.0)
Monocytes Absolute: 0.7 10*3/uL (ref 0.1–1.0)
Monocytes Relative: 8 %
Neutro Abs: 5.2 10*3/uL (ref 1.7–7.7)
Neutrophils Relative %: 61 %
Platelet Count: 341 10*3/uL (ref 150–400)
RBC: 3.61 MIL/uL — ABNORMAL LOW (ref 3.87–5.11)
RDW: 12.5 % (ref 11.5–15.5)
WBC Count: 8.5 10*3/uL (ref 4.0–10.5)
nRBC: 0 % (ref 0.0–0.2)

## 2022-12-18 LAB — FOLLICLE STIMULATING HORMONE: FSH: 25.2 m[IU]/mL

## 2022-12-22 ENCOUNTER — Ambulatory Visit (INDEPENDENT_AMBULATORY_CARE_PROVIDER_SITE_OTHER): Payer: BC Managed Care – PPO | Admitting: Podiatry

## 2022-12-22 DIAGNOSIS — Z91199 Patient's noncompliance with other medical treatment and regimen due to unspecified reason: Secondary | ICD-10-CM

## 2022-12-22 NOTE — Progress Notes (Signed)
No show

## 2022-12-30 ENCOUNTER — Other Ambulatory Visit: Payer: Self-pay | Admitting: Obstetrics and Gynecology

## 2023-01-01 ENCOUNTER — Other Ambulatory Visit: Payer: Self-pay

## 2023-01-01 ENCOUNTER — Encounter (HOSPITAL_BASED_OUTPATIENT_CLINIC_OR_DEPARTMENT_OTHER): Payer: Self-pay | Admitting: Obstetrics and Gynecology

## 2023-01-01 NOTE — Progress Notes (Signed)
Spoke w/ via phone for pre-op interview---pt Lab needs dos----  cbc, I stat, ekg             Lab results------none COVID test -----patient states asymptomatic no test needed Arrive at -------815 01-09-2023 NPO after MN NO Solid Food.  Clear liquids from MN until---715 am Med rec completed Medications to take morning of surgery -----proair inhaler prn/bring inhaler Diabetic medication -----last dose ozempic was 12-21-2022 Patient instructed no nail polish to be worn day of surgery Patient instructed to bring photo id and insurance card day of surgery Patient aware to have Driver (ride ) / caregiver   husband Carla Patrick  for 24 hours after surgery  Patient Special Instructions -----none Pre-Op special Instructions -----none Patient verbalized understanding of instructions that were given at this phone interview. Patient denies shortness of breath, chest pain, fever, cough at this phone interview.  [facility] cardiology dr branch (worked up for palpitations  10-17-2021, f/u prn)

## 2023-01-05 ENCOUNTER — Encounter: Payer: Self-pay | Admitting: Nurse Practitioner

## 2023-01-07 ENCOUNTER — Other Ambulatory Visit (HOSPITAL_COMMUNITY): Payer: Self-pay

## 2023-01-07 MED ORDER — ROSUVASTATIN CALCIUM 20 MG PO TABS
20.0000 mg | ORAL_TABLET | Freq: Every day | ORAL | 3 refills | Status: AC
  Filled 2023-01-07: qty 90, 90d supply, fill #0

## 2023-01-07 MED ORDER — OZEMPIC (0.25 OR 0.5 MG/DOSE) 2 MG/3ML ~~LOC~~ SOPN
0.5000 mg | PEN_INJECTOR | SUBCUTANEOUS | 1 refills | Status: DC
  Filled 2023-01-07: qty 3, 28d supply, fill #0

## 2023-01-09 ENCOUNTER — Encounter (HOSPITAL_BASED_OUTPATIENT_CLINIC_OR_DEPARTMENT_OTHER): Payer: Self-pay | Admitting: Obstetrics and Gynecology

## 2023-01-09 ENCOUNTER — Ambulatory Visit (HOSPITAL_BASED_OUTPATIENT_CLINIC_OR_DEPARTMENT_OTHER): Payer: BC Managed Care – PPO | Admitting: Anesthesiology

## 2023-01-09 ENCOUNTER — Ambulatory Visit (HOSPITAL_BASED_OUTPATIENT_CLINIC_OR_DEPARTMENT_OTHER)
Admission: RE | Admit: 2023-01-09 | Discharge: 2023-01-09 | Disposition: A | Discharge status: Home / Self Care | Payer: BC Managed Care – PPO | Source: Home / Self Care | Attending: Obstetrics and Gynecology | Admitting: Obstetrics and Gynecology

## 2023-01-09 ENCOUNTER — Encounter (HOSPITAL_BASED_OUTPATIENT_CLINIC_OR_DEPARTMENT_OTHER)
Admission: RE | Disposition: A | Discharge status: Home / Self Care | Payer: Self-pay | Source: Home / Self Care | Attending: Obstetrics and Gynecology

## 2023-01-09 ENCOUNTER — Other Ambulatory Visit: Payer: Self-pay

## 2023-01-09 DIAGNOSIS — N840 Polyp of corpus uteri: Secondary | ICD-10-CM | POA: Diagnosis not present

## 2023-01-09 DIAGNOSIS — Z923 Personal history of irradiation: Secondary | ICD-10-CM | POA: Diagnosis not present

## 2023-01-09 DIAGNOSIS — N950 Postmenopausal bleeding: Secondary | ICD-10-CM | POA: Insufficient documentation

## 2023-01-09 DIAGNOSIS — J45909 Unspecified asthma, uncomplicated: Secondary | ICD-10-CM | POA: Diagnosis not present

## 2023-01-09 DIAGNOSIS — Z01818 Encounter for other preprocedural examination: Secondary | ICD-10-CM

## 2023-01-09 DIAGNOSIS — I10 Essential (primary) hypertension: Secondary | ICD-10-CM | POA: Diagnosis not present

## 2023-01-09 DIAGNOSIS — Z853 Personal history of malignant neoplasm of breast: Secondary | ICD-10-CM | POA: Diagnosis not present

## 2023-01-09 HISTORY — DX: Presence of spectacles and contact lenses: Z97.3

## 2023-01-09 HISTORY — PX: DILATATION & CURETTAGE/HYSTEROSCOPY WITH MYOSURE: SHX6511

## 2023-01-09 HISTORY — DX: Postmenopausal bleeding: N95.0

## 2023-01-09 HISTORY — DX: Prediabetes: R73.03

## 2023-01-09 LAB — POCT I-STAT, CHEM 8
BUN: 14 mg/dL (ref 6–20)
Calcium, Ion: 1.12 mmol/L — ABNORMAL LOW (ref 1.15–1.40)
Chloride: 104 mmol/L (ref 98–111)
Creatinine, Ser: 0.7 mg/dL (ref 0.44–1.00)
Glucose, Bld: 137 mg/dL — ABNORMAL HIGH (ref 70–99)
HCT: 30 % — ABNORMAL LOW (ref 36.0–46.0)
Hemoglobin: 10.2 g/dL — ABNORMAL LOW (ref 12.0–15.0)
Potassium: 3.8 mmol/L (ref 3.5–5.1)
Sodium: 139 mmol/L (ref 135–145)
TCO2: 23 mmol/L (ref 22–32)

## 2023-01-09 LAB — CBC
HCT: 29.7 % — ABNORMAL LOW (ref 36.0–46.0)
Hemoglobin: 9.4 g/dL — ABNORMAL LOW (ref 12.0–15.0)
MCH: 31.4 pg (ref 26.0–34.0)
MCHC: 31.6 g/dL (ref 30.0–36.0)
MCV: 99.3 fL (ref 80.0–100.0)
Platelets: 420 10*3/uL — ABNORMAL HIGH (ref 150–400)
RBC: 2.99 MIL/uL — ABNORMAL LOW (ref 3.87–5.11)
RDW: 13.3 % (ref 11.5–15.5)
WBC: 7.8 10*3/uL (ref 4.0–10.5)
nRBC: 0.3 % — ABNORMAL HIGH (ref 0.0–0.2)

## 2023-01-09 SURGERY — DILATATION & CURETTAGE/HYSTEROSCOPY WITH MYOSURE
Anesthesia: General | Site: Uterus

## 2023-01-09 MED ORDER — DEXAMETHASONE SODIUM PHOSPHATE 10 MG/ML IJ SOLN
INTRAMUSCULAR | Status: AC
  Filled 2023-01-09: qty 1

## 2023-01-09 MED ORDER — OXYCODONE HCL 5 MG PO TABS
ORAL_TABLET | ORAL | Status: AC
  Filled 2023-01-09: qty 1

## 2023-01-09 MED ORDER — LACTATED RINGERS IV SOLN: INTRAVENOUS | Status: DC

## 2023-01-09 MED ORDER — PHENYLEPHRINE 80 MCG/ML (10ML) SYRINGE FOR IV PUSH (FOR BLOOD PRESSURE SUPPORT)
PREFILLED_SYRINGE | INTRAVENOUS | Status: AC
  Filled 2023-01-09: qty 10

## 2023-01-09 MED ORDER — FENTANYL CITRATE (PF) 100 MCG/2ML IJ SOLN
INTRAMUSCULAR | Status: DC | PRN
  Administered 2023-01-09 (×4): 25 ug via INTRAVENOUS

## 2023-01-09 MED ORDER — ONDANSETRON HCL 4 MG/2ML IJ SOLN: 4.0000 mg | Freq: Once | INTRAMUSCULAR | Status: DC | PRN

## 2023-01-09 MED ORDER — OXYCODONE HCL 5 MG/5ML PO SOLN: 5.0000 mg | Freq: Once | ORAL | Status: AC | PRN

## 2023-01-09 MED ORDER — PROPOFOL 10 MG/ML IV BOLUS
INTRAVENOUS | Status: AC
  Filled 2023-01-09: qty 20

## 2023-01-09 MED ORDER — MIDAZOLAM HCL 2 MG/2ML IJ SOLN
INTRAMUSCULAR | Status: DC | PRN
  Administered 2023-01-09: 2 mg via INTRAVENOUS

## 2023-01-09 MED ORDER — ONDANSETRON HCL 4 MG/2ML IJ SOLN
INTRAMUSCULAR | Status: DC | PRN
  Administered 2023-01-09: 4 mg via INTRAVENOUS

## 2023-01-09 MED ORDER — MIDAZOLAM HCL 2 MG/2ML IJ SOLN
INTRAMUSCULAR | Status: AC
  Filled 2023-01-09: qty 2

## 2023-01-09 MED ORDER — PROPOFOL 10 MG/ML IV BOLUS
INTRAVENOUS | Status: DC | PRN
Start: 2023-01-09 — End: 2023-01-09
  Administered 2023-01-09: 140 mg via INTRAVENOUS

## 2023-01-09 MED ORDER — KETOROLAC TROMETHAMINE 30 MG/ML IJ SOLN
INTRAMUSCULAR | Status: DC | PRN
Start: 2023-01-09 — End: 2023-01-09
  Administered 2023-01-09: 30 mg via INTRAVENOUS

## 2023-01-09 MED ORDER — ONDANSETRON HCL 4 MG/2ML IJ SOLN
INTRAMUSCULAR | Status: AC
  Filled 2023-01-09: qty 2

## 2023-01-09 MED ORDER — IBUPROFEN 800 MG PO TABS: 800.0000 mg | ORAL_TABLET | Freq: Three times a day (TID) | ORAL | 11 refills | Status: DC | PRN

## 2023-01-09 MED ORDER — POVIDONE-IODINE 10 % EX SWAB: 2.0000 | Freq: Once | CUTANEOUS | Status: DC

## 2023-01-09 MED ORDER — SODIUM CHLORIDE 0.9 % IR SOLN
Status: DC | PRN
  Administered 2023-01-09: 3000 mL

## 2023-01-09 MED ORDER — ACETAMINOPHEN 10 MG/ML IV SOLN: 1000.0000 mg | Freq: Once | INTRAVENOUS | Status: DC | PRN

## 2023-01-09 MED ORDER — PHENYLEPHRINE 80 MCG/ML (10ML) SYRINGE FOR IV PUSH (FOR BLOOD PRESSURE SUPPORT)
PREFILLED_SYRINGE | INTRAVENOUS | Status: DC | PRN
  Administered 2023-01-09: 80 ug via INTRAVENOUS
  Administered 2023-01-09: 160 ug via INTRAVENOUS

## 2023-01-09 MED ORDER — KETOROLAC TROMETHAMINE 30 MG/ML IJ SOLN
INTRAMUSCULAR | Status: AC
  Filled 2023-01-09: qty 1

## 2023-01-09 MED ORDER — OXYCODONE HCL 5 MG PO TABS
5.0000 mg | ORAL_TABLET | Freq: Once | ORAL | Status: AC | PRN
  Administered 2023-01-09: 5 mg via ORAL

## 2023-01-09 MED ORDER — DEXAMETHASONE SODIUM PHOSPHATE 10 MG/ML IJ SOLN
INTRAMUSCULAR | Status: DC | PRN
  Administered 2023-01-09: 10 mg via INTRAVENOUS

## 2023-01-09 MED ORDER — LIDOCAINE 2% (20 MG/ML) 5 ML SYRINGE
INTRAMUSCULAR | Status: DC | PRN
  Administered 2023-01-09: 60 mg via INTRAVENOUS

## 2023-01-09 MED ORDER — LIDOCAINE HCL (PF) 2 % IJ SOLN
INTRAMUSCULAR | Status: AC
  Filled 2023-01-09: qty 5

## 2023-01-09 MED ORDER — FENTANYL CITRATE (PF) 100 MCG/2ML IJ SOLN: 25.0000 ug | INTRAMUSCULAR | Status: DC | PRN

## 2023-01-09 MED ORDER — FENTANYL CITRATE (PF) 100 MCG/2ML IJ SOLN
INTRAMUSCULAR | Status: AC
  Filled 2023-01-09: qty 2

## 2023-01-09 SURGICAL SUPPLY — 22 items
CATH ROBINSON RED A/P 16FR (CATHETERS) IMPLANT
DEVICE MYOSURE LITE (MISCELLANEOUS) IMPLANT
DEVICE MYOSURE REACH (MISCELLANEOUS) IMPLANT
DILATOR CANAL MILEX (MISCELLANEOUS) IMPLANT
DRSG TELFA 3X8 NADH STRL (GAUZE/BANDAGES/DRESSINGS) ×1 IMPLANT
GAUZE 4X4 16PLY ~~LOC~~+RFID DBL (SPONGE) ×1 IMPLANT
GLOVE BIOGEL PI IND STRL 7.0 (GLOVE) ×1 IMPLANT
GLOVE ECLIPSE 6.5 STRL STRAW (GLOVE) ×1 IMPLANT
GOWN STRL REUS W/TWL LRG LVL3 (GOWN DISPOSABLE) ×1 IMPLANT
IV NS IRRIG 3000ML ARTHROMATIC (IV SOLUTION) ×1 IMPLANT
KIT PROCEDURE FLUENT (KITS) ×1 IMPLANT
KIT TURNOVER CYSTO (KITS) ×1 IMPLANT
MYOSURE XL FIBROID (MISCELLANEOUS)
PACK VAGINAL MINOR WOMEN LF (CUSTOM PROCEDURE TRAY) ×1 IMPLANT
PAD OB MATERNITY 4.3X12.25 (PERSONAL CARE ITEMS) ×1 IMPLANT
PAD PREP 24X48 CUFFED NSTRL (MISCELLANEOUS) ×1 IMPLANT
SEAL CERVICAL OMNI LOK (ABLATOR) IMPLANT
SEAL ROD LENS SCOPE MYOSURE (ABLATOR) ×1 IMPLANT
SLEEVE SCD COMPRESS KNEE MED (STOCKING) ×1 IMPLANT
SYSTEM TISS REMOVAL MYOSURE XL (MISCELLANEOUS) IMPLANT
TOWEL OR 17X24 6PK STRL BLUE (TOWEL DISPOSABLE) ×1 IMPLANT
WATER STERILE IRR 500ML POUR (IV SOLUTION) ×1 IMPLANT

## 2023-01-09 NOTE — H&P (Signed)
Carla Patrick is an 57 y.o. female. BF presents for surgical mgmt of endometrial mass noted on sonohysterogram done for PMB. Pt is scheduled for dx hysteroscopy, D&C, hysteroscopic resection of endometrial mass  Pertinent Gynecological History: Menses: post-menopausal Bleeding: post menopausal bleeding Contraception: none DES exposure: denies Blood transfusions: none Sexually transmitted diseases: no past history Previous GYN Procedures:  n/a   Last mammogram: normal Date: 2024 Last pap: normal Date: 2024    Menstrual History: Menarche age: n/a No LMP recorded. (Menstrual status: Irregular Periods).    Past Medical History:  Diagnosis Date   Abnormal EKG    Anemia    Asthma    Cancer (HCC) 11/2020   right breast [facility]   Depression    Heart murmur    mild no cardiologist   History of radiation therapy    Right Breast- 03/18/21-04/15/21-Dr. Antony Blackbird   Hyperlipidemia    Hypertension    PMB (postmenopausal bleeding)    Pre-diabetes    Wears glasses     Past Surgical History:  Procedure Laterality Date   BREAST EXCISIONAL BIOPSY  2007   left   BREAST LUMPECTOMY WITH RADIOACTIVE SEED AND SENTINEL LYMPH NODE BIOPSY Right 01/30/2021   Procedure: RIGHT BREAST LUMPECTOMY WITH RADIOACTIVE SEED AND SENTINEL LYMPH NODE BIOPSY;  Surgeon: Griselda Miner, MD;  Location: [facility];  Service: General;  Laterality: Right;   CHOLECYSTECTOMY  10/04/2011   Procedure: LAPAROSCOPIC CHOLECYSTECTOMY WITH INTRAOPERATIVE CHOLANGIOGRAM;  Surgeon: Emelia Loron, MD;  Location: [facility];  Service: General;  Laterality: N/A;    Family History  Problem Relation Age of Onset   HIV Father    Heart disease Maternal Grandmother    Diabetes Maternal Grandfather    Diabetes Paternal Grandmother     Social History:  reports that she has never smoked. She has never used smokeless tobacco. She reports current alcohol use. She reports that she does not use drugs.  Allergies: No  Known Allergies  No medications prior to admission.    Review of Systems  All other systems reviewed and are negative.   Height 4\' 11"  (1.499 m), weight 95.3 kg. Physical Exam Constitutional:      Appearance: Normal appearance. She is obese.  Eyes:     Extraocular Movements: Extraocular movements intact.  Cardiovascular:     Rate and Rhythm: Regular rhythm.     Heart sounds: Normal heart sounds.  Pulmonary:     Breath sounds: Normal breath sounds.  Abdominal:     Palpations: Abdomen is soft.  Genitourinary:    General: Normal vulva.     Comments: Vagina nl Cervix parous Uterus AV bulky Adnexa non palpable Musculoskeletal:        General: Normal range of motion.     Cervical back: Neck supple.  Skin:    General: Skin is warm and dry.  Neurological:     General: No focal deficit present.     Mental Status: She is alert and oriented to person, place, and time.  Psychiatric:        Mood and Affect: Mood normal.        Behavior: Behavior normal.     No results found for this or any previous visit (from the past 24 hour(s)).  No results found.  Assessment/Plan: PMB Endometrial mass Hx breast cancer P) dx hysteroscopy, D&C, hysteroscopic resection of endometrial mass using myosure. Procedure explained. Risk of surgery including infection, bleeding, injury to surrounding organ, thermal injury, fluid overload and its  mgmt, uterine perforation( 06/998) and its risk. All ? answered   A  01/09/2023, 4:29 AM

## 2023-01-09 NOTE — Brief Op Note (Signed)
01/09/2023  11:20 AM  PATIENT:  Carla Patrick  57 y.o. female  PRE-OPERATIVE DIAGNOSIS:  Post Menopausal bleeding, ENDOMETRIAL MASS  POST-OPERATIVE DIAGNOSIS:  Post Menopausal bleeding, ENDOMETRIAL POLYPS  PROCEDURE:  DIAGNOSTIC HYSTEROSCOPY, HYSTEROSCOPIC RESECTION OF ENDOMETRIAL POLYPS USING MYOSURE, d&c  SURGEON:  Surgeons and Role:    * Maxie Better, MD - Primary  PHYSICIAN ASSISTANT:   ASSISTANTS: none   ANESTHESIA:   general fINDINGS:  large LUS endometrial polyp, small anterior polyp, tubal ostia seen EBL:  2 mL   BLOOD ADMINISTERED:none  DRAINS: none   LOCAL MEDICATIONS USED:  NONE  SPECIMEN:  Source of Specimen:  EMC w/ polyp  DISPOSITION OF SPECIMEN:  PATHOLOGY  COUNTS:  YES  TOURNIQUET:  * No tourniquets in log *  DICTATION: .Other Dictation: Dictation Number [id number]  PLAN OF CARE: Discharge to home after PACU  PATIENT DISPOSITION:  PACU - hemodynamically stable.   Delay start of Pharmacological VTE agent (>24hrs) due to surgical blood loss or risk of bleeding: yes

## 2023-01-09 NOTE — Anesthesia Postprocedure Evaluation (Signed)
Anesthesia Post Note  Patient: Carla Patrick  Procedure(s) Performed: DILATATION & CURETTAGE/HYSTEROSCOPY WITH MYOSURE (Uterus)     Patient location during evaluation: PACU Anesthesia Type: General Level of consciousness: awake and alert Pain management: pain level controlled Vital Signs Assessment: post-procedure vital signs reviewed and stable Respiratory status: spontaneous breathing, nonlabored ventilation, respiratory function stable and patient connected to nasal cannula oxygen Cardiovascular status: blood pressure returned to baseline and stable Postop Assessment: no apparent nausea or vomiting Anesthetic complications: no   No notable events documented.  Last Vitals:  Vitals:   01/09/23 1203 01/09/23 1215  BP: (!) 133/94 (!) 154/88  Pulse: 81   Resp: (!) 22 18  Temp:    SpO2: 99% 99%    Last Pain:  Vitals:   01/09/23 1215  TempSrc:   PainSc: 2                  Mariann Barter

## 2023-01-09 NOTE — Op Note (Signed)
NAME: Carla Patrick, Carla Patrick MEDICAL RECORD NO: [id number] ACCOUNT NO: [id number] DATE OF BIRTH: 1965/07/17 FACILITY: [facility] LOCATION: [facility] PHYSICIAN:  A. Cherly Hensen, MD  Operative Report   DATE OF PROCEDURE: 01/09/2023  PREOPERATIVE DIAGNOSES:  Postmenopausal bleeding, endometrial mass.  PROCEDURE:  Diagnostic hysteroscopy, hysteroscopic resection of endometrial polyp using MyoSure, dilation and curettage.  POSTOPERATIVE DIAGNOSES:  Postmenopausal bleeding, endometrial polyp.  ANESTHESIA:  General.  SURGEON:   A. Cherly Hensen, MD.  ASSISTANT:  None.  DESCRIPTION OF PROCEDURE:  Under adequate general anesthesia, the patient was placed in the dorsal lithotomy position.  She was sterilely prepped and draped in the usual fashion.  The patient had voided prior to entering the room and therefore she was  not catheterized.  Bivalve speculum was placed in the vagina.  A single-tooth tenaculum was placed on the anterior lip of the cervix.  Cervix easily accepted a #17 Pratt dilator.  A diagnostic hysteroscope was introduced into the uterine cavity. At the  entrance of the lower uterine segment there was a large polypoid lesion, which blocked the view of the upper part of the uterine cavity. Using the Reach resectoscope, that endometrial polyp was resected at which point, another polyp was noted further up  anteriorly.  This was also resected.  Both tubal ostia could be seen.  The endometrial cavity was entirely resected.  When all tissue was felt to have been removed, all instruments were then removed from the vagina.  Endocervical canal was inspected and  no lesions noted.  SPECIMEN:  Endometrial curettings with polyp, sent to pathology.  FLUID DEFICIT:  515 mL.  ESTIMATED BLOOD LOSS:  2 mL.  COMPLICATIONS:  None.  The patient tolerated the procedure well, was transferred to the recovery room in stable condition.   PUS D: 01/09/2023 11:53:57 am T: 01/09/2023 11:59:00 am   JOB: [id number]/ [id number]

## 2023-01-09 NOTE — Transfer of Care (Signed)
Immediate Anesthesia Transfer of Care Note  Patient: Carla Patrick  Procedure(s) Performed: Procedure(s) (LRB): DILATATION & CURETTAGE/HYSTEROSCOPY WITH MYOSURE (N/A)  Patient Location: PACU  Anesthesia Type: General  Level of Consciousness: awake, sedated, patient cooperative and responds to stimulation  Airway & Oxygen Therapy: Patient Spontanous Breathing and Patient connected to [place] oxygen  Post-op Assessment: Report given to PACU RN, Post -op Vital signs reviewed and stable and Patient moving all extremities  Post vital signs: Reviewed and stable  Complications: No apparent anesthesia complications

## 2023-01-09 NOTE — Anesthesia Preprocedure Evaluation (Signed)
Anesthesia Evaluation   Patient awake    Reviewed: Allergy & Precautions, NPO status , Patient's Chart, lab work & pertinent test results, reviewed documented beta blocker date and time   History of Anesthesia Complications Negative for: history of anesthetic complications  Airway Mallampati: III  TM Distance: >3 FB     Dental no notable dental hx.    Pulmonary asthma , neg sleep apnea, neg COPD, neg PE   breath sounds clear to auscultation       Cardiovascular hypertension, (-) angina (-) CAD, (-) Past MI and (-) Cardiac Stents + dysrhythmias (palpitations - currently undergoing w/u) (-) Cardiac Defibrillator + Valvular Problems/Murmurs  Rhythm:Regular Rate:Normal     Neuro/Psych neg Seizures PSYCHIATRIC DISORDERS  Depression       GI/Hepatic ,neg GERD  ,,(+) neg Cirrhosis        Endo/Other    Renal/GU Renal disease     Musculoskeletal   Abdominal   Peds  Hematology  (+) Blood dyscrasia, anemia   Anesthesia Other Findings   Reproductive/Obstetrics                              Anesthesia Physical Anesthesia Plan  ASA: 2  Anesthesia Plan: General   Post-op Pain Management:    Induction: Intravenous  PONV Risk Score and Plan:   Airway Management Planned: LMA  Additional Equipment:   Intra-op Plan:   Post-operative Plan: Extubation in OR  Informed Consent: I have reviewed the patients History and Physical, chart, labs and discussed the procedure including the risks, benefits and alternatives for the proposed anesthesia with the patient or authorized representative who has indicated his/her understanding and acceptance.     Dental advisory given  Plan Discussed with: CRNA  Anesthesia Plan Comments:          Anesthesia Quick Evaluation

## 2023-01-09 NOTE — Discharge Instructions (Addendum)
CALL  IF TEMP>100.4, NOTHING PER VAGINA X1 WK, CALL IF SOAKING A MAXI  PAD EVERY HOUR OR MORE FREQUENTLY     Post Anesthesia Home Care Instructions  Activity: Get plenty of rest for the remainder of the day. A responsible individual must stay with you for 24 hours following the procedure.  For the next 24 hours, DO NOT: -Drive a car -Paediatric nurse -Drink alcoholic beverages -Take any medication unless instructed by your physician -Make any legal decisions or sign important papers.  Meals: Start with liquid foods such as gelatin or soup. Progress to regular foods as tolerated. Avoid greasy, spicy, heavy foods. If nausea and/or vomiting occur, drink only clear liquids until the nausea and/or vomiting subsides. Call your physician if vomiting continues.  Special Instructions/Symptoms: Your throat may feel dry or sore from the anesthesia or the breathing tube placed in your throat during surgery. If this causes discomfort, gargle with warm salt water. The discomfort should disappear within 24 hours.

## 2023-01-09 NOTE — Anesthesia Procedure Notes (Signed)
Procedure Name: LMA Insertion Date/Time: 01/09/2023 10:48 AM  Performed by: Francie Massing, CRNAPre-anesthesia Checklist: Patient identified, Emergency Drugs available, Suction available and Patient being monitored Patient Re-evaluated:Patient Re-evaluated prior to induction Oxygen Delivery Method: Circle system utilized Preoxygenation: Pre-oxygenation with 100% oxygen Induction Type: IV induction Ventilation: Mask ventilation without difficulty LMA: LMA inserted LMA Size: 4.0 Number of attempts: 1 Airway Equipment and Method: Bite block Placement Confirmation: positive ETCO2 Tube secured with: Tape Dental Injury: Teeth and Oropharynx as per pre-operative assessment

## 2023-01-09 NOTE — Interval H&P Note (Signed)
History and Physical Interval Note:  01/09/2023 10:40 AM  Carla Patrick  has presented today for surgery, with the diagnosis of Post Menopausal bleeding.  The various methods of treatment have been discussed with the patient and family. After consideration of risks, benefits and other options for treatment, the patient has consented to  Diagnostic hysteroscopy, hysteroscopic resection of endometrial mass using myosure, dilation and curettage as a surgical intervention.  The patient's history has been reviewed, patient examined, no change in status, stable for surgery.  I have reviewed the patient's chart and labs.  Questions were answered to the patient's satisfaction.      A 

## 2023-01-12 ENCOUNTER — Encounter (HOSPITAL_BASED_OUTPATIENT_CLINIC_OR_DEPARTMENT_OTHER): Payer: Self-pay | Admitting: Obstetrics and Gynecology

## 2023-01-19 ENCOUNTER — Other Ambulatory Visit (HOSPITAL_COMMUNITY): Payer: Self-pay

## 2023-01-20 ENCOUNTER — Other Ambulatory Visit (HOSPITAL_COMMUNITY): Payer: Self-pay

## 2023-01-28 ENCOUNTER — Other Ambulatory Visit: Payer: BC Managed Care – PPO

## 2023-02-08 NOTE — Progress Notes (Unsigned)
Patient Care Team: Merri Brunette, MD as PCP - General (Internal Medicine) Pershing Proud, RN as Oncology Nurse Navigator Donnelly Angelica, RN as Oncology Nurse Navigator Griselda Miner, MD as Consulting Physician (General Surgery) Malachy Mood, MD as Consulting Physician (Hematology) Antony Blackbird, MD as Consulting Physician (Radiation Oncology) Pollyann Samples, NP as Nurse Practitioner (Nurse Practitioner)   CHIEF COMPLAINT: Follow up right breast cancer   Oncology History Overview Note   Cancer Staging  Malignant neoplasm of upper-outer quadrant of right breast in female, estrogen receptor positive (HCC) Staging form: Breast, AJCC 8th Edition - Clinical stage from 12/26/2020: Stage IA (cT1b, cN0, cM0, G2, ER+, PR-, HER2-) - Unsigned Stage prefix: Initial diagnosis Histologic grading system: 3 grade system Staged by: Pathologist and managing physician Stage used in treatment planning: Yes National guidelines used in treatment planning: Yes Type of national guideline used in treatment planning: NCCN - Pathologic stage from 01/12/2021: Stage IA (pT1c, pN0(i+), cM0, G2, ER+, PR-, HER2-, Oncotype DX score: 22) - Signed by Malachy Mood, MD on 04/27/2021 Stage prefix: Initial diagnosis Multigene prognostic tests performed: Oncotype DX Recurrence score range: Greater than or equal to 11 Histologic grading system: 3 grade system Residual tumor (R): R0 - None    Malignant neoplasm of upper-outer quadrant of right breast in female, estrogen receptor positive (HCC)  12/05/2020 Mammogram   Right Breast Diagnostic Mammogram; Right Breast Ultrasound  IMPRESSION: The 0.4 x 0.5 x 0.7 cm taller-than-wide irregular mass in the right breast is suspicious of malignancy. No significant abnormalities were seen sonographically in the right axilla.   12/17/2020 Pathology Results   Diagnosis:  Breast, right, needle core biopsy, 12 O'CLOCK middle depth, 9 cmfn - INVASIVE MAMMARY CARCINOMA - MAMMARY  CARCINOMA IN SITU  The carcinoma appears Nottingham grade 2 of 3. E-cadherin is negative in the invasive carcinoma, supporting lobular origin.  PROGNOSTIC INDICATORS Results: IMMUNOHISTOCHEMICAL AND MORPHOMETRIC ANALYSIS PERFORMED MANUALLY The tumor cells are EQUIVOCAL for Her2 (2+). Her2 by FISH will be performed and results reported separately. Estrogen Receptor: 90%, POSITIVE, MODERATE STAINING INTENSITY Progesterone Receptor: 0%, NEGATIVE Proliferation Marker Ki67: 1%  FLUORESCENCE IN-SITU HYBRIDIZATION Results: GROUP 5: HER 2 **NEGATIVE** Equivocal form of amplification of the HER2 gene was detected in the IHC 2+ tissue sample received from this individual. HER2 FISH was performed by a technologist and cell imaging and analysis on the [employer]. RATIO OF ER2/CEN17 SIGNALS 1.15 AVERAGE HER2 COPY NUMBER PER CELL 1.95   12/24/2020 Initial Diagnosis   Malignant neoplasm of upper-outer quadrant of right breast in female, estrogen receptor positive (HCC)   01/12/2021 Cancer Staging   Staging form: Breast, AJCC 8th Edition - Pathologic stage from 01/12/2021: Stage IA (pT1c, pN0(i+), cM0, G2, ER+, PR-, HER2-, Oncotype DX score: 22) - Signed by Malachy Mood, MD on 04/27/2021 Stage prefix: Initial diagnosis Multigene prognostic tests performed: Oncotype DX Recurrence score range: Greater than or equal to 11 Histologic grading system: 3 grade system Residual tumor (R): R0 - None   01/30/2021 Definitive Surgery   FINAL MICROSCOPIC DIAGNOSIS:   A. LYMPH NODE, RIGHT AXILLARY #1, SENTINEL, EXCISION:  -  Isolated tumor cells identified in one lymph node (0i/1)  -  See comment   B. LYMPH NODE, RIGHT AXILLARY, SENTINEL, EXCISION:  -  No carcinoma identified in one lymph node (0/1)  -  See comment   C. LYMPH NODE, RIGHT AXILLARY #2, SENTINEL, EXCISION:  -  No carcinoma identified in one lymph node (0/1)  -  See  comment   D. LYMPH NODE, RIGHT AXILLARY, SENTINEL, EXCISION:  -  No carcinoma  identified in one lymph node (0/1)  -  See comment   E. LYMPH NODE, RIGHT AXILLARY #3, SENTINEL, EXCISION:  -  No carcinoma identified in one lymph node (0/1)  -  See comment   F. LYMPH NODE, RIGHT AXILLARY #4, SENTINEL, EXCISION:  -  No carcinoma identified in one lymph node (0/1)  -  See comment   G. LYMPH NODE, RIGHT AXILLARY, SENTINEL, EXCISION:  -  No carcinoma identified in one lymph node (0/1)  -  See comment   H. LYMPH NODE, RIGHT AXILLARY #5, SENTINEL, EXCISION:  -  Benign fibroadipose tissue  -  No lymphoid tissue identified   I. BREAST, RIGHT, LUMPECTOMY:  -  Invasive lobular carcinoma, Nottingham grade 2 of 3, 1.5 cm  -  Lobular neoplasia (atypical lobular hyperplasia)  -  Margins uninvolved by carcinoma (0.7 cm; inferior margin)  -  Previous biopsy site changes present  -  See oncology table and comment below    01/30/2021 Miscellaneous   Oncotype Recurrence Score of 22, risk of distant metastasis is 8% with antiestrogen therapy alone.   03/18/2021 - 04/15/2021 Radiation Therapy   Site Technique Total Dose (Gy) Dose per Fx (Gy) Completed Fx Beam Energies  Breast, Right: Breast_Rt 3D 40.05/40.05 2.67 15/15 10X  Breast, Right: Breast_Rt_Bst 3D 10/10 2 5/5 6X, 10X     05/2021 - 08/2022 Anti-estrogen oral therapy   Took low dose Tamoxifen intermittently, stopped 08/2022. Did not tolerate full dose due to SE's. After she stopped, pt developed abnormal vaginal bleeding.    12/16/2022 Survivorship   SCP delivered by Santiago Glad, NP      CURRENT THERAPY: Anti-estrogen therapy with Tamoxifen, stopped 08/2022, pending change to AI  INTERVAL HISTORY Carla Patrick returns for follow up as scheduled. Last seen by me in July for SCP. She was undergoing work up for vaginal bleeding. She underwent D&C with hysteroscopy, showing an endometrial polyp; path was benign. I confirmed she is post-menopausal with ob/gyn Dr. Cherly Hensen.  She has recovered well, but still tired.  This was her  main complaint with tamoxifen.  Bleeding has stopped.  She has moderate arthritic pain in her knees, but some days can be severe.  ROS  All other systems reviewed and negative  Past Medical History:  Diagnosis Date   Abnormal EKG    Acute sinusitis    Allergic rhinitis    Anemia    Asthma    Cancer (HCC) 11/2020   right breast [facility]   Depression    Elevated blood pressure reading    Heart murmur    mild no cardiologist   History of carpal tunnel release    History of radiation therapy    Right Breast- 03/18/21-04/15/21-Dr. Antony Blackbird   Hyperlipidemia    Hypertension    Morbid obesity due to excess calories (HCC)    Obese    Osteoarthritis    PMB (postmenopausal bleeding)    Pre-diabetes    Sciatica of left side    Sinus infection    Thrombocytosis    Wears glasses      Past Surgical History:  Procedure Laterality Date   BREAST EXCISIONAL BIOPSY  2007   left   BREAST LUMPECTOMY WITH RADIOACTIVE SEED AND SENTINEL LYMPH NODE BIOPSY Right 01/30/2021   Procedure: RIGHT BREAST LUMPECTOMY WITH RADIOACTIVE SEED AND SENTINEL LYMPH NODE BIOPSY;  Surgeon: Griselda Miner, MD;  Location:  [facility];  Service: General;  Laterality: Right;   CHOLECYSTECTOMY  10/04/2011   Procedure: LAPAROSCOPIC CHOLECYSTECTOMY WITH INTRAOPERATIVE CHOLANGIOGRAM;  Surgeon: Emelia Loron, MD;  Location: [facility];  Service: General;  Laterality: N/A;   DILATATION & CURETTAGE/HYSTEROSCOPY WITH MYOSURE N/A 01/09/2023   Procedure: DILATATION & CURETTAGE/HYSTEROSCOPY WITH MYOSURE;  Surgeon: Maxie Better, MD;  Location: [facility];  Service: Gynecology;  Laterality: N/A;     Outpatient Encounter Medications as of 02/12/2023  Medication Sig Note   exemestane (AROMASIN) 25 MG tablet Take 1 tablet (25 mg total) by mouth daily after breakfast.    ibuprofen (ADVIL) 800 MG tablet Take 1 tablet (800 mg total) by mouth every 8 (eight) hours as needed.    PROAIR HFA 108 (90 BASE)  MCG/ACT inhaler  11/08/2013: Received from: External Pharmacy   rosuvastatin (CRESTOR) 20 MG tablet Take 1 tablet (20 mg total) by mouth daily.    Semaglutide,0.25 or 0.5MG /DOS, (OZEMPIC, 0.25 OR 0.5 MG/DOSE,) 2 MG/3ML SOPN Inject 0.5 mg into the skin once a week.    SYMBICORT 160-4.5 MCG/ACT inhaler     telmisartan (MICARDIS) 40 MG tablet Take 1 tablet (40 mg total) by mouth daily.    No facility-administered encounter medications on file as of 02/12/2023.     Today's Vitals   02/12/23 0903  BP: 133/78  Pulse: 88  Resp: 18  Temp: 98.3 F (36.8 C)  TempSrc: Oral  SpO2: 100%  Weight: 209 lb 11.2 oz (95.1 kg)  Height: 4' 11.5" (1.511 m)   Body mass index is 41.65 kg/m.   PHYSICAL EXAM GENERAL:alert, no distress and comfortable SKIN: no rash  EYES: sclera clear LUNGS:  normal breathing effort NEURO: alert & oriented x 3 with fluent speech, no focal motor/sensory deficits Breast exam: Deferred   CBC    Component Value Date/Time   WBC 7.8 01/09/2023 0902   RBC 2.99 (L) 01/09/2023 0902   HGB 10.2 (L) 01/09/2023 0909   HGB 11.9 (L) 12/16/2022 0941   HGB 12.1 09/20/2021 1122   HCT 30.0 (L) 01/09/2023 0909   HCT 36.5 09/20/2021 1122   PLT 420 (H) 01/09/2023 0902   PLT 341 12/16/2022 0941   PLT 333 09/20/2021 1122   MCV 99.3 01/09/2023 0902   MCV 96 09/20/2021 1122   MCH 31.4 01/09/2023 0902   MCHC 31.6 01/09/2023 0902   RDW 13.3 01/09/2023 0902   RDW 12.5 09/20/2021 1122   LYMPHSABS 2.3 12/16/2022 0941   LYMPHSABS 1.7 09/20/2021 1122   MONOABS 0.7 12/16/2022 0941   EOSABS 0.2 12/16/2022 0941   EOSABS 0.1 09/20/2021 1122   BASOSABS 0.0 12/16/2022 0941   BASOSABS 0.0 09/20/2021 1122     CMP     Component Value Date/Time   NA 139 01/09/2023 0909   NA 144 09/20/2021 1122   K 3.8 01/09/2023 0909   CL 104 01/09/2023 0909   CO2 30 12/16/2022 0941   GLUCOSE 137 (H) 01/09/2023 0909   BUN 14 01/09/2023 0909   BUN 10 09/20/2021 1122   CREATININE 0.70 01/09/2023 0909    CREATININE 0.74 12/16/2022 0941   CALCIUM 9.4 12/16/2022 0941   PROT 6.8 12/16/2022 0941   PROT 6.7 09/20/2021 1122   ALBUMIN 4.1 12/16/2022 0941   ALBUMIN 4.4 09/20/2021 1122   AST 10 (L) 12/16/2022 0941   ALT 19 12/16/2022 0941   ALKPHOS 78 12/16/2022 0941   BILITOT 0.8 12/16/2022 0941   GFRNONAA >60 12/16/2022 0941   GFRAA >90  10/05/2011 0443     ASSESSMENT & PLAN: 57 year old postmenopausal female with   stage IA right breast invasive lobular carcinoma, ER+/PR-/HER2- [id number] -Diagnosed in 11/2020, s/p lumpectomy and adjuvant radiation therapy -She took some anti-estrogen therapy with Tamoxifen intermittently from 05/2021 - 08/2022; tolerated moderately well with fatigue but she stopped due to vaginal bleeding -I reviewed the case with ob/gyn Dr. Cherly Hensen, confirmed she is postmenopausal. S/p D&C/hysteroscopy which showed benign endometrial polyp -Ms. Elcock has recovered well, bleeding stopped. We discussed further anti-estrogen therapy.  -Due to her ER+ lobular histology and post-menopausal status, I recommend AI. Due to her baseline arthritis (knees), I recommend Aromasin. We reviewed potential SE's, she agrees to try -I reviewed recent labs, which shows moderate anemia while she had vaginal bleeding. I recommend to take prenatal vitamin with iron for now -Screening MRI 02/24/23 as scheduled -Phone f/up in 3 months for AI tox check, then f/up in 6 months, or sooner if needed     PLAN: -Gyn path and recent labs reviewed -Start Aromasin 25 mg po once daily and prenatal vitamin  -Screening breast MRI 02/24/23 as scheduled -Phone f/up in 3 months for AI tox check  -In person f/up 08/2023   All questions were answered. The patient knows to call the clinic with any problems, questions or concerns. No barriers to learning were detected. I spent 20 minutes counseling the patient face to face. The total time spent in the appointment was 30 minutes and more than 50% was on counseling,  review of test results, and coordination of care.   Santiago Glad, NP-C 02/12/2023

## 2023-02-12 ENCOUNTER — Encounter: Payer: Self-pay | Admitting: Nurse Practitioner

## 2023-02-12 ENCOUNTER — Inpatient Hospital Stay: Payer: BC Managed Care – PPO | Attending: Nurse Practitioner | Admitting: Nurse Practitioner

## 2023-02-12 ENCOUNTER — Ambulatory Visit (HOSPITAL_BASED_OUTPATIENT_CLINIC_OR_DEPARTMENT_OTHER): Payer: BC Managed Care – PPO | Admitting: Cardiovascular Disease

## 2023-02-12 ENCOUNTER — Encounter (HOSPITAL_BASED_OUTPATIENT_CLINIC_OR_DEPARTMENT_OTHER): Payer: Self-pay | Admitting: Cardiovascular Disease

## 2023-02-12 VITALS — BP 146/102 | HR 85 | Ht 59.5 in | Wt 210.7 lb

## 2023-02-12 VITALS — BP 133/78 | HR 88 | Temp 98.3°F | Resp 18 | Ht 59.5 in | Wt 209.7 lb

## 2023-02-12 DIAGNOSIS — I10 Essential (primary) hypertension: Secondary | ICD-10-CM

## 2023-02-12 DIAGNOSIS — C50411 Malignant neoplasm of upper-outer quadrant of right female breast: Secondary | ICD-10-CM | POA: Diagnosis not present

## 2023-02-12 DIAGNOSIS — R002 Palpitations: Secondary | ICD-10-CM

## 2023-02-12 DIAGNOSIS — R0683 Snoring: Secondary | ICD-10-CM | POA: Diagnosis not present

## 2023-02-12 DIAGNOSIS — Z170 Estrogen receptor positive status [ER+]: Secondary | ICD-10-CM

## 2023-02-12 DIAGNOSIS — Z853 Personal history of malignant neoplasm of breast: Secondary | ICD-10-CM | POA: Diagnosis present

## 2023-02-12 DIAGNOSIS — E7800 Pure hypercholesterolemia, unspecified: Secondary | ICD-10-CM

## 2023-02-12 DIAGNOSIS — R400 Somnolence: Secondary | ICD-10-CM | POA: Diagnosis not present

## 2023-02-12 HISTORY — DX: Palpitations: R00.2

## 2023-02-12 HISTORY — DX: Essential (primary) hypertension: I10

## 2023-02-12 HISTORY — DX: Pure hypercholesterolemia, unspecified: E78.00

## 2023-02-12 HISTORY — DX: Morbid (severe) obesity due to excess calories: E66.01

## 2023-02-12 MED ORDER — EXEMESTANE 25 MG PO TABS: 25.0000 mg | ORAL_TABLET | Freq: Every day | ORAL | 6 refills | Status: AC

## 2023-02-12 NOTE — Patient Instructions (Signed)
Medication Instructions:  Your physician recommends that you continue on your current medications as directed. Please refer to the Current Medication list given to you today.   *If you need a refill on your cardiac medications before your next appointment, please call your pharmacy*  Lab Work: NONE   Testing/Procedures: [facility] HOME SLEEP STUDY   Follow-Up: At [facility], you and your health needs are our priority.  As part of our continuing mission to provide you with exceptional heart care, we have created designated Provider Care Teams.  These Care Teams include your primary Cardiologist (physician) and Advanced Practice Providers (APPs -  Physician Assistants and Nurse Practitioners) who all work together to provide you with the care you need, when you need it.  We recommend signing up for the patient portal called "MyChart".  Sign up information is provided on this After Visit Summary.  MyChart is used to connect with patients for Virtual Visits (Telemedicine).  Patients are able to view lab/test results, encounter notes, upcoming appointments, etc.  Non-urgent messages can be sent to your provider as well.   To learn more about what you can do with MyChart, go to [email or web address].    Your next appointment:   3 month(s)  Provider:   Chilton Si, MD or Gillian Shields, NP    Other Instructions WatchPAT?  Is a FDA cleared portable home sleep study test that uses a watch and 3 points of contact to monitor 7 different channels, including your heart rate, oxygen saturations, body position, snoring, and chest motion.  The study is easy to use from the comfort of your own home and accurately detect sleep apnea.  Before bed, you attach the chest sensor, attached the sleep apnea bracelet to your nondominant hand, and attach the finger probe.  After the study, the raw data is downloaded from the watch and scored for apnea events.   For more information:  [email or web address]  Patient Testing Instructions:  Do not put battery into the device until bedtime when you are ready to begin the test. Please call the support number if you need assistance after following the instructions below: 24 hour support line- [phone] or [facility] support at [phone] (option 2)  Download the IntelWatchPAT One" app through the google play store or App Store  Be sure to turn on or enable access to bluetooth in settlings on your smartphone/ device  Make sure no other bluetooth devices are on and within the vicinity of your smartphone/ device and WatchPAT watch during testing.  Make sure to leave your smart phone/ device plugged in and charging all night.  When ready for bed:  Follow the instructions step by step in the WatchPAT One App to activate the testing device. For additional instructions, including video instruction, visit the WatchPAT One video on Youtube. You can search for WatchPat One within Youtube (video is 4 minutes and 18 seconds) or enter: https://youtube/watch?[email or web address]_vbiwxE Please note: You will be prompted to enter a Pin to connect via bluetooth when starting the test. The PIN will be assigned to you when you receive the test.  The device is disposable, but it recommended that you retain the device until you receive a call letting you know the study has been received and the results have been interpreted.  We will let you know if the study did not transmit to [place] properly after the test is completed. You do not need to call us to confirm the receipt of the test.  Please complete the  test within 48 hours of receiving PIN.   Frequently Asked Questions:  What is Watch Dennie Bible one?  A single use fully disposable home sleep apnea testing device and will not need to be returned after completion.  What are the requirements to use WatchPAT one?  The be able to have a successful watchpat one sleep study, you should have your Watch pat one  device, your smart phone, watch pat one app, your PIN number and Internet access What type of phone do I need?  You should have a smart phone that uses Android 5.1 and above or any Iphone with IOS 10 and above How can I download the WatchPAT one app?  Based on your device type search for WatchPAT one app either in google play for android devices or APP store for Iphone's Where will I get my PIN for the study?  Your PIN will be provided by your physician's office. It is used for authentication and if you lose/forget your PIN, please reach out to your providers office.  I do not have Internet at home. Can I do WatchPAT one study?  WatchPAT One needs Internet connection throughout the night to be able to transmit the sleep data. You can use your home/local internet or your cellular's data package. However, it is always recommended to use home/local Internet. It is estimated that between 20MB-30MB will be used with each study.However, the application will be looking for space in the phone to start the study.  What happens if I lose internet or bluetooth connection?  During the internet disconnection, your phone will not be able to transmit the sleep data. All the data, will be stored in your phone. As soon as the internet connection is back on, the phone will being sending the sleep data. During the bluetooth disconnection, WatchPAT one will not be able to to send the sleep data to your phone. Data will be kept in the [facility] one until two devices have bluetooth connection back on. As soon as the connection is back on, WatchPAT one will send the sleep data to the phone.  How long do I need to wear the WatchPAT one?  After you start the study, you should wear the device at least 6 hours.  How far should I keep my phone from the device?  During the night, your phone should be within 15 feet.  What happens if I leave the room for restroom or other reasons?  Leaving the room for any reason will not  cause any problem. As soon as your get back to the room, both devices will reconnect and will continue to send the sleep data. Can I use my phone during the sleep study?  Yes, you can use your phone as usual during the study. But it is recommended to put your watchpat one on when you are ready to go to bed.  How will I get my study results?  A soon as you completed your study, your sleep data will be sent to the provider. They will then share the results with you when they are ready.

## 2023-02-12 NOTE — Progress Notes (Signed)
Cardiology Office Note:  .    Date:  02/12/2023  ID:  MERCEDESE Patrick, DOB August 08, 1965, MRN [id number] PCP: Merri Brunette, MD  [facility] Providers Cardiologist:  None     History of Present Illness: .    Carla Patrick is a 57 y.o. female with breast cancer, hypertension, hyperlipidemia, and anxiety here for follow up of palpitations.  She previously saw Dr. Allyson Sabal 2015 for abnormal EKG.  EKG at that time revealed sinus rhythm with left axis deviation but no other abnormalities.  She has no symptoms and no further evaluation was needed at the time.  She saw Dr. Wyline Mood 09/2021 after diagnosis of right breast cancer and was planning for tamoxifen therapy.  She had an episode of lightheadedness while riding a bus.  She pulled over and had an episode of diarrhea but felt very lightheaded and as though she might die.  EKG at the time revealed LVH with repolarization abnormality unchanged from prior.  Coronary calcium score 07/2020 was 0.  She also noted some occasional palpitations.  Overall that the situation was thought to be due to a vagal episode in the setting of abdominal discomfort.  No further evaluation was needed at the time.   Today, she confirms that she wore a Zio heart monitor in 10/2022 and was referred to cardiology by her PCP for further evaluation. She has had infrequent episodes of heart palpitations associated with shortness of breath, weakness, diaphoresis. Her episodes have lasted for up to an hour, during which time her symptoms are consistent and she feels incapacitated and weak. At one time she had to walk across campus and was unsure if she would make it. During the episodes she states that "everything feels labored," although her dyspnea is unlike her asthmatic symptoms. The episodes have not been triggered by exertion. At home her blood pressures have ranged in the 130s-150s systolic. She had another clinic visit earlier this morning with a blood pressure of 133/78. In  our office her blood pressure is 132/96 initially, and 146/102 on manual recheck. Of note, she did not yet take her medications this morning. Usually she does take them around 8 AM. She has consumed more caffeine lately, but not routinely. Alcohol consumption is very rare. Previously had lost some weight on Wegovy, but ultimately she was unable to tolerate the side effects and she stopped taking it. Her husband has said that she snores. She denies any chest pain, peripheral edema, lightheadedness, headaches, syncope, orthopnea, or PND.  ROS:  Please see the history of present illness. All other systems are reviewed and negative.  (+) Palpitations associated with shortness of breath, generalized weakness, diaphoresis (+) Exertional shortness of breath (+) Snoring  Studies Reviewed: Marland Kitchen   EKG Interpretation Date/Time:  Thursday February 12 2023 10:16:37 EDT Ventricular Rate:  85 PR Interval:  166 QRS Duration:  100 QT Interval:  388 QTC Calculation: 461 R Axis:   -25  Text Interpretation: Normal sinus rhythm Possible Left atrial enlargement Left ventricular hypertrophy ( R in aVL , Cornell product ) Nonspecific T wave abnormality Prolonged QT When compared with ECG of 09-Jan-2023 09:13, No significant change was found Confirmed by Chilton Si ([id number]) on 02/12/2023 12:44:26 PM   EKG 02/12/23: Sinus rhythm.  Rate 85 bpm.  LVH with secondary repolarization abnormalities.   Risk Assessment/Calculations:     HYPERTENSION CONTROL Vitals:   02/12/23 1014 02/12/23 1019 02/12/23 1040  BP: (!) 136/94 (!) 132/96 (!) 146/102    The  patient's blood pressure is elevated above target today.  In order to address the patient's elevated BP: Blood pressure will be monitored at home to determine if medication changes need to be made.     Physical Exam:    VS:  BP (!) 146/102 (BP Location: Left Arm, Patient Position: Sitting, Cuff Size: Large)   Pulse 85   Ht 4' 11.5" (1.511 m)   Wt 210 lb 11.2 oz  (95.6 kg)   BMI 41.84 kg/m  , BMI Body mass index is 41.84 kg/m. GENERAL:  Well appearing HEENT: Pupils equal round and reactive, fundi not visualized, oral mucosa unremarkable NECK:  No jugular venous distention, waveform within normal limits, carotid upstroke brisk and symmetric, no bruits, no thyromegaly LUNGS:  Clear to auscultation bilaterally HEART:  RRR.  PMI not displaced or sustained,S1 and S2 within normal limits, no S3, no S4, no clicks, no rubs, no murmurs ABD:  Flat, positive bowel sounds normal in frequency in pitch, no bruits, no rebound, no guarding, no midline pulsatile mass, no hepatomegaly, no splenomegaly EXT:  2 plus pulses throughout, no edema, no cyanosis no clubbing SKIN:  No rashes no nodules NEURO:  Cranial nerves II through XII grossly intact, motor grossly intact throughout PSYCH:  Cognitively intact, oriented to person place and time  Wt Readings from Last 3 Encounters:  02/12/23 210 lb 11.2 oz (95.6 kg)  02/12/23 209 lb 11.2 oz (95.1 kg)  01/09/23 214 lb 11.2 oz (97.4 kg)     ASSESSMENT AND PLAN: .    # Palpitations Episodes of palpitations lasting up to an hour, causing weakness and incapacitation. No recent episodes. Previous heart monitor worn in May 2023, results pending. -Await heart monitor results from Dr. Renne Crigler. -Consider beta-blocker medication depending on rhythm identified.  # Possible Sleep Apnea Reports of snoring, feeling tired despite sleep, and needing to elevate head while sleeping. -Order home sleep study ([facility]) to screen for sleep apnea.  # Hypertension Blood pressure readings at home fluctuating, often above the target of 130/80. Currently on Telmisartan. -Encourage lifestyle modifications including diet and exercise. -Recheck blood pressure in a few months.  # Hyperlipidemia Currently on Rosuvastatin. -Continue Rosuvastatin.  # Anemia History of anemia, recent improvement after polyp removal. -Monitor hemoglobin  levels.  # Prediabetes History of elevated blood sugars, recently prescribed Ozempic but not yet started. -Encourage start of Ozempic. -Encourage dietary modifications and exercise.  Follow-up in a few months to reassess palpitations, blood pressure control, and response to lifestyle modifications.         Dispo:  FU with Nyeisha Goodall C. Duke Salvia, MD, [facility] in a few months.  I,Mathew Stumpf,acting as a Neurosurgeon for Chilton Si, MD.,have documented all relevant documentation on the behalf of Chilton Si, MD,as directed by  Chilton Si, MD while in the presence of Chilton Si, MD.  I, Demetria Lightsey C. Duke Salvia, MD have reviewed all documentation for this visit.  The documentation of the exam, diagnosis, procedures, and orders on 02/12/2023 are all accurate and complete.   Signed, Chilton Si, MD

## 2023-02-12 NOTE — Progress Notes (Deleted)
  Cardiology Office Note:  .   Date:  02/12/2023  ID:  Carla Patrick, DOB Aug 06, 1965, MRN [id number] PCP: Merri Brunette, MD  [facility] Providers Cardiologist:  None { Click to update primary MD,subspecialty MD or APP then REFRESH:1}   History of Present Illness: .   Carla Patrick is a 57 y.o. female with breast cancer, hypertension, hyperlipidemia, and anxiety here for follow up.  She previously saw Dr. Allyson Sabal 2015 for abnormal EKG.  EKG at that time revealed sinus rhythm with left axis deviation but no other abnormalities.  She has no symptoms and no further evaluation was needed at the time.  She saw Dr. Wyline Mood 09/2021 after diagnosis of right breast cancer and was planning for tamoxifen therapy.  She had an episode of lightheadedness while riding a bus.  She pulled over and had an episode of diarrhea but felt very lightheaded and as though she might die.  EKG at the time revealed LVH with repolarization abnormality unchanged from prior.  Coronary calcium score 07/2020 was 0.  She also noted some occasional palpitations.  Overall that the situation was thought to be due to a vagal episode in the setting of abdominal discomfort.  No further evaluation was needed at the time.  ROS: ***  Studies Reviewed: .        *** Risk Assessment/Calculations:   {Does this patient have ATRIAL FIBRILLATION?:[id number]} No BP recorded.  {Refresh Note OR Click here to enter BP  :1}***       Physical Exam:   VS:  There were no vitals taken for this visit.   Wt Readings from Last 3 Encounters:  01/09/23 214 lb 11.2 oz (97.4 kg)  12/16/22 211 lb 8 oz (95.9 kg)  11/20/21 206 lb (93.4 kg)    GEN: Well nourished, well developed in no acute distress NECK: No JVD; No carotid bruits CARDIAC: ***RRR, no murmurs, rubs, gallops RESPIRATORY:  Clear to auscultation without rales, wheezing or rhonchi  ABDOMEN: Soft, non-tender, non-distended EXTREMITIES:  No edema; No deformity   ASSESSMENT AND  PLAN: .   ***    {Are you ordering a CV Procedure (e.g. stress test, cath, DCCV, TEE, etc)?   Press F2        :[id number]}  Dispo: ***  Signed, Chilton Si, MD

## 2023-02-13 ENCOUNTER — Telehealth: Payer: Self-pay | Admitting: Hematology

## 2023-02-16 ENCOUNTER — Ambulatory Visit: Payer: BC Managed Care – PPO

## 2023-02-16 NOTE — Progress Notes (Signed)
Patient presents today to pick up custom molded foot orthotics, diagnosed with PTTD by Dr. Ralene Cork .   Orthotics were dispensed and fit was satisfactory. Reviewed instructions for break-in and wear. Written instructions given to patient.  Patient will follow up as needed.   Addison Bailey Cped, CFo, CFm

## 2023-02-24 ENCOUNTER — Other Ambulatory Visit: Payer: BC Managed Care – PPO

## 2023-03-10 ENCOUNTER — Telehealth (HOSPITAL_BASED_OUTPATIENT_CLINIC_OR_DEPARTMENT_OTHER): Payer: Self-pay

## 2023-03-10 NOTE — Telephone Encounter (Addendum)
Results sent to patient via mychart message!   ----- Message from Chilton Si sent at 03/09/2023  5:25 PM EDT ----- Monitor reviewed from Dr. Renne Crigler.  Recommend starting metoprolol xl 50mg  daily for episodes of SVT and NSVT on the monitor.  THanks, [employer]

## 2023-03-31 ENCOUNTER — Ambulatory Visit
Admission: EM | Admit: 2023-03-31 | Discharge: 2023-03-31 | Disposition: A | Discharge status: Home / Self Care | Payer: BC Managed Care – PPO | Attending: Internal Medicine | Admitting: Internal Medicine

## 2023-03-31 ENCOUNTER — Other Ambulatory Visit: Payer: Self-pay

## 2023-03-31 DIAGNOSIS — J069 Acute upper respiratory infection, unspecified: Secondary | ICD-10-CM

## 2023-03-31 DIAGNOSIS — J4521 Mild intermittent asthma with (acute) exacerbation: Secondary | ICD-10-CM | POA: Diagnosis not present

## 2023-03-31 LAB — POCT INFLUENZA A/B
Influenza A, POC: NEGATIVE
Influenza B, POC: NEGATIVE

## 2023-03-31 MED ORDER — PREDNISONE 20 MG PO TABS: 40.0000 mg | ORAL_TABLET | Freq: Every day | ORAL | 0 refills | Status: AC

## 2023-03-31 MED ORDER — ACETAMINOPHEN 325 MG PO TABS
975.0000 mg | ORAL_TABLET | Freq: Once | ORAL | Status: AC
  Administered 2023-03-31: 975 mg via ORAL

## 2023-03-31 MED ORDER — BENZONATATE 100 MG PO CAPS: 100.0000 mg | ORAL_CAPSULE | Freq: Three times a day (TID) | ORAL | 0 refills | Status: DC | PRN

## 2023-03-31 NOTE — ED Triage Notes (Signed)
Pt presents to the office for cough,fatigue, fever and body aches x 3 days. Pt is taking OTC cough and cold medication.

## 2023-03-31 NOTE — Discharge Instructions (Signed)
Rapid flu is negative.  I have prescribed you prednisone and cough medication.  Follow-up if any symptoms persist or worsen.  Continue albuterol use at home.

## 2023-03-31 NOTE — ED Provider Notes (Signed)
[facility]    CSN: [id number] Arrival date & time: 03/31/23  1452      History   Chief Complaint Chief Complaint  Patient presents with   Fatigue   Cough    HPI Carla Patrick is a 57 y.o. female.   Patient presents with cough, congestion, fever, fatigue, body aches that started about 3 days ago.  Covid test at home is negative.  Denies any obvious known sick contacts.  Has taken over-the-counter cold and flu medication with minimal improvement in symptoms.  Patient does have a history of asthma but denies shortness of breath or need for her albuterol inhaler since symptoms started.  Patient reports Tmax of 101 at home.   Cough   Past Medical History:  Diagnosis Date   Abnormal EKG    Acute sinusitis    Allergic rhinitis    Anemia    Asthma    Cancer (HCC) 11/2020   right breast [facility]   Depression    Elevated blood pressure reading    Essential hypertension 02/12/2023   Heart murmur    mild no cardiologist   History of carpal tunnel release    History of radiation therapy    Right Breast- 03/18/21-04/15/21-Dr. Antony Blackbird   Hyperlipidemia    Hypertension    Morbid obesity (HCC) 02/12/2023   Morbid obesity due to excess calories (HCC)    Obese    Osteoarthritis    Palpitations 02/12/2023   PMB (postmenopausal bleeding)    Pre-diabetes    Pure hypercholesterolemia 02/12/2023   Sciatica of left side    Sinus infection    Thrombocytosis    Wears glasses     Patient Active Problem List   Diagnosis Date Noted   Essential hypertension 02/12/2023   Pure hypercholesterolemia 02/12/2023   Morbid obesity (HCC) 02/12/2023   Palpitations 02/12/2023   Malignant neoplasm of upper-outer quadrant of right breast in female, estrogen receptor positive (HCC) 12/24/2020   Right knee pain 11/08/2013   Abnormal EKG 07/15/2013    Past Surgical History:  Procedure Laterality Date   BREAST EXCISIONAL BIOPSY  2007   left   BREAST LUMPECTOMY WITH  RADIOACTIVE SEED AND SENTINEL LYMPH NODE BIOPSY Right 01/30/2021   Procedure: RIGHT BREAST LUMPECTOMY WITH RADIOACTIVE SEED AND SENTINEL LYMPH NODE BIOPSY;  Surgeon: Griselda Miner, MD;  Location: [facility];  Service: General;  Laterality: Right;   CHOLECYSTECTOMY  10/04/2011   Procedure: LAPAROSCOPIC CHOLECYSTECTOMY WITH INTRAOPERATIVE CHOLANGIOGRAM;  Surgeon: Emelia Loron, MD;  Location: [facility];  Service: General;  Laterality: N/A;   DILATATION & CURETTAGE/HYSTEROSCOPY WITH MYOSURE N/A 01/09/2023   Procedure: DILATATION & CURETTAGE/HYSTEROSCOPY WITH MYOSURE;  Surgeon: Maxie Better, MD;  Location: [facility];  Service: Gynecology;  Laterality: N/A;    OB History   No obstetric history on file.      Home Medications    Prior to Admission medications   Medication Sig Start Date End Date Taking? Authorizing Provider  benzonatate (TESSALON) 100 MG capsule Take 1 capsule (100 mg total) by mouth every 8 (eight) hours as needed for cough. 03/31/23  Yes Sukaina Toothaker, Acie Fredrickson, FNP  exemestane (AROMASIN) 25 MG tablet Take 1 tablet (25 mg total) by mouth daily after breakfast. 02/12/23  Yes Pollyann Samples, NP  predniSONE (DELTASONE) 20 MG tablet Take 2 tablets (40 mg total) by mouth daily for 5 days. 03/31/23 04/05/23 Yes Adyen Bifulco, Acie Fredrickson, FNP  telmisartan (MICARDIS) 40 MG tablet Take 1 tablet (  40 mg total) by mouth daily. 10/30/21  Yes   ibuprofen (ADVIL) 800 MG tablet Take 1 tablet (800 mg total) by mouth every 8 (eight) hours as needed. 01/09/23   Maxie Better, MD  PROAIR HFA 108 (90 BASE) MCG/ACT inhaler  11/02/13   [provider]  rosuvastatin (CRESTOR) 20 MG tablet Take 1 tablet (20 mg total) by mouth daily. 01/07/23     Semaglutide,0.25 or 0.5MG /DOS, (OZEMPIC, 0.25 OR 0.5 MG/DOSE,) 2 MG/3ML SOPN Inject 0.5 mg into the skin once a week. 01/07/23     SYMBICORT 160-4.5 MCG/ACT inhaler  02/16/20   [provider]    Family History Family History   Problem Relation Age of Onset   Hypertension Mother    Asthma Mother    Diabetes Mother    HIV Father    Heart attack Maternal Grandmother 40   Heart disease Maternal Grandmother    Diabetes Maternal Grandfather    Diabetes Paternal Grandmother     Social History Social History   Tobacco Use   Smoking status: Never   Smokeless tobacco: Never  Vaping Use   Vaping status: Never Used  Substance Use Topics   Alcohol use: Yes    Comment: rare glass of wine   Drug use: No     Allergies   Patient has no known allergies.   Review of Systems Review of Systems Per HPI  Physical Exam Triage Vital Signs ED Triage Vitals [03/31/23 1625]  Encounter Vitals Group     BP (!) 150/99     Systolic BP Percentile      Diastolic BP Percentile      Pulse Rate (!) 123     Resp 18     Temp (!) 101 F (38.3 C)     Temp Source Oral     SpO2 99 %     Weight      Height      Head Circumference      Peak Flow      Pain Score 5     Pain Loc      Pain Education      Exclude from Growth Chart    No data found.  Updated Vital Signs BP (!) 150/99 (BP Location: Left Arm)   Pulse (!) 103   Temp (!) 101 F (38.3 C) (Oral)   Resp 18   SpO2 99%   Visual Acuity Right Eye Distance:   Left Eye Distance:   Bilateral Distance:    Right Eye Near:   Left Eye Near:    Bilateral Near:     Physical Exam Constitutional:      General: She is not in acute distress.    Appearance: Normal appearance. She is not toxic-appearing or diaphoretic.  HENT:     Head: Normocephalic and atraumatic.     Right Ear: Tympanic membrane and ear canal normal.     Left Ear: Tympanic membrane and ear canal normal.     Nose: Congestion present.     Mouth/Throat:     Mouth: Mucous membranes are moist.     Pharynx: No posterior oropharyngeal erythema.  Eyes:     Extraocular Movements: Extraocular movements intact.     Conjunctiva/sclera: Conjunctivae normal.     Pupils: Pupils are equal, round, and  reactive to light.  Cardiovascular:     Rate and Rhythm: Normal rate and regular rhythm.     Pulses: Normal pulses.     Heart sounds: Normal heart sounds.  Pulmonary:     Effort: Pulmonary effort is normal. No respiratory distress.     Breath sounds: No stridor. Wheezing present. No rhonchi or rales.     Comments: Wheezing noted bilaterally to auscultation. Abdominal:     General: Abdomen is flat. Bowel sounds are normal.     Palpations: Abdomen is soft.  Musculoskeletal:        General: Normal range of motion.     Cervical back: Normal range of motion.  Skin:    General: Skin is warm and dry.  Neurological:     General: No focal deficit present.     Mental Status: She is alert and oriented to person, place, and time. Mental status is at baseline.  Psychiatric:        Mood and Affect: Mood normal.        Behavior: Behavior normal.      UC Treatments / Results  Labs (all labs ordered are listed, but only abnormal results are displayed) Labs Reviewed  POCT INFLUENZA A/B    EKG   Radiology No results found.  Procedures Procedures (including critical care time)  Medications Ordered in UC Medications  acetaminophen (TYLENOL) tablet 975 mg (975 mg Oral Given 03/31/23 1701)    Initial Impression / Assessment and Plan / UC Course  I have reviewed the triage vital signs and the nursing notes.  Pertinent labs & imaging results that were available during my care of the patient were reviewed by me and considered in my medical decision making (see chart for details).     Patient presents with symptoms likely from a viral upper respiratory infection.  Suspect mild asthma exacerbation due to viral illness.  Patient is not tachypneic and oxygen is normal so do not think that emergent evaluation is necessary.  Patient is nontoxic appearing and not in need of emergent medical intervention.  Rapid flu negative.  COVID testing deferred given negative COVID test at  home.  Recommended symptom control with medications and supportive care.  Will treat with steroid burst and benzonatate for cough.  No obvious contraindication to steroid noted in patient's history.  Patient reports that she is cancer free so that should be safe especially in a short course.  Return if symptoms fail to improve. Patient states understanding and is agreeable.  Discharged with PCP followup.  Final Clinical Impressions(s) / UC Diagnoses   Final diagnoses:  Viral upper respiratory tract infection with cough  Mild intermittent asthma with acute exacerbation     Discharge Instructions      Rapid flu is negative.  I have prescribed you prednisone and cough medication.  Follow-up if any symptoms persist or worsen.  Continue albuterol use at home.     ED Prescriptions     Medication Sig Dispense Auth. Provider   predniSONE (DELTASONE) 20 MG tablet Take 2 tablets (40 mg total) by mouth daily for 5 days. 10 tablet [place], [place] E, [place]   benzonatate (TESSALON) 100 MG capsule Take 1 capsule (100 mg total) by mouth every 8 (eight) hours as needed for cough. 21 capsule [place], Acie Fredrickson, [place]      PDMP not reviewed this encounter.   Gustavus Bryant, [place] 03/31/23 1723

## 2023-04-01 ENCOUNTER — Other Ambulatory Visit: Payer: BC Managed Care – PPO

## 2023-04-27 ENCOUNTER — Telehealth: Payer: Self-pay

## 2023-04-27 NOTE — Telephone Encounter (Signed)
**Note De-Identified Gilma Bessette Obfuscation** Ordering provider: Dr Duke Salvia Associated diagnoses: Snoring-R06.83 and Fatigue-R53.83 WatchPAT PA obtained on 04/27/2023 by Lucile Hillmann, Lorelle Formosa, LPN. Authorization: Per [employer] Provider Portal: The following solutions for the service date entered do not require Pre-Authorization by [employer]: CPT Code: 40347  Patient notified of PIN ([id number]) on 04/27/2023 Raphaella Larkin Notification Method: MyChart message. I called the pt but got no answer so I left a message on her VM (Ok per [facility]) advising her that I am sending her a [facility] message and that if she has any questions or concerns to call Elease Hashimoto back at Dr Leonides Sake office at [facility] at [phone].  Phone note routed to covering staff for follow-up.

## 2023-04-28 NOTE — Telephone Encounter (Signed)
Noted  

## 2023-05-11 NOTE — Progress Notes (Unsigned)
I connected with Carla Patrick on 05/12/23 at  9:30 AM EST by telephone and verified that I am speaking with the correct person using two identifiers.   I discussed the limitations, risks, security and privacy concerns of performing an evaluation and management service by telemedicine and the availability of in-person appointments. I also discussed with the patient that there may be a patient responsible charge related to this service. The patient expressed understanding and agreed to proceed.   Other persons participating in the visit and their role in the encounter: ***   Patient's location: ***  Provider's location: ***   Chief Complaint: ***    Patient Care Team: Merri Brunette, MD as PCP - General (Internal Medicine) Pershing Proud, RN as Oncology Nurse Navigator Donnelly Angelica, RN as Oncology Nurse Navigator Griselda Miner, MD as Consulting Physician (General Surgery) Malachy Mood, MD as Consulting Physician (Hematology) Antony Blackbird, MD as Consulting Physician (Radiation Oncology) Pollyann Samples, NP as Nurse Practitioner (Nurse Practitioner)  Clinic Day:  05/12/2023  Referring physician: Merri Brunette, MD  ASSESSMENT & PLAN:   Assessment & Plan: Malignant neoplasm of upper-outer quadrant of right breast in female, estrogen receptor positive (HCC) stage IA right breast invasive lobular carcinoma, ER+/PR-/HER2- pT1bN0iM0 -Diagnosed in 11/2020, s/p lumpectomy and adjuvant radiation therapy -She took some anti-estrogen therapy with Tamoxifen intermittently from 05/2021 - 08/2022; tolerated moderately well with fatigue but she stopped due to vaginal bleeding -I reviewed the case with ob/gyn Dr. Cherly Hensen, confirmed she is postmenopausal. S/p D&C/hysteroscopy which showed benign endometrial polyp -Carla Patrick has recovered well, bleeding stopped. We discussed further anti-estrogen therapy.  -Due to her ER+ lobular histology and post-menopausal status, AI was recommended. Due to  her baseline arthritis (knees), I recommend Aromasin. We reviewed potential SE's, she agrees to try -recent labs reviewed, which shows moderate anemia while she had vaginal bleeding. It was recommended she take prenatal vitamin with iron for now -Screening MRI now scheduled for 05/20/2023. -Phone f/up in 3 months for AI tox check, then f/up in 6 months, or sooner if needed    The patient understands the plans discussed today and is in agreement with them.  She knows to contact our office if she develops concerns prior to her next appointment.  I provided *** minutes of face-to-face time during this encounter and > 50% was spent counseling as documented under my assessment and plan.    [name], NP  [facility] - A DEPT OF [facility] [address] [place] [address] Dept: [phone] Dept Fax: [phone]   No orders of the defined types were placed in this encounter.     CHIEF COMPLAINT:  CC: Right breast cancer -estrogen receptor positive  Current Treatment: Aromatase inhibitor started 02/12/2023  INTERVAL HISTORY:  Carla Patrick is here today for repeat clinical assessment.  She last saw Clayborn Heron, NP on 02/12/2023.  She was switched to aromatase inhibitor due to vaginal bleeding.  She is scheduled for bilateral breast MRI on 05/20/2023.  She denies fevers or chills. She denies pain. Her appetite is good. Her weight {Weight change:10426}.  I have reviewed the past medical history, past surgical history, social history and family history with the patient and they are unchanged from previous note.  ALLERGIES:  has No Known Allergies.  MEDICATIONS:  Current Outpatient Medications  Medication Sig Dispense Refill   benzonatate (TESSALON) 100 MG capsule Take 1 capsule (100  mg total) by mouth every 8 (eight) hours as needed for cough. 21 capsule 0   exemestane (AROMASIN) 25 MG tablet Take 1 tablet (25 mg total) by  mouth daily after breakfast. 30 tablet 6   ibuprofen (ADVIL) 800 MG tablet Take 1 tablet (800 mg total) by mouth every 8 (eight) hours as needed. 30 tablet 11   PROAIR HFA 108 (90 BASE) MCG/ACT inhaler      rosuvastatin (CRESTOR) 20 MG tablet Take 1 tablet (20 mg total) by mouth daily. 90 tablet 3   Semaglutide,0.25 or 0.5MG /DOS, (OZEMPIC, 0.25 OR 0.5 MG/DOSE,) 2 MG/3ML SOPN Inject 0.5 mg into the skin once a week. 3 mL 1   SYMBICORT 160-4.5 MCG/ACT inhaler      telmisartan (MICARDIS) 40 MG tablet Take 1 tablet (40 mg total) by mouth daily. 90 tablet 1   No current facility-administered medications for this visit.    HISTORY OF PRESENT ILLNESS:   Oncology History Overview Note   Cancer Staging  Malignant neoplasm of upper-outer quadrant of right breast in female, estrogen receptor positive (HCC) Staging form: Breast, AJCC 8th Edition - Clinical stage from 12/26/2020: Stage IA (cT1b, cN0, cM0, G2, ER+, PR-, HER2-) - Unsigned Stage prefix: Initial diagnosis Histologic grading system: 3 grade system Staged by: Pathologist and managing physician Stage used in treatment planning: Yes National guidelines used in treatment planning: Yes Type of national guideline used in treatment planning: NCCN - Pathologic stage from 01/12/2021: Stage IA (pT1c, pN0(i+), cM0, G2, ER+, PR-, HER2-, Oncotype DX score: 22) - Signed by Malachy Mood, MD on 04/27/2021 Stage prefix: Initial diagnosis Multigene prognostic tests performed: Oncotype DX Recurrence score range: Greater than or equal to 11 Histologic grading system: 3 grade system Residual tumor (R): R0 - None    Malignant neoplasm of upper-outer quadrant of right breast in female, estrogen receptor positive (HCC)  12/05/2020 Mammogram   Right Breast Diagnostic Mammogram; Right Breast Ultrasound  IMPRESSION: The 0.4 x 0.5 x 0.7 cm taller-than-wide irregular mass in the right breast is suspicious of malignancy. No significant abnormalities were seen  sonographically in the right axilla.   12/17/2020 Pathology Results   Diagnosis:  Breast, right, needle core biopsy, 12 O'CLOCK middle depth, 9 cmfn - INVASIVE MAMMARY CARCINOMA - MAMMARY CARCINOMA IN SITU  The carcinoma appears Nottingham grade 2 of 3. E-cadherin is negative in the invasive carcinoma, supporting lobular origin.  PROGNOSTIC INDICATORS Results: IMMUNOHISTOCHEMICAL AND MORPHOMETRIC ANALYSIS PERFORMED MANUALLY The tumor cells are EQUIVOCAL for Her2 (2+). Her2 by FISH will be performed and results reported separately. Estrogen Receptor: 90%, POSITIVE, MODERATE STAINING INTENSITY Progesterone Receptor: 0%, NEGATIVE Proliferation Marker Ki67: 1%  FLUORESCENCE IN-SITU HYBRIDIZATION Results: GROUP 5: HER 2 **NEGATIVE** Equivocal form of amplification of the HER2 gene was detected in the IHC 2+ tissue sample received from this individual. HER2 FISH was performed by a technologist and cell imaging and analysis on the BioView. RATIO OF ER2/CEN17 SIGNALS 1.15 AVERAGE HER2 COPY NUMBER PER CELL 1.95   12/24/2020 Initial Diagnosis   Malignant neoplasm of upper-outer quadrant of right breast in female, estrogen receptor positive (HCC)   01/12/2021 Cancer Staging   Staging form: Breast, AJCC 8th Edition - Pathologic stage from 01/12/2021: Stage IA (pT1c, pN0(i+), cM0, G2, ER+, PR-, HER2-, Oncotype DX score: 22) - Signed by Malachy Mood, MD on 04/27/2021 Stage prefix: Initial diagnosis Multigene prognostic tests performed: Oncotype DX Recurrence score range: Greater than or equal to 11 Histologic grading system: 3 grade system Residual tumor (R): R0 -  None   01/30/2021 Definitive Surgery   FINAL MICROSCOPIC DIAGNOSIS:   A. LYMPH NODE, RIGHT AXILLARY #1, SENTINEL, EXCISION:  -  Isolated tumor cells identified in one lymph node (0i/1)  -  See comment   B. LYMPH NODE, RIGHT AXILLARY, SENTINEL, EXCISION:  -  No carcinoma identified in one lymph node (0/1)  -  See comment   C.  LYMPH NODE, RIGHT AXILLARY #2, SENTINEL, EXCISION:  -  No carcinoma identified in one lymph node (0/1)  -  See comment   D. LYMPH NODE, RIGHT AXILLARY, SENTINEL, EXCISION:  -  No carcinoma identified in one lymph node (0/1)  -  See comment   E. LYMPH NODE, RIGHT AXILLARY #3, SENTINEL, EXCISION:  -  No carcinoma identified in one lymph node (0/1)  -  See comment   F. LYMPH NODE, RIGHT AXILLARY #4, SENTINEL, EXCISION:  -  No carcinoma identified in one lymph node (0/1)  -  See comment   G. LYMPH NODE, RIGHT AXILLARY, SENTINEL, EXCISION:  -  No carcinoma identified in one lymph node (0/1)  -  See comment   H. LYMPH NODE, RIGHT AXILLARY #5, SENTINEL, EXCISION:  -  Benign fibroadipose tissue  -  No lymphoid tissue identified   I. BREAST, RIGHT, LUMPECTOMY:  -  Invasive lobular carcinoma, Nottingham grade 2 of 3, 1.5 cm  -  Lobular neoplasia (atypical lobular hyperplasia)  -  Margins uninvolved by carcinoma (0.7 cm; inferior margin)  -  Previous biopsy site changes present  -  See oncology table and comment below    01/30/2021 Miscellaneous   Oncotype Recurrence Score of 22, risk of distant metastasis is 8% with antiestrogen therapy alone.   03/18/2021 - 04/15/2021 Radiation Therapy   Site Technique Total Dose (Gy) Dose per Fx (Gy) Completed Fx Beam Energies  Breast, Right: Breast_Rt 3D 40.05/40.05 2.67 15/15 10X  Breast, Right: Breast_Rt_Bst 3D 10/10 2 5/5 6X, 10X     05/2021 - 08/2022 Anti-estrogen oral therapy   Took low dose Tamoxifen intermittently, stopped 08/2022. Did not tolerate full dose due to SE's. After she stopped, pt developed abnormal vaginal bleeding.    12/16/2022 Survivorship   SCP delivered by Santiago Glad, NP       REVIEW OF SYSTEMS:   Constitutional: Denies fevers, chills or abnormal weight loss Eyes: Denies blurriness of vision Ears, nose, mouth, throat, and face: Denies mucositis or sore throat Respiratory: Denies cough, dyspnea or  wheezes Cardiovascular: Denies palpitation, chest discomfort or lower extremity swelling Gastrointestinal:  Denies nausea, heartburn or change in bowel habits Skin: Denies abnormal skin rashes Lymphatics: Denies new lymphadenopathy or easy bruising Neurological:Denies numbness, tingling or new weaknesses Behavioral/Psych: Mood is stable, no new changes  All other systems were reviewed with the patient and are negative.   VITALS:  There were no vitals taken for this visit.  Wt Readings from Last 3 Encounters:  02/12/23 210 lb 11.2 oz (95.6 kg)  02/12/23 209 lb 11.2 oz (95.1 kg)  01/09/23 214 lb 11.2 oz (97.4 kg)    There is no height or weight on file to calculate BMI.  Performance status (ECOG): {CHL ONC [id number]  PHYSICAL EXAM:   GENERAL:alert, no distress and comfortable SKIN: skin color, texture, turgor are normal, no rashes or significant lesions EYES: normal, Conjunctiva are pink and non-injected, sclera clear OROPHARYNX:no exudate, no erythema and lips, buccal mucosa, and tongue normal  NECK: supple, thyroid normal size, non-tender, without nodularity LYMPH:  no palpable lymphadenopathy in  the cervical, axillary or inguinal LUNGS: clear to auscultation and percussion with normal breathing effort HEART: regular rate & rhythm and no murmurs and no lower extremity edema ABDOMEN:abdomen soft, non-tender and normal bowel sounds Musculoskeletal:no cyanosis of digits and no clubbing  NEURO: alert & oriented x 3 with fluent speech, no focal motor/sensory deficits  LABORATORY DATA:  I have reviewed the data as listed    Component Value Date/Time   NA 139 01/09/2023 0909   NA 144 09/20/2021 1122   K 3.8 01/09/2023 0909   CL 104 01/09/2023 0909   CO2 30 12/16/2022 0941   GLUCOSE 137 (H) 01/09/2023 0909   BUN 14 01/09/2023 0909   BUN 10 09/20/2021 1122   CREATININE 0.70 01/09/2023 0909   CREATININE 0.74 12/16/2022 0941   CALCIUM 9.4 12/16/2022 0941   PROT 6.8  12/16/2022 0941   PROT 6.7 09/20/2021 1122   ALBUMIN 4.1 12/16/2022 0941   ALBUMIN 4.4 09/20/2021 1122   AST 10 (L) 12/16/2022 0941   ALT 19 12/16/2022 0941   ALKPHOS 78 12/16/2022 0941   BILITOT 0.8 12/16/2022 0941   GFRNONAA >60 12/16/2022 0941   GFRAA >90 10/05/2011 0443    No results found for: "SPEP", "UPEP"  Lab Results  Component Value Date   WBC 7.8 01/09/2023   NEUTROABS 5.2 12/16/2022   HGB 10.2 (L) 01/09/2023   HCT 30.0 (L) 01/09/2023   MCV 99.3 01/09/2023   PLT 420 (H) 01/09/2023      Chemistry      Component Value Date/Time   NA 139 01/09/2023 0909   NA 144 09/20/2021 1122   K 3.8 01/09/2023 0909   CL 104 01/09/2023 0909   CO2 30 12/16/2022 0941   BUN 14 01/09/2023 0909   BUN 10 09/20/2021 1122   CREATININE 0.70 01/09/2023 0909   CREATININE 0.74 12/16/2022 0941      Component Value Date/Time   CALCIUM 9.4 12/16/2022 0941   ALKPHOS 78 12/16/2022 0941   AST 10 (L) 12/16/2022 0941   ALT 19 12/16/2022 0941   BILITOT 0.8 12/16/2022 0941       RADIOGRAPHIC STUDIES: I have personally reviewed the radiological images as listed and agreed with the findings in the report. No results found.

## 2023-05-11 NOTE — Assessment & Plan Note (Addendum)
stage IA right breast invasive lobular carcinoma, ER+/PR-/HER2- pT1bN0iM0 -Diagnosed in 11/2020, s/p lumpectomy and adjuvant radiation therapy -She took some anti-estrogen therapy with Tamoxifen intermittently from 05/2021 - 08/2022; tolerated moderately well with fatigue but she stopped due to vaginal bleeding -I reviewed the case with ob/gyn Dr. Cherly Hensen, confirmed she is postmenopausal. S/p D&C/hysteroscopy which showed benign endometrial polyp -Ms. Oldenkamp has recovered well, bleeding stopped. We discussed further anti-estrogen therapy.  -Due to her ER+ lobular histology and post-menopausal status, AI was recommended. Due to her baseline arthritis (knees), I recommend Aromasin. We reviewed potential SE's, she agrees to try -recent labs reviewed, which shows moderate anemia while she had vaginal bleeding. It was recommended she take prenatal vitamin with iron for now -Screening MRI now scheduled for 05/20/2023. -Phone f/up in 3 months for AI tox check, then f/up in 6 months, or sooner if needed

## 2023-05-12 ENCOUNTER — Telehealth: Payer: BC Managed Care – PPO | Admitting: Hematology

## 2023-05-12 ENCOUNTER — Inpatient Hospital Stay: Payer: BC Managed Care – PPO | Attending: Nurse Practitioner | Admitting: Nurse Practitioner

## 2023-05-12 DIAGNOSIS — Z170 Estrogen receptor positive status [ER+]: Secondary | ICD-10-CM

## 2023-05-12 DIAGNOSIS — Z91199 Patient's noncompliance with other medical treatment and regimen due to unspecified reason: Secondary | ICD-10-CM

## 2023-05-12 DIAGNOSIS — C50411 Malignant neoplasm of upper-outer quadrant of right female breast: Secondary | ICD-10-CM

## 2023-05-18 ENCOUNTER — Ambulatory Visit (HOSPITAL_BASED_OUTPATIENT_CLINIC_OR_DEPARTMENT_OTHER): Payer: BC Managed Care – PPO | Admitting: Family

## 2023-05-20 ENCOUNTER — Ambulatory Visit
Admission: RE | Admit: 2023-05-20 | Discharge: 2023-05-20 | Disposition: A | Discharge status: Home / Self Care | Payer: BC Managed Care – PPO | Source: Ambulatory Visit | Attending: Nurse Practitioner | Admitting: Nurse Practitioner

## 2023-05-20 DIAGNOSIS — C50411 Malignant neoplasm of upper-outer quadrant of right female breast: Secondary | ICD-10-CM

## 2023-05-20 MED ORDER — GADOPICLENOL 0.5 MMOL/ML IV SOLN
10.0000 mL | Freq: Once | INTRAVENOUS | Status: AC | PRN
  Administered 2023-05-20: 10 mL via INTRAVENOUS

## 2023-05-22 ENCOUNTER — Telehealth: Payer: Self-pay | Admitting: Nurse Practitioner

## 2023-05-25 ENCOUNTER — Telehealth (HOSPITAL_BASED_OUTPATIENT_CLINIC_OR_DEPARTMENT_OTHER): Payer: Self-pay | Admitting: Cardiovascular Disease

## 2023-05-25 MED ORDER — METOPROLOL SUCCINATE ER 50 MG PO TB24: 50.0000 mg | ORAL_TABLET | Freq: Every day | ORAL | 3 refills | Status: AC

## 2023-05-25 NOTE — Telephone Encounter (Signed)
  Per MyChart scheduling message:   Initial complaint: Shortness of breath; fatique; tightness in chest   Pt c/o Shortness Of Breath: STAT if SOB developed within the last 24 hours or pt is noticeably SOB on the phone  1. Are you currently SOB (can you hear that pt is SOB on the phone)?   2. How long have you been experiencing SOB?   3. Are you SOB when sitting or when up moving around?   4. Are you currently experiencing any other symptoms?     1. Yes although it's a little better than it has been in the last few days. (I can walk around longer than yesterday without feeling like I have to sit down).   2. Since I had a respiratory infection at the beginning of Nov. I associated the breathing issues with the infection but once it was over they (along with a cough) continued getting progressively worse.   3. Both but much worse when I'm moving around.   4. 1) Fatigue; 2) tightness of chest at times, 3) weakness (like my legs are going to give out)

## 2023-05-25 NOTE — Telephone Encounter (Signed)
Spoke with patient regarding shortness of breath she has been expierencing Saw Dr Renne Crigler 12/20 and reviewed Echo with her (office note under media) Reduced EF noted on Echo  Was given Rx for Lasix 20 mg and K+ 20 meq both daily  She has not noticed much difference in shortness of breath Does have indention where socks are  Urinating more frequently with Lasix  Has labs scheduled 12/26 and follow up with Dr Renne Crigler 12/30  Also patient was to start Toprol 50 mg daily 10/8 per phone message, patient just viewed today  "Monitor reviewed from Dr. Renne Crigler.  Recommend starting metoprolol xl 50mg  daily for episodes of SVT and NSVT on the monitor.  THanks, Tiffany"    Discussed with Ronn Melena NP, will have patient start Toprol as suggested and keep follow ups as scheduled with Dr Renne Crigler. See patient 12/31 at 1pm  Advised patient, verbalized understanding

## 2023-06-02 ENCOUNTER — Ambulatory Visit (HOSPITAL_BASED_OUTPATIENT_CLINIC_OR_DEPARTMENT_OTHER): Payer: BC Managed Care – PPO | Admitting: Family

## 2023-06-02 ENCOUNTER — Encounter (HOSPITAL_BASED_OUTPATIENT_CLINIC_OR_DEPARTMENT_OTHER): Payer: Self-pay | Admitting: Family

## 2023-06-02 VITALS — BP 138/92 | HR 82 | Ht 59.5 in | Wt 210.6 lb

## 2023-06-02 DIAGNOSIS — I10 Essential (primary) hypertension: Secondary | ICD-10-CM

## 2023-06-02 DIAGNOSIS — I471 Supraventricular tachycardia, unspecified: Secondary | ICD-10-CM

## 2023-06-02 DIAGNOSIS — I5022 Chronic systolic (congestive) heart failure: Secondary | ICD-10-CM

## 2023-06-02 DIAGNOSIS — R072 Precordial pain: Secondary | ICD-10-CM | POA: Diagnosis not present

## 2023-06-02 DIAGNOSIS — R0683 Snoring: Secondary | ICD-10-CM

## 2023-06-02 DIAGNOSIS — R400 Somnolence: Secondary | ICD-10-CM

## 2023-06-02 DIAGNOSIS — I700 Atherosclerosis of aorta: Secondary | ICD-10-CM

## 2023-06-02 MED ORDER — METOPROLOL TARTRATE 100 MG PO TABS: 100.0000 mg | ORAL_TABLET | Freq: Once | ORAL | 0 refills | Status: DC

## 2023-06-02 MED ORDER — DAPAGLIFLOZIN PROPANEDIOL 10 MG PO TABS: 10.0000 mg | ORAL_TABLET | Freq: Every day | ORAL | 0 refills | Status: AC

## 2023-06-02 MED ORDER — DAPAGLIFLOZIN PROPANEDIOL 10 MG PO TABS: 10.0000 mg | ORAL_TABLET | Freq: Every day | ORAL | 5 refills | Status: DC

## 2023-06-02 MED ORDER — METOPROLOL TARTRATE 100 MG PO TABS
100.0000 mg | ORAL_TABLET | Freq: Two times a day (BID) | ORAL | 0 refills | Status: DC
Start: 2023-06-02 — End: 2023-06-02

## 2023-06-02 NOTE — Progress Notes (Signed)
 Cardiology Office Note:  .   Date:  06/02/2023  ID:  Carla Patrick, DOB 07-09-1965, MRN [id number] PCP: Clarice Nottingham, MD  [facility] Providers Cardiologist:  None    History of Present Illness: .   Carla Patrick is a 57 y.o. female with history of breast cancer hypertension hyperlipidemia, anxiety, cardiovascular sclerosis, HFrEF.   Established 2015 for abnormal EKG with left axis deviation, no further evaluation needed. Saw Dr. Alvan 09/2021 after diagnosis of right breast cancer planning for tamoxifen  therapy. Coronary calcium  score 07/2020 of 0. She wore monitor 10/2022 at direction of her PCP due to palpitations with episodes of SVT, when results received Dr. Raford recommended initiation of Metoprolol  which Carla Patrick just started recently. She was also recommended for sleep study 02/12/23, but has not yet completed.   Echo 04/13/23 ordered by PCP with LV normal size, moderate decrease in global wall motion, indeterminate diastolic function, LVEF 41%, LA mildly dilated, normal RVSF and size, mild AI, mild MR, mild to moderate TR, moderate PAH, trace PR. PCP initiated Lasix 20mg  and Potassium 20mEq daily. Has been referred to pulmonology for persistent nonproductive cough and pulmonary hypertension on echo with consult scheduled 07/2023.   Presents today for follow up. 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 Reports shortness of breath at rest and also it worsens with activity. Notes respiratory infection early November for which she was treated with abx, prednisone , inhalers. However despite treatment her dyspnea did not improve. She notes orthopnea, has been sleeping elevated. No PND. Reports not much swelling. Feels as if she can't get a good deep breath. Since starting Furosemide, Metoprolol  notes some improvement.. Has arm BP cuff at home but has not been using routinely. Reports occasional lightheadedness particularly when she was  having palpitations and episodes of SVT. Reviewed echo and reduced LVEF. She shares her grandmother died at 80 of an enlarged heart. She has not yet completed sleep study but plans to do so. Notes taking her Telmisartan  inconsistently 3 days per week.  ROS: Please see the history of present illness.    All other systems reviewed and are negative.   Studies Reviewed: .         Risk Assessment/Calculations:            Physical Exam:   VS:  BP (!) 138/92   Pulse 82   Ht 4' 11.5 (1.511 m)   Wt 210 lb 9.6 oz (95.5 kg)   SpO2 96%   BMI 41.82 kg/m    Wt Readings from Last 3 Encounters:  06/02/23 210 lb 9.6 oz (95.5 kg)  02/12/23 210 lb 11.2 oz (95.6 kg)  02/12/23 209 lb 11.2 oz (95.1 kg)    GEN: Well nourished, overweight, well developed in no acute distress NECK: No JVD; No carotid bruits CARDIAC: RRR, no murmurs, rubs, gallops RESPIRATORY:  Clear to auscultation without rales, wheezing or rhonchi  ABDOMEN: Soft, non-tender, non-distended EXTREMITIES:  No edema; No deformity   ASSESSMENT AND PLAN: .    HFrEF / PAH - Echo 05/13/23 LVEF 41% newly reduced. NYHA II-III with exertional dyspnea.  GDMT: Continue Lasix 10 mg daily, potassium 40 mg daily, Toprol  50mg  daily, Telmisartan  80mg  daily.  Encouraged to take telmisartan  daily as presently taking inconsistently. Discussed transition to Entresto, prefers to continue Telmisartan  at this time. Rx Farxiga  10mg  daily for optimization of GDMT. 2 weeks samples and free-30-day coupon provided. Future considerations include Entresto, Spironolactone.  Plan for cardiac CTA to rule out ischemia as etiology. 05/28/23 creatinine 0.79, GFR 88.   Aortic atherosclerosis / HLD -cardiac CTA, as above.  Continue rosuvastatin  20 mg daily.  SVT - Continue Toprol  50mg  daily.   HTN - BP elevated in setting of inconsistent dosing of Telmisartan . Encouraged to take Telmisartan  daily as prescribed. Continue Lasix, Toprol . Discussed to monitor BP at home at  least 2 hours after medications and sitting for 5-10 minutes.   Snores / Possible sleep apnea -Itamar home sleep study previously approved and she has at home.  She verbalized understanding to complete the study within code 1234.  DM2 -not tolerate Ozempic .  PCP started Mounjaro yesterday and planning to start this weekend.       Dispo: follow up in 6 weeks with Reche GORMAN Finder, NP and in 4 months with Dr. Raford  Signed, Reche GORMAN Finder, NP

## 2023-06-02 NOTE — Patient Instructions (Signed)
 Medication Instructions:  Your physician has recommended you make the following change in your medication:   START Farxiga  daily  *If you need a refill on your cardiac medications before your next appointment, please call your pharmacy*   Testing/Procedures:   Your cardiac CT will be scheduled at one of the below locations:   [facility] [address] [place], [place] [address] [phone]  OR  [facility] [address] [place], [place] [address] [phone]  OR   [facility] [address] [place], [place] [address] [phone]  If scheduled at [facility], please arrive at the [facility] and [facility] Entrance (Entrance C2) of [facility] 30 minutes prior to test start time. You can use the FREE valet parking offered at entrance C (encouraged to control the heart rate for the test)  Proceed to the [facility] Radiology Department (first floor) to check-in and test prep.  All radiology patients and guests should use entrance C2 at [facility], accessed from [facility], even though the hospital's physical address listed is [address]    If scheduled at [facility] or [facility], please arrive 15 mins early for check-in and test prep.  There is spacious parking and easy access to the radiology department from the [facility] entrance. Please enter here and check-in with the desk attendant.   Please follow these instructions carefully (unless otherwise directed):  An IV will be required for this test and Nitroglycerin  will be given.   On the Night Before the Test: Be sure to Drink plenty of water. Do not consume any caffeinated/decaffeinated beverages or chocolate 12 hours prior to your test. Do not take any antihistamines 12 hours prior to your test.  On the Day of the  Test: Drink plenty of water until 1 hour prior to the test. Do not eat any food 1 hour prior to test. You may take your regular medications prior to the test.  Take metoprolol  (Lopressor ) two hours prior to test. If you take Furosemide/Hydrochlorothiazide/Spironolactone/Chlorthalidone, please HOLD on the morning of the test. Patients who wear a continuous glucose monitor MUST remove the device prior to scanning. FEMALES- please wear underwire-free bra if available, avoid dresses & tight clothing  After the Test: Drink plenty of water. After receiving IV contrast, you may experience a mild flushed feeling. This is normal. On occasion, you may experience a mild rash up to 24 hours after the test. This is not dangerous. If this occurs, you can take Benadryl 25 mg and increase your fluid intake. If you experience trouble breathing, this can be serious. If it is severe call 911 IMMEDIATELY. If it is mild, please call our office.  We will call to schedule your test 2-4 weeks out understanding that some insurance companies will need an authorization prior to the service being performed.   For more information and frequently asked questions, please visit our website : [email or web address]  For non-scheduling related questions, please contact the cardiac imaging nurse navigator should you have any questions/concerns: Cardiac Imaging Nurse Navigators Direct Office Dial: [phone]   For scheduling needs, including cancellations and rescheduling, please call Brittany, [phone].   Follow-Up: At [facility], you and your health needs are our priority.  As part of our continuing mission to provide you with exceptional heart care, we have created designated Provider Care Teams.  These Care Teams include  your primary Cardiologist (physician) and Advanced Practice Providers (APPs -  Physician Assistants and Nurse Practitioners) who all work together to provide you with the care  you need, when you need it.  We recommend signing up for the patient portal called MyChart.  Sign up information is provided on this After Visit Summary.  MyChart is used to connect with patients for Virtual Visits (Telemedicine).  Patients are able to view lab/test results, encounter notes, upcoming appointments, etc.  Non-urgent messages can be sent to your provider as well.   To learn more about what you can do with MyChart, go to [email or web address].    Your next appointment:   Follow up with Reche Finder, NP in 6 weeks. Follow up with Dr. Raford in 4 months.

## 2023-06-07 NOTE — Progress Notes (Signed)
 I connected with Carla Patrick on 06/09/2023 at  9:30 AM EST by telephone and verified that I am speaking with the correct person using two identifiers.   I discussed the limitations, risks, security and privacy concerns of performing an evaluation and management service by telemedicine and the availability of in-person appointments. I also discussed with the patient that there may be a patient responsible charge related to this service. The patient expressed understanding and agreed to proceed.   Other persons participating in the visit and their role in the encounter: none   Patient's location: home  Provider's location: [facility]   Chief Complaint: follow up breast cancer    Patient Care Team: Clarice Nottingham, MD as PCP - General (Internal Medicine) Glean Stephane BROCKS, RN as Oncology Nurse Navigator Tyree Nanetta SAILOR, RN as Oncology Nurse Navigator Curvin Deward MOULD, MD as Consulting Physician (General Surgery) Lanny Callander, MD as Consulting Physician (Hematology) Shannon Agent, MD as Consulting Physician (Radiation Oncology) Burton, Lacie K, NP as Nurse Practitioner (Nurse Practitioner)  Clinic Day:  06/14/2023  Referring physician: Clarice Nottingham, MD  ASSESSMENT & PLAN:   Assessment & Plan: Malignant neoplasm of upper-outer quadrant of right breast in female, 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 Rutherford, confirmed she is postmenopausal. S/p D&C/hysteroscopy which showed benign endometrial polyp -[name] has recovered well, bleeding stopped. We discussed further anti-estrogen therapy.  -Due to her ER+ lobular histology and post-menopausal status, AI  was recommended. Due to her baseline arthritis (knees), I recommend Aromasin . We reviewed potential SE's, she agrees to try -recent labs reviewed, which shows moderate anemia while she had vaginal bleeding. It was recommended she take prenatal vitamin with iron for now -Screening MRI now scheduled for 05/20/2023. -Phone f/up in 3 months for AI tox check, then f/up in 6 months, or sooner if needed    The patient understands the plans discussed today and is in agreement with them.  She knows to contact our office if she develops concerns prior to her next appointment.  I provided 15 minutes of face-to-face time during this encounter and > 50% was spent counseling as documented under my assessment and plan.    [name], NP  [facility] - A DEPT OF [facility] [address] [place] [address] Dept: [phone] Dept Fax: [phone]   Orders Placed This Encounter  Procedures   MM 3D DIAGNOSTIC MAMMOGRAM BILATERAL BREAST    AET[employer] PF; 08/15/22 @[facility]  EPIC ORDER    Standing Status:   Future    Expected Date:   12/07/2023    Expiration Date:   06/08/2024    Reason for Exam (SYMPTOM  OR DIAGNOSIS REQUIRED):   breast cancer follow up    Is the patient pregnant?:   No    Preferred imaging [facility]      CHIEF COMPLAINT:  CC: Right breast cancer  Current Treatment: Exemestane  25 mg daily starting 02/12/2023.  INTERVAL HISTORY:  Carla Patrick is here today for repeat clinical assessment.She last saw Lacie, NP on 02/12/2023.  She was switched to aromatase inhibitor due to vaginal bleeding.  She states she is noninfected and not  taking it on routine basis.  This is something she states she needs to be more dedicated to in the future.  Breast MRI with and without contrast was done 05/20/2023.  MRI showed no evidence of new or recurrent breast carcinoma.  She does have benign postlumpectomy changes on the  right.  Next screening mammogram should be done 08/2023.  A new breast MRI should be done in December 2025. She denies chest pain, chest pressure, or shortness of breath. She denies headaches or visual disturbances. She denies abdominal pain, nausea, vomiting, or changes in bowel or bladder habits.  She denies fevers or chills. She denies pain. Her appetite is good.    I have reviewed the past medical history, past surgical history, social history and family history with the patient and they are unchanged from previous note.  ALLERGIES:  has no known allergies.  MEDICATIONS:  Current Outpatient Medications  Medication Sig Dispense Refill   benzonatate  (TESSALON ) 100 MG capsule Take 1 capsule (100 mg total) by mouth every 8 (eight) hours as needed for cough. 21 capsule 0   dapagliflozin  propanediol (FARXIGA ) 10 MG TABS tablet Take 1 tablet (10 mg total) by mouth daily before breakfast for 14 days. 14 tablet 0   dapagliflozin  propanediol (FARXIGA ) 10 MG TABS tablet Take 1 tablet (10 mg total) by mouth daily before breakfast. 30 tablet 5   exemestane  (AROMASIN ) 25 MG tablet Take 1 tablet (25 mg total) by mouth daily after breakfast. (Patient not taking: Reported on 06/02/2023) 30 tablet 6   furosemide (LASIX) 20 MG tablet Take 20 mg by mouth daily.     ibuprofen  (ADVIL ) 800 MG tablet Take 1 tablet (800 mg total) by mouth every 8 (eight) hours as needed. (Patient not taking: Reported on 06/02/2023) 30 tablet 11   metoprolol  succinate (TOPROL -XL) 50 MG 24 hr tablet Take 1 tablet (50 mg total) by mouth daily. Take with or immediately following a meal. 90 tablet 3   metoprolol  tartrate (LOPRESSOR ) 100 MG tablet Take 1 tablet (100 mg total) by mouth once for 1 dose. Please take 2 hours prior to cardiac CT. 1 tablet 0   OZEMPIC , 1 MG/DOSE, 4 MG/3ML SOPN Inject 1 mg as directed once a week.     potassium chloride SA (KLOR-CON M) 20 MEQ tablet Take 20 mEq by mouth 2 (two) times daily.     PROAIR  HFA 108 (90  BASE) MCG/ACT inhaler      rosuvastatin  (CRESTOR ) 20 MG tablet Take 1 tablet (20 mg total) by mouth daily. 90 tablet 3   SYMBICORT  160-4.5 MCG/ACT inhaler      telmisartan  (MICARDIS ) 40 MG tablet Take 1 tablet (40 mg total) by mouth daily. 90 tablet 1   tirzepatide (MOUNJARO) 2.5 MG/0.5ML Pen Inject 2.5 mg into the skin once a week.     No current facility-administered medications for this visit.    HISTORY OF PRESENT ILLNESS:   Oncology History Overview Note   Cancer Staging  Malignant neoplasm of upper-outer quadrant of right breast in female, estrogen receptor positive (HCC) Staging form: Breast, AJCC 8th Edition - Clinical stage from 12/26/2020: Stage IA (cT1b, cN0, cM0, G2, ER+, PR-, HER2-) - Unsigned Stage prefix: Initial diagnosis Histologic grading system: 3 grade system Staged by: Pathologist and managing physician Stage used in treatment planning: Yes National guidelines used in treatment planning: Yes Type of national guideline used in treatment planning: NCCN - Pathologic stage from 01/12/2021: Stage IA (pT1c, pN0(i+), cM0, G2, ER+, PR-, HER2-, Oncotype  DX score: 22) - Signed by Lanny Callander, MD on 04/27/2021 Stage prefix: Initial diagnosis Multigene prognostic tests performed: Oncotype DX Recurrence score range: Greater than or equal to 11 Histologic grading system: 3 grade system Residual tumor (R): R0 - None    Malignant neoplasm of upper-outer quadrant of right breast in female, estrogen receptor positive (HCC)  12/05/2020 Mammogram   Right Breast Diagnostic Mammogram; Right Breast Ultrasound  IMPRESSION: The 0.4 x 0.5 x 0.7 cm taller-than-wide irregular mass in the right breast is suspicious of malignancy. No significant abnormalities were seen sonographically in the right axilla.   12/17/2020 Pathology Results   Diagnosis:  Breast, right, needle core biopsy, 12 O'CLOCK middle depth, 9 cmfn - INVASIVE MAMMARY CARCINOMA - MAMMARY CARCINOMA IN SITU  The carcinoma  appears Nottingham grade 2 of 3. E-cadherin is negative in the invasive carcinoma, supporting lobular origin.  PROGNOSTIC INDICATORS Results: IMMUNOHISTOCHEMICAL AND MORPHOMETRIC ANALYSIS PERFORMED MANUALLY The tumor cells are EQUIVOCAL for Her2 (2+). Her2 by FISH will be performed and results reported separately. Estrogen Receptor: 90%, POSITIVE, MODERATE STAINING INTENSITY Progesterone Receptor: 0%, NEGATIVE Proliferation Marker Ki67: 1%  FLUORESCENCE IN-SITU HYBRIDIZATION Results: GROUP 5: HER 2 **NEGATIVE** Equivocal form of amplification of the HER2 gene was detected in the IHC 2+ tissue sample received from this individual. HER2 FISH was performed by a technologist and cell imaging and analysis on the [employer]. RATIO OF ER2/CEN17 SIGNALS 1.15 AVERAGE HER2 COPY NUMBER PER CELL 1.95   12/24/2020 Initial Diagnosis   Malignant neoplasm of upper-outer quadrant of right breast in female, estrogen receptor positive (HCC)   01/12/2021 Cancer Staging   Staging form: Breast, AJCC 8th Edition - Pathologic stage from 01/12/2021: Stage IA (pT1c, pN0(i+), cM0, G2, ER+, PR-, HER2-, Oncotype DX score: 22) - Signed by Lanny Callander, MD on 04/27/2021 Stage prefix: Initial diagnosis Multigene prognostic tests performed: Oncotype DX Recurrence score range: Greater than or equal to 11 Histologic grading system: 3 grade system Residual tumor (R): R0 - None   01/30/2021 Definitive Surgery   FINAL MICROSCOPIC DIAGNOSIS:   A. LYMPH NODE, RIGHT AXILLARY #1, SENTINEL, EXCISION:  -  Isolated tumor cells identified in one lymph node (0i/1)  -  See comment   B. LYMPH NODE, RIGHT AXILLARY, SENTINEL, EXCISION:  -  No carcinoma identified in one lymph node (0/1)  -  See comment   C. LYMPH NODE, RIGHT AXILLARY #2, SENTINEL, EXCISION:  -  No carcinoma identified in one lymph node (0/1)  -  See comment   D. LYMPH NODE, RIGHT AXILLARY, SENTINEL, EXCISION:  -  No carcinoma identified in one lymph node (0/1)  -   See comment   E. LYMPH NODE, RIGHT AXILLARY #3, SENTINEL, EXCISION:  -  No carcinoma identified in one lymph node (0/1)  -  See comment   F. LYMPH NODE, RIGHT AXILLARY #4, SENTINEL, EXCISION:  -  No carcinoma identified in one lymph node (0/1)  -  See comment   G. LYMPH NODE, RIGHT AXILLARY, SENTINEL, EXCISION:  -  No carcinoma identified in one lymph node (0/1)  -  See comment   H. LYMPH NODE, RIGHT AXILLARY #5, SENTINEL, EXCISION:  -  Benign fibroadipose tissue  -  No lymphoid tissue identified   I. BREAST, RIGHT, LUMPECTOMY:  -  Invasive lobular carcinoma, Nottingham grade 2 of 3, 1.5 cm  -  Lobular neoplasia (atypical lobular hyperplasia)  -  Margins uninvolved by carcinoma (0.7 cm; inferior margin)  -  Previous biopsy site changes  present  -  See oncology table and comment below    01/30/2021 Miscellaneous   Oncotype Recurrence Score of 22, risk of distant metastasis is 8% with antiestrogen therapy alone.   03/18/2021 - 04/15/2021 Radiation Therapy   Site Technique Total Dose (Gy) Dose per Fx (Gy) Completed Fx Beam Energies  Breast, Right: Breast_Rt 3D 40.05/40.05 2.67 15/15 10X  Breast, Right: Breast_Rt_Bst 3D 10/10 2 5/5 6X, 10X     05/2021 - 08/2022 Anti-estrogen oral therapy   Took low dose Tamoxifen  intermittently, stopped 08/2022. Did not tolerate full dose due to SE's. After she stopped, pt developed abnormal vaginal bleeding.    12/16/2022 Survivorship   SCP delivered by Lacie Burton, NP   05/20/2023 Imaging   Bilateral breast MRI with and without contrast IMPRESSION: 1. No evidence of new or recurrent breast carcinoma. 2. Benign post lumpectomy changes on the right  RECOMMENDATION: 1. Annual screening mammography, last bilateral exam performed on 08/15/2022. 2. Consider supplemental high risk screening breast MRI given patient's personal history breast malignancy.  BI-RADS CATEGORY  2: Benign.       REVIEW OF SYSTEMS:   Constitutional: Denies fevers,  chills or abnormal weight loss Eyes: Denies blurriness of vision Ears, nose, mouth, throat, and face: Denies mucositis or sore throat Respiratory: Denies cough, dyspnea or wheezes Cardiovascular: Denies palpitation, chest discomfort or lower extremity swelling Gastrointestinal:  Denies nausea, heartburn or change in bowel habits Skin: Denies abnormal skin rashes Lymphatics: Denies new lymphadenopathy or easy bruising Neurological:Denies numbness, tingling or new weaknesses Behavioral/Psych: Mood is stable, no new changes  All other systems were reviewed with the patient and are negative.   VITALS:  There were no vitals taken for this visit.  Wt Readings from Last 3 Encounters:  06/02/23 210 lb 9.6 oz (95.5 kg)  02/12/23 210 lb 11.2 oz (95.6 kg)  02/12/23 209 lb 11.2 oz (95.1 kg)    There is no height or weight on file to calculate BMI.  Performance status (ECOG): 0 - Asymptomatic  LABORATORY DATA:  I have reviewed the data as listed    Component Value Date/Time   NA 139 01/09/2023 0909   NA 144 09/20/2021 1122   K 3.8 01/09/2023 0909   CL 104 01/09/2023 0909   CO2 30 12/16/2022 0941   GLUCOSE 137 (H) 01/09/2023 0909   BUN 14 01/09/2023 0909   BUN 10 09/20/2021 1122   CREATININE 0.70 01/09/2023 0909   CREATININE 0.74 12/16/2022 0941   CALCIUM  9.4 12/16/2022 0941   PROT 6.8 12/16/2022 0941   PROT 6.7 09/20/2021 1122   ALBUMIN 4.1 12/16/2022 0941   ALBUMIN 4.4 09/20/2021 1122   AST 10 (L) 12/16/2022 0941   ALT 19 12/16/2022 0941   ALKPHOS 78 12/16/2022 0941   BILITOT 0.8 12/16/2022 0941   GFRNONAA >60 12/16/2022 0941   GFRAA >90 10/05/2011 0443     Lab Results  Component Value Date   WBC 7.8 01/09/2023   NEUTROABS 5.2 12/16/2022   HGB 10.2 (L) 01/09/2023   HCT 30.0 (L) 01/09/2023   MCV 99.3 01/09/2023   PLT 420 (H) 01/09/2023     RADIOGRAPHIC STUDIES: CT CORONARY MORPH W/CTA COR W/SCORE W/CA W/CM &/OR WO/CM Result Date: 06/10/2023 CLINICAL DATA:  Chest  pain EXAM: Cardiac/Coronary CTA TECHNIQUE: A non-contrast, gated CT scan was obtained with axial slices of 3 mm through the heart for calcium  scoring. Calcium  scoring was performed using the Agatston method. A 120 kV prospective, gated, contrast cardiac scan was obtained.  Gantry rotation speed was 250 msecs and collimation was 0.6 mm. Two sublingual nitroglycerin  tablets (0.8 mg) were given. The 3D data set was reconstructed in 5% intervals of the 35-75% of the R-R cycle. Diastolic phases were analyzed on a dedicated workstation using MPR, MIP, and VRT modes. The patient received 95 cc of contrast. FINDINGS: Image quality: Excellent. Noise artifact is: Limited. Coronary Arteries:  Normal coronary origin.  Right dominance. Left main: The left main is a large caliber vessel with a normal take off from the left coronary cusp that trifurcates into a LAD, LCX, and ramus intermedius. There is no plaque or stenosis. Left anterior descending artery: The LAD is patent without evidence of plaque or stenosis. The LAD gives off 2 patent diagonal branches. Ramus intermedius: Patent with no evidence of plaque or stenosis. Left circumflex artery: The LCX is non-dominant and patent with no evidence of plaque or stenosis. The LCX gives off 2 patent obtuse marginal branches. Right coronary artery: The RCA is dominant with normal take off from the right coronary cusp. There is no evidence of plaque or stenosis. The RCA terminates as a PDA and right posterolateral branch without evidence of plaque or stenosis. Right Atrium: Right atrial size is within normal limits. Right Ventricle: The right ventricular cavity is within normal limits. Left Atrium: Left atrial size is normal in size with no left atrial appendage filling defect. Left Ventricle: The ventricular cavity size is within normal limits. Pulmonary arteries: Normal in size. Pulmonary veins: Normal pulmonary venous drainage. Pericardium: Normal thickness without significant  effusion or calcium  present. Cardiac valves: The aortic valve is trileaflet without significant calcification. The mitral valve is normal without significant calcification. Aorta: Normal caliber without significant disease. Extra-cardiac findings: See attached radiology report for non-cardiac structures. IMPRESSION: 1. Coronary calcium  score of 0. This was 0 percentile for age-, sex, and race-matched controls. 2. Total plaque volume 0 mm3 which is 0 percentile for age- and sex-matched controls (calcified plaque 0 mm3; non-calcified plaque 0 mm3). TPV is none. 3. Normal coronary origin with right dominance. 4. Normal coronary arteries. 5. Consider non atherosclerotic causes of chest pain. RECOMMENDATIONS: 1. CAD-RADS 0: No evidence of CAD (0%). Consider non-atherosclerotic causes of chest pain. 2. CAD-RADS 1: Minimal non-obstructive CAD (0-24%). Consider non-atherosclerotic causes of chest pain. Consider preventive therapy and risk factor modification. 3. CAD-RADS 2: Mild non-obstructive CAD (25-49%). Consider non-atherosclerotic causes of chest pain. Consider preventive therapy and risk factor modification. 4. CAD-RADS 3: Moderate stenosis. Consider symptom-guided anti-ischemic pharmacotherapy as well as risk factor modification per guideline directed care. Additional analysis with CT FFR will be submitted. 5. CAD-RADS 4: Severe stenosis. (70-99% or > 50% left main). Cardiac catheterization or CT FFR is recommended. Consider symptom-guided anti-ischemic pharmacotherapy as well as risk factor modification per guideline directed care. Invasive coronary angiography recommended with revascularization per published guideline statements. 6. CAD-RADS 5: Total coronary occlusion (100%). Consider cardiac catheterization or viability assessment. Consider symptom-guided anti-ischemic pharmacotherapy as well as risk factor modification per guideline directed care. 7. CAD-RADS N: Non-diagnostic study. Obstructive CAD can't be  excluded. Alternative evaluation is recommended. Wilbert Bihari, MD Electronically Signed   By: Wilbert Bihari M.D.   On: 06/10/2023 11:58   MR BREAST BILATERAL W WO CONTRAST INC CAD Result Date: 05/21/2023 CLINICAL DATA:  History of right breast lumpectomy with adjuvant radiation therapy for invasive lobular carcinoma. High risk supplemental screening. EXAM: BILATERAL BREAST MRI WITH AND WITHOUT CONTRAST TECHNIQUE: Multiplanar, multisequence MR images of both breasts were obtained  prior to and following the intravenous administration of 10 ml of Vueway . Three-dimensional MR images were rendered by post-processing of the original MR data on an independent workstation. The three-dimensional MR images were interpreted, and findings are reported in the following complete MRI report for this study. Three dimensional images were evaluated at the independent interpreting workstation using the DynaCAD thin client. COMPARISON:  Prior exams in a prior breast MRI dated 01/07/2021. FINDINGS: Breast composition: b. Scattered fibroglandular tissue. Background parenchymal enhancement: Mild Right breast: There are post lumpectomy changes. In the posterior upper breast there is postsurgical architectural distortion, susceptibility artifact and a small focus of enhancement, low level with progressive enhancement characteristics and spanning approximately 1.3 cm. This is consistent with benign postoperative enhancement. There is also susceptibility artifact in the retroareolar breast consistent with previous Mag trace injection. There are no breast masses. There are no areas of abnormal enhancement suspicious for malignancy. Left breast: No mass or abnormal enhancement. Lymph nodes: No abnormal appearing lymph nodes. Ancillary findings:  None. IMPRESSION: 1. No evidence of new or recurrent breast carcinoma. 2. Benign post lumpectomy changes on the right RECOMMENDATION: 1. Annual screening mammography, last bilateral exam performed  on 08/15/2022. 2. Consider supplemental high risk screening breast MRI given patient's personal history breast malignancy. BI-RADS CATEGORY  2: Benign. Electronically Signed   By: Alm Parkins M.D.   On: 05/21/2023 07:31

## 2023-06-07 NOTE — Assessment & Plan Note (Addendum)
 stage IA right breast invasive lobular carcinoma, ER+/PR-/HER2- pT1bN0iM0 -Diagnosed in 11/2020, s/p lumpectomy and adjuvant radiation therapy -She took some anti-estrogen therapy with Tamoxifen  intermittently from 05/2021 - 08/2022; tolerated moderately well with fatigue but she stopped due to vaginal bleeding -I reviewed the case with ob/gyn Dr. Rutherford, confirmed she is postmenopausal. S/p D&C/hysteroscopy which showed benign endometrial polyp -Ms. Ventola has recovered well, bleeding stopped. We discussed further anti-estrogen therapy.  -Due to her ER+ lobular histology and post-menopausal status, AI was recommended. Due to her baseline arthritis (knees), I recommend Aromasin . We reviewed potential SE's, she agrees to try -recent labs reviewed, which shows moderate anemia while she had vaginal bleeding. It was recommended she take prenatal vitamin with iron for now -Screening MRI done on 05/20/2023 was benign.  She will have 3D screening mammogram in July 2025. -She is on a difficult time starting exemestane .  Has had some cardiac type concerns and has been seeing cardiology on routine basis.  With is going on, has been difficult to remember to take the exemestane .  Plans to do better with this in the near future.

## 2023-06-09 ENCOUNTER — Encounter (HOSPITAL_COMMUNITY): Payer: Self-pay

## 2023-06-09 ENCOUNTER — Encounter: Payer: Self-pay | Admitting: Nurse Practitioner

## 2023-06-09 ENCOUNTER — Inpatient Hospital Stay: Payer: 59 | Attending: Nurse Practitioner | Admitting: Nurse Practitioner

## 2023-06-09 DIAGNOSIS — Z170 Estrogen receptor positive status [ER+]: Secondary | ICD-10-CM | POA: Diagnosis not present

## 2023-06-09 DIAGNOSIS — C50411 Malignant neoplasm of upper-outer quadrant of right female breast: Secondary | ICD-10-CM | POA: Diagnosis not present

## 2023-06-10 ENCOUNTER — Ambulatory Visit (HOSPITAL_COMMUNITY)
Admission: RE | Admit: 2023-06-10 | Discharge: 2023-06-10 | Disposition: A | Discharge status: Home / Self Care | Payer: 59 | Source: Ambulatory Visit | Attending: Family | Admitting: Family

## 2023-06-10 DIAGNOSIS — R072 Precordial pain: Secondary | ICD-10-CM | POA: Insufficient documentation

## 2023-06-10 MED ORDER — METOPROLOL TARTRATE 5 MG/5ML IV SOLN: 10.0000 mg | Freq: Once | INTRAVENOUS | Status: DC | PRN

## 2023-06-10 MED ORDER — NITROGLYCERIN 0.4 MG SL SUBL
SUBLINGUAL_TABLET | SUBLINGUAL | Status: AC
  Filled 2023-06-10: qty 2

## 2023-06-10 MED ORDER — DILTIAZEM HCL 25 MG/5ML IV SOLN: 10.0000 mg | INTRAVENOUS | Status: DC | PRN

## 2023-06-10 MED ORDER — METOPROLOL TARTRATE 5 MG/5ML IV SOLN
INTRAVENOUS | Status: AC
  Filled 2023-06-10: qty 5

## 2023-06-10 MED ORDER — NITROGLYCERIN 0.4 MG SL SUBL
0.8000 mg | SUBLINGUAL_TABLET | Freq: Once | SUBLINGUAL | Status: AC
  Administered 2023-06-10: 0.8 mg via SUBLINGUAL

## 2023-06-10 MED ORDER — IOHEXOL 350 MG/ML SOLN
95.0000 mL | Freq: Once | INTRAVENOUS | Status: AC | PRN
  Administered 2023-06-10: 95 mL via INTRAVENOUS

## 2023-06-11 ENCOUNTER — Encounter (HOSPITAL_BASED_OUTPATIENT_CLINIC_OR_DEPARTMENT_OTHER): Payer: Self-pay

## 2023-06-11 ENCOUNTER — Telehealth: Payer: Self-pay | Admitting: Nurse Practitioner

## 2023-06-11 NOTE — Telephone Encounter (Signed)
 Left patient a voicemail in regards to future scheduled appointments; left callback if needed for scheduling

## 2023-06-14 ENCOUNTER — Encounter: Payer: Self-pay | Admitting: Nurse Practitioner

## 2023-06-24 ENCOUNTER — Ambulatory Visit (INDEPENDENT_AMBULATORY_CARE_PROVIDER_SITE_OTHER): Payer: 59 | Admitting: Pulmonary Disease

## 2023-06-24 ENCOUNTER — Encounter: Payer: Self-pay | Admitting: Pulmonary Disease

## 2023-06-24 ENCOUNTER — Telehealth (HOSPITAL_BASED_OUTPATIENT_CLINIC_OR_DEPARTMENT_OTHER): Payer: Self-pay

## 2023-06-24 VITALS — BP 128/80 | HR 83 | Temp 99.3°F | Ht 59.0 in | Wt 204.4 lb

## 2023-06-24 DIAGNOSIS — I5022 Chronic systolic (congestive) heart failure: Secondary | ICD-10-CM | POA: Diagnosis not present

## 2023-06-24 DIAGNOSIS — I272 Pulmonary hypertension, unspecified: Secondary | ICD-10-CM

## 2023-06-24 DIAGNOSIS — R918 Other nonspecific abnormal finding of lung field: Secondary | ICD-10-CM

## 2023-06-24 NOTE — Progress Notes (Signed)
Synopsis: Referred in January 2025 for pulmonary hypertension  Subjective:   PATIENT ID: Carla Patrick GENDER: female DOB: 09/08/1965, MRN: [id number]  HPI  Chief Complaint  Patient presents with   Consult   Carla Patrick is a 58 year old woman, never smoker with history of hypertension, obesity, breast cancer and chronic systolic heart failure who is referred to pulmonary clinic for evaluation of pulmonary hypertension.   The patient, a long-standing asthmatic, presented with a chief complaint of persistent shortness of breath. The onset of symptoms was associated with a severe respiratory tract infection in November, which was treated with two courses of prednisone and an antibiotic. Despite treatment, the patient reported persistent breathing difficulties. An X-ray revealed possible fluid and an enlarged heart, prompting further investigation. An ultrasound of the heart indicated that one valve was not pumping effectively, with an estimated function of 40%. A subsequent CT scan to examine the arteries was reportedly unremarkable.  The patient reported feeling better but not back to their normal self since starting several medications to improve heart function. They described an inability to take a deep breath and a sensation of something rattling in their chest. The patient also reported a history of irregular heartbeats and arrhythmia, which had been monitored previously.  The patient's asthma, diagnosed in childhood, was managed as needed with inhalers. However, since the infection, the patient reported using their Symbicort inhaler almost daily, a significant increase from their usual usage. The patient also reported a feeling of pressure in their chest, rapid heartbeats, and occasional leg swelling.  The patient denied any history of smoking or significant dust or chemical exposures. They reported a family history of asthma, with their mother having chronic asthma. The patient had  a dog for about ten years, which recently passed away.  The patient also reported arthritis, mainly in the knees, with occasional hand stiffness, particularly in the morning. They denied any significant heartburn, reflux, or sinus congestion, but reported snoring during sleep. The patient also reported some weight gain over the years, which they were attempting to manage with medication and increased physical activity.   Past Medical History:  Diagnosis Date   Abnormal EKG    Acute sinusitis    Allergic rhinitis    Anemia    Asthma    Cancer (HCC) 11/2020   right breast [facility]   Depression    Elevated blood pressure reading    Essential hypertension 02/12/2023   Heart murmur    mild no cardiologist   History of carpal tunnel release    History of radiation therapy    Right Breast- 03/18/21-04/15/21-Dr. Antony Blackbird   Hyperlipidemia    Hypertension    Morbid obesity (HCC) 02/12/2023   Morbid obesity due to excess calories (HCC)    Obese    Osteoarthritis    Palpitations 02/12/2023   PMB (postmenopausal bleeding)    Pre-diabetes    Pure hypercholesterolemia 02/12/2023   Sciatica of left side    Sinus infection    Thrombocytosis    Wears glasses      Family History  Problem Relation Age of Onset   Hypertension Mother    Asthma Mother    Diabetes Mother    HIV Father    Heart attack Maternal Grandmother 40   Heart disease Maternal Grandmother    Diabetes Maternal Grandfather    Diabetes Paternal Grandmother      Social History   Socioeconomic History   Marital status: Married  Spouse name: Not on file   Number of children: 0   Years of education: Not on file   Highest education level: Not on file  Occupational History   Not on file  Tobacco Use   Smoking status: Never   Smokeless tobacco: Never  Vaping Use   Vaping status: Never Used  Substance and Sexual Activity   Alcohol use: Yes    Comment: rare glass of wine   Drug use: No   Sexual activity: Yes     Birth control/protection: None  Other Topics Concern   Not on file  Social History Narrative   Not on file   Social Drivers of Health   Financial Resource Strain: Not on file  Food Insecurity: No Food Insecurity (02/12/2023)   Hunger Vital Sign    Worried About Running Out of Food in the Last Year: Never true    Ran Out of Food in the Last Year: Never true  Transportation Needs: No Transportation Needs (02/12/2023)   PRAPARE - Administrator, Civil Service (Medical): No    Lack of Transportation (Non-Medical): No  Physical Activity: Inactive (02/12/2023)   Exercise Vital Sign    Days of Exercise per Week: 0 days    Minutes of Exercise per Session: 0 min  Stress: Not on file  Social Connections: Unknown (10/15/2021)   Received from [facility], [facility]   Social Network    Social Network: Not on file  Intimate Partner Violence: Unknown (09/06/2021)   Received from [facility], [facility]   HITS    Physically Hurt: Not on file    Insult or Talk Down To: Not on file    Threaten Physical Harm: Not on file    Scream or Curse: Not on file     No Known Allergies   Outpatient Medications Prior to Visit  Medication Sig Dispense Refill   dapagliflozin propanediol (FARXIGA) 10 MG TABS tablet Take 1 tablet (10 mg total) by mouth daily before breakfast. 30 tablet 5   exemestane (AROMASIN) 25 MG tablet Take 1 tablet (25 mg total) by mouth daily after breakfast. 30 tablet 6   furosemide (LASIX) 20 MG tablet Take 20 mg by mouth daily.     ibuprofen (ADVIL) 800 MG tablet Take 1 tablet (800 mg total) by mouth every 8 (eight) hours as needed. 30 tablet 11   metoprolol succinate (TOPROL-XL) 50 MG 24 hr tablet Take 1 tablet (50 mg total) by mouth daily. Take with or immediately following a meal. 90 tablet 3   potassium chloride SA (KLOR-CON M) 20 MEQ tablet Take 20 mEq by mouth 2 (two) times daily.     PROAIR HFA 108 (90 BASE) MCG/ACT inhaler      rosuvastatin (CRESTOR) 20  MG tablet Take 1 tablet (20 mg total) by mouth daily. 90 tablet 3   telmisartan (MICARDIS) 40 MG tablet Take 1 tablet (40 mg total) by mouth daily. 90 tablet 1   tirzepatide (MOUNJARO) 2.5 MG/0.5ML Pen Inject 2.5 mg into the skin once a week.     benzonatate (TESSALON) 100 MG capsule Take 1 capsule (100 mg total) by mouth every 8 (eight) hours as needed for cough. (Patient not taking: Reported on 06/24/2023) 21 capsule 0   metoprolol tartrate (LOPRESSOR) 100 MG tablet Take 1 tablet (100 mg total) by mouth once for 1 dose. Please take 2 hours prior to cardiac CT. 1 tablet 0   OZEMPIC, 1 MG/DOSE, 4 MG/3ML SOPN Inject 1  mg as directed once a week. (Patient not taking: Reported on 06/24/2023)     SYMBICORT 160-4.5 MCG/ACT inhaler  (Patient not taking: Reported on 06/24/2023)     No facility-administered medications prior to visit.    Review of Systems  Constitutional:  Negative for chills, fever, malaise/fatigue and weight loss.  HENT:  Negative for congestion, sinus pain and sore throat.   Eyes: Negative.   Respiratory:  Positive for shortness of breath. Negative for cough, hemoptysis, sputum production and wheezing.   Cardiovascular:  Positive for chest pain (pressure), palpitations and orthopnea. Negative for claudication and leg swelling.  Gastrointestinal:  Negative for abdominal pain, heartburn, nausea and vomiting.  Genitourinary: Negative.   Musculoskeletal:  Negative for joint pain and myalgias.  Skin:  Negative for rash.  Neurological:  Negative for weakness.  Endo/Heme/Allergies: Negative.   Psychiatric/Behavioral: Negative.        Objective:   Vitals:   06/24/23 1016  BP: 128/80  Pulse: 83  Temp: 99.3 F (37.4 C)  TempSrc: Oral  SpO2: 97%  Weight: 204 lb 6.4 oz (92.7 kg)  Height: 4\' 11"  (1.499 m)   Physical Exam Constitutional:      General: She is not in acute distress.    Appearance: Normal appearance. She is obese.  Eyes:     General: No scleral icterus.     Conjunctiva/sclera: Conjunctivae normal.  Cardiovascular:     Rate and Rhythm: Normal rate and regular rhythm.  Pulmonary:     Breath sounds: No wheezing, rhonchi or rales.  Musculoskeletal:     Right lower leg: No edema.     Left lower leg: No edema.  Skin:    General: Skin is warm and dry.  Neurological:     General: No focal deficit present.    CBC    Component Value Date/Time   WBC 7.8 01/09/2023 0902   RBC 2.99 (L) 01/09/2023 0902   HGB 10.2 (L) 01/09/2023 0909   HGB 11.9 (L) 12/16/2022 0941   HGB 12.1 09/20/2021 1122   HCT 30.0 (L) 01/09/2023 0909   HCT 36.5 09/20/2021 1122   PLT 420 (H) 01/09/2023 0902   PLT 341 12/16/2022 0941   PLT 333 09/20/2021 1122   MCV 99.3 01/09/2023 0902   MCV 96 09/20/2021 1122   MCH 31.4 01/09/2023 0902   MCHC 31.6 01/09/2023 0902   RDW 13.3 01/09/2023 0902   RDW 12.5 09/20/2021 1122   LYMPHSABS 2.3 12/16/2022 0941   LYMPHSABS 1.7 09/20/2021 1122   MONOABS 0.7 12/16/2022 0941   EOSABS 0.2 12/16/2022 0941   EOSABS 0.1 09/20/2021 1122   BASOSABS 0.0 12/16/2022 0941   BASOSABS 0.0 09/20/2021 1122      Latest Ref Rng & Units 01/09/2023    9:09 AM 12/16/2022    9:41 AM 11/20/2021   11:48 AM  BMP  Glucose 70 - 99 mg/dL 161  096  91   BUN 6 - 20 mg/dL 14  8  12    Creatinine 0.44 - 1.00 mg/dL 0.45  4.09  8.11   Sodium 135 - 145 mmol/L 139  141  141   Potassium 3.5 - 5.1 mmol/L 3.8  3.9  3.9   Chloride 98 - 111 mmol/L 104  105  106   CO2 22 - 32 mmol/L  30  30   Calcium 8.9 - 10.3 mg/dL  9.4  9.7    Chest imaging: CT Chest 08/2021 Cardiovascular: Limited evaluation in the absence of intravenous contrast. Two vessel  aortic arch. The right brachiocephalic and left common carotid artery share a common origin. Trace atherosclerotic calcifications along the ascending thoracic aorta. No aneurysm. Main pulmonary artery is normal in size. The heart is normal in size. No pericardial effusion.   Mediastinum/Nodes: Unremarkable CT appearance  of the thyroid gland. No suspicious mediastinal or hilar adenopathy. No soft tissue mediastinal mass. The thoracic esophagus is unremarkable.   Lungs/Pleura: 4 mm right middle lobe pulmonary nodule (image 78 series 8). 3 mm left lower lobe pulmonary nodule (image 100 series 8). Based upon reported findings on the prior examination, these nodules demonstrate no significant interval change. The lungs are otherwise clear.  PFT:     No data to display          Labs:  Path:  Echo 05/13/23: LV EF 41%. Indeterminate diastolic filling pattern. Normal RV size and Function. RVSP . LA is mildly dilated. Mild aortic valve regurgitation. Mild mitral valve regurgitation. Mild mitral valve Heart Catheterization:    Assessment & Plan:   Pulmonary hypertension (HCC) - Plan: ANA, Anti-scleroderma antibody, Rheumatoid factor, RNP Antibodies, Cyclic citrul peptide antibody, IgG, Sjogren's syndrome antibods(ssa + ssb), Anti-Smith antibody, Centromere Antibodies, ANCA screen with reflex titer, HIV Antibody (routine testing w rflx), Pulmonary Function Test, Pulmonary Function Test, ANA, Anti-scleroderma antibody, RNP Antibodies, Cyclic citrul peptide antibody, IgG, Sjogren's syndrome antibods(ssa + ssb), Anti-Smith antibody, ANCA screen with reflex titer, HIV Antibody (routine testing w rflx), Centromere Antibodies, Rheumatoid factor  Chronic systolic heart failure (HCC)  Discussion: Carla Patrick is a 58 year old woman, never smoker with history of hypertension, obesity, breast cancer and chronic systolic heart failure who is referred to pulmonary clinic for evaluation of pulmonary hypertension.   Suspected Pulmonary Hypertension Echocardiogram showed elevated estimated pulmonary artery pressure . No significant valvular disease. Patient has orthopnea. Less concern for group 1 disease. Concern for group 2 and group 3 disease.  -She is to complete sleep study ordered by her cardiology team   -Check pulmonary function tests - Check HIV ab, ANA, ANCA, RF, CCP, Anti smith Ab, SSA/SSB ab -Consider right heart catheterization depending on results of sleep study and pulmonary function tests.  Moderate Persistent Asthma History of asthma since childhood.  -Continue Symbicort 2 puffs BID and ProAir as needed. -Consider switching to Breztri inhaler if Symbicort is not providing sufficient relief. -Order pulmonary function tests to evaluate for COPD.  Arthritis Complaints of arthritis mainly in knees and occasional stiffness in hands, particularly in the morning. -Monitor  General Health Maintenance -Encourage weight loss   Follow-up in 3 months.  Melody Comas, MD [facility] Office: [phone]   Current Outpatient Medications:    dapagliflozin propanediol (FARXIGA) 10 MG TABS tablet, Take 1 tablet (10 mg total) by mouth daily before breakfast., Disp: 30 tablet, Rfl: 5   exemestane (AROMASIN) 25 MG tablet, Take 1 tablet (25 mg total) by mouth daily after breakfast., Disp: 30 tablet, Rfl: 6   furosemide (LASIX) 20 MG tablet, Take 20 mg by mouth daily., Disp: , Rfl:    ibuprofen (ADVIL) 800 MG tablet, Take 1 tablet (800 mg total) by mouth every 8 (eight) hours as needed., Disp: 30 tablet, Rfl: 11   metoprolol succinate (TOPROL-XL) 50 MG 24 hr tablet, Take 1 tablet (50 mg total) by mouth daily. Take with or immediately following a meal., Disp: 90 tablet, Rfl: 3   potassium chloride SA (KLOR-CON M) 20 MEQ tablet, Take 20 mEq by mouth 2 (two) times daily., Disp: ,  Rfl:    PROAIR HFA 108 (90 BASE) MCG/ACT inhaler, , Disp: , Rfl:    rosuvastatin (CRESTOR) 20 MG tablet, Take 1 tablet (20 mg total) by mouth daily., Disp: 90 tablet, Rfl: 3   telmisartan (MICARDIS) 40 MG tablet, Take 1 tablet (40 mg total) by mouth daily., Disp: 90 tablet, Rfl: 1   tirzepatide (MOUNJARO) 2.5 MG/0.5ML Pen, Inject 2.5 mg into the skin once a week., Disp: , Rfl:    benzonatate  (TESSALON) 100 MG capsule, Take 1 capsule (100 mg total) by mouth every 8 (eight) hours as needed for cough. (Patient not taking: Reported on 06/24/2023), Disp: 21 capsule, Rfl: 0   metoprolol tartrate (LOPRESSOR) 100 MG tablet, Take 1 tablet (100 mg total) by mouth once for 1 dose. Please take 2 hours prior to cardiac CT., Disp: 1 tablet, Rfl: 0   OZEMPIC, 1 MG/DOSE, 4 MG/3ML SOPN, Inject 1 mg as directed once a week. (Patient not taking: Reported on 06/24/2023), Disp: , Rfl:    SYMBICORT 160-4.5 MCG/ACT inhaler, , Disp: , Rfl:

## 2023-06-24 NOTE — Patient Instructions (Addendum)
Recommend completing your home sleep study  We will check inflammatory labs today and HIV antibody  Use symbicort inhaler 2 puffs twice daily - rinse mouth out after each usee   We can consider trial of Breztri inhaler if symbicort is not helping with your symptoms  We will schedule you for pulmonary function tests at the earliest possible appointment time  Follow up in 3 months, call sooner if symptoms worsen

## 2023-06-24 NOTE — Telephone Encounter (Signed)
Called and spoke to pt; discussed CT results. She verbalized understanding. Repeat CT ordered.  ----- Message from Alver Sorrow sent at 06/18/2023  7:08 PM EST ----- Radiology read of CT scan showed stable bilateral pulmonary nodules less than 1cm in size. Given history of breast cancer, would recommended chest CT without contrast in 1 year for monitoring.

## 2023-06-24 NOTE — Telephone Encounter (Signed)
-----   Message from Alver Sorrow sent at 06/18/2023  7:08 PM EST ----- Radiology read of CT scan showed stable bilateral pulmonary nodules less than 1cm in size. Given history of breast cancer, would recommended chest CT without contrast in 1 year for monitoring.

## 2023-06-26 LAB — RNP ANTIBODIES: ENA RNP Ab: <0.2 AI (ref 0.0–0.9)

## 2023-06-27 ENCOUNTER — Encounter (INDEPENDENT_AMBULATORY_CARE_PROVIDER_SITE_OTHER): Payer: Self-pay | Admitting: Cardiology

## 2023-06-27 DIAGNOSIS — G4733 Obstructive sleep apnea (adult) (pediatric): Secondary | ICD-10-CM

## 2023-06-28 LAB — CYCLIC CITRUL PEPTIDE ANTIBODY, IGG: Cyclic Citrullin Peptide Ab: <16 U

## 2023-06-28 LAB — ANTI-SCLERODERMA ANTIBODY: Scleroderma (Scl-70) (ENA) Antibody, IgG: <1 AI

## 2023-06-28 LAB — ANTI-SMITH ANTIBODY: ENA SM Ab Ser-aCnc: <1 AI

## 2023-06-28 LAB — RHEUMATOID FACTOR: Rheumatoid fact SerPl-aCnc: <10 [IU]/mL (ref <14)

## 2023-06-28 LAB — SJOGREN'S SYNDROME ANTIBODS(SSA + SSB)
SSA (Ro) (ENA) Antibody, IgG: <1 AI
SSB (La) (ENA) Antibody, IgG: <1 AI

## 2023-06-28 LAB — ANA: Anti Nuclear Antibody (ANA): NEGATIVE

## 2023-06-28 LAB — ANCA SCREEN W REFLEX TITER: ANCA SCREEN: NEGATIVE

## 2023-06-28 LAB — CENTROMERE ANTIBODIES: Centromere Ab Screen: <1 AI

## 2023-06-28 LAB — HIV ANTIBODY (ROUTINE TESTING W REFLEX): HIV 1&2 Ab, 4th Generation: NONREACTIVE

## 2023-06-29 ENCOUNTER — Encounter: Payer: Self-pay | Admitting: Pulmonary Disease

## 2023-06-30 ENCOUNTER — Ambulatory Visit: Payer: 59 | Attending: Cardiovascular Disease

## 2023-06-30 DIAGNOSIS — R0683 Snoring: Secondary | ICD-10-CM

## 2023-06-30 DIAGNOSIS — R002 Palpitations: Secondary | ICD-10-CM

## 2023-06-30 DIAGNOSIS — R400 Somnolence: Secondary | ICD-10-CM

## 2023-06-30 NOTE — Procedures (Signed)
   SLEEP STUDY REPORT Patient Information Study Date: 06/27/2023 Patient Name: Carla Patrick Patient ID: [id number] Birth Date: [birth year] Age: 58 Gender: Female BMI: 42.2 (W=209 lb, H=4' 11'') Referring Physician: Chilton Si, MD  TEST DESCRIPTION: Home sleep apnea testing was completed using the WatchPat, a Type 1 device, utilizing  peripheral arterial tonometry (PAT), chest movement, actigraphy, pulse oximetry, pulse rate, body position and snore.  AHI was calculated with apnea and hypopnea using valid sleep time as the denominator. RDI includes apneas,  hypopneas, and RERAs. The data acquired and the scoring of sleep and all associated events were performed in  accordance with the recommended standards and specifications as outlined in the AASM Manual for the Scoring of  Sleep and Associated Events 2.2.0 (2015).   FINDINGS:   1. Mild Obstructive Sleep Apnea with AHI 5.3/hr.   2. No central Sleep Apnea with pAHIc 0.6/hr.   3. Oxygen desaturations as low as 92%.   4. Mild snoring was present. O2 sats were < 88% for min.   5. Total sleep time was 3 hrs and 10 min.   6. 13.3% of total sleep time was spent in REM sleep.   7. Normal sleep onset latency at 19 min.   8. Shortened REM sleep onset latency at 80 min.   9. Total awakenings were 5.  10. Arrhythmia detection: None. DIAGNOSIS: Minimal Obstructive Sleep Apnea (G47.33  RECOMMENDATIONS: 1. Clinical correlation of these findings is necessary. The decision to treat obstructive sleep apnea (OSA) is usually  based on the presence of apnea symptoms or the presence of associated medical conditions such as Hypertension,  Congestive Heart Failure, Atrial Fibrillation or Obesity. The most common symptoms of OSA are snoring, gasping for  breath while sleeping, daytime sleepiness and fatigue.   2. Initiating apnea therapy is recommended given the presence of symptoms and/or associated conditions.  Recommend proceeding with one  of the following:   a. Auto-CPAP therapy with a pressure range of 5-20cm H2O.   b. An oral appliance (OA) that can be obtained from certain dentists with expertise in sleep medicine. These are  primarily of use in non-obese patients with mild and moderate disease.   c. An ENT consultation which may be useful to look for specific causes of obstruction and possible treatment  options.   d. If patient is intolerant to PAP therapy, consider referral to ENT for evaluation for hypoglossal nerve stimulator.   3. Close follow-up is necessary to ensure success with CPAP or oral appliance therapy for maximum benefit .  4. A follow-up oximetry study on CPAP is recommended to assess the adequacy of therapy and determine the need  for supplemental oxygen or the potential need for Bi-level therapy. An arterial blood gas to determine the adequacy of  baseline ventilation and oxygenation should also be considered.  5. Healthy sleep recommendations include: adequate nightly sleep (normal 7-9 hrs/night), avoidance of caffeine after  noon and alcohol near bedtime, and maintaining a sleep environment that is cool, dark and quiet.  6. Weight loss for overweight patients is recommended. Even modest amounts of weight loss can significantly  improve the severity of sleep apnea.  7. Snoring recommendations include: weight loss where appropriate, side sleeping, and avoidance of alcohol before  bed.  8. Operation of motor vehicle should be avoided when sleepy.  Signature: Armanda Magic, MD; [facility]; Diplomat, American Board of Sleep  Medicine Electronically Signed: 06/30/2023 11:48:42 AM

## 2023-07-01 ENCOUNTER — Telehealth: Payer: Self-pay

## 2023-07-01 NOTE — Telephone Encounter (Signed)
Left VM with callback number to discuss Sleep study results and recommendations.

## 2023-07-01 NOTE — Telephone Encounter (Signed)
-----   Message from Armanda Magic sent at 06/30/2023 11:50 AM EST ----- Patient has minimal sleep apnea but had very poor sleep that night.  Please find out if how long patient normally sleeps at night so that we can determine if we need to repeat the sleep study

## 2023-07-13 NOTE — Progress Notes (Signed)
Cardiology Office Note:  .   Date:  07/14/2023  ID:  Carla Patrick, DOB 01-25-1966, MRN [id number] PCP: Merri Brunette, MD  [facility] Providers Cardiologist:  None    History of Present Illness: .   Carla Patrick is a 58 y.o. female with history of breast cancer hypertension hyperlipidemia, anxiety, cardiovascular sclerosis, HFrEF.   Established 2015 for abnormal EKG with left axis deviation, no further evaluation needed. Saw Dr. Wyline Mood 09/2021 after diagnosis of right breast cancer planning for tamoxifen therapy. Coronary calcium score 07/2020 of 0. She wore monitor 10/2022 at direction of her PCP due to palpitations with episodes of SVT, when results received Dr. Duke Salvia recommended initiation of Metoprolol which [name] just started recently. She was also recommended for sleep study 02/12/23, but has not yet completed.   Echo 04/13/23 ordered by PCP with LV normal size, moderate decrease in global wall motion, indeterminate diastolic function, LVEF 41%, LA mildly dilated, normal RVSF and size, mild AI, mild MR, mild to moderate TR, moderate PAH, trace PR. PCP initiated Lasix 20mg  and Potassium daily. Has been referred to pulmonology for persistent nonproductive cough and pulmonary hypertension on echo with consult scheduled 07/2023.   Last seen 06/02/23. Encouraged to complete [facility] home sleep study. Farxiga added for optimization of HFrEF GDMT. Cardiac CTA to rule out ischemia performed 06/10/23 with calcium score of 0. Radiology read with stable bilateral pulmonary nodules <1cm in size however given hx of breast cancer, recommended for CT wo contrast in 1 year for monitoring. She was taking Telmisartan inconsistently and recommended ot take regularly. Declined transition to [employer].   Sleep study with minimal sleep apnea but poor sleep that night. Called ot inquire how long she normally sleeps to determine if repeat study needed, voicemail left.   Presents today for  follow up. 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 She feels overall things are improving. She is having chest heaviness at rest and with activity. This is overall unchanged from prior. Discussed this is likely fluid or her lungs d/t  calcium score of 0 on cardiac CT. She is not currently consistently taking her [name] because she experienced dizziness and nausea. She also forgets to take her rosuvastatin. Pill box was discussed and she is open to trying it to become more consistent. She had a sleepy study with minimal sleep and she states that was her normal sleep routine that night. Has established with Dr. Francine Graven of pulmonology with PFT upcoming and considering RHC based on those results.  Reports no chest pain, pressure, or tightness. No edema, orthopnea, PND. Reports no palpitations.   ROS: Please see the history of present illness.    All other systems reviewed and are negative.   Studies Reviewed: .         Risk Assessment/Calculations:            Physical Exam:   VS:  BP 122/80 (BP Location: Left Arm, Patient Position: Sitting, Cuff Size: Large)   Pulse 78   Ht 4\' 11"  (1.499 m)   Wt 201 lb (91.2 kg)   SpO2 97%   BMI 40.60 kg/m    Wt Readings from Last 3 Encounters:  07/14/23 201 lb (91.2 kg)  06/24/23 204 lb 6.4 oz (92.7 kg)  06/02/23 210 lb 9.6 oz (95.5 kg)    GEN: Well nourished, overweight, well developed in no acute distress NECK: No JVD; No carotid bruits CARDIAC: RRR, no  murmurs, rubs, gallops RESPIRATORY:  Clear to auscultation without rales, wheezing or rhonchi  ABDOMEN: Soft, non-tender, non-distended EXTREMITIES:  No edema; No deformity   ASSESSMENT AND PLAN: .    HFrEF / PAH - Echo 05/13/23 LVEF 41% newly reduced. NYHA II with exertional dyspnea GDMT: Lasix 20 mg, potassium 40 mg daily, Telmisartan 40 mg, Toprol 50 mg, and Farxiga 10 mg. Farxiga refill provided.  Discussed the importance of [name] and the heart  benefits. Patient is willing to take consistently for a week and see how she feels. Anticipate may be able to reduce Lasix dose in the future. Discussed that chest heaviness is likely d/t fluid or lungs as the coronary CTA was clear of CAD. She was reassured by result. Euvolemic on exam.   Aortic atherosclerosis / HLD - Cardiac CTA showed no signs of CAD. She will take rosuvastatin 20 mg daily more consistently. Discussed pill box and she is willing to try to see if this is helpful.   SVT - No recent palpitations. Continue Toprol 50 mg daily.   HTN - BP today was 122/80. Well controlled on Telmisartan 40 mg, lasix 20 mg, and Toprol 50 mg. Discussed to monitor BP at home at least 2 hours after medications and sitting for 5-10 minutes.  Mild OSA - Only in bed 6 hours and long time to fall asleep. Patient states this is her normal sleep routine.  Will route to Dr. Mayford Knife for advice on next steps.   DM2 -not tolerate Ozempic. PCP started [facility]. Tolerating well.        Dispo: follow up with Dr. Duke Salvia in April.   Signed, Alver Sorrow, NP

## 2023-07-14 ENCOUNTER — Ambulatory Visit (HOSPITAL_BASED_OUTPATIENT_CLINIC_OR_DEPARTMENT_OTHER): Payer: 59 | Admitting: Family

## 2023-07-14 ENCOUNTER — Encounter (HOSPITAL_BASED_OUTPATIENT_CLINIC_OR_DEPARTMENT_OTHER): Payer: Self-pay | Admitting: Family

## 2023-07-14 VITALS — BP 122/80 | HR 78 | Ht 59.0 in | Wt 201.0 lb

## 2023-07-14 DIAGNOSIS — K766 Portal hypertension: Secondary | ICD-10-CM

## 2023-07-14 DIAGNOSIS — I2721 Secondary pulmonary arterial hypertension: Secondary | ICD-10-CM

## 2023-07-14 DIAGNOSIS — I5022 Chronic systolic (congestive) heart failure: Secondary | ICD-10-CM

## 2023-07-14 DIAGNOSIS — I700 Atherosclerosis of aorta: Secondary | ICD-10-CM

## 2023-07-14 DIAGNOSIS — I10 Essential (primary) hypertension: Secondary | ICD-10-CM

## 2023-07-14 DIAGNOSIS — I471 Supraventricular tachycardia, unspecified: Secondary | ICD-10-CM | POA: Diagnosis not present

## 2023-07-14 DIAGNOSIS — G4733 Obstructive sleep apnea (adult) (pediatric): Secondary | ICD-10-CM

## 2023-07-14 DIAGNOSIS — E782 Mixed hyperlipidemia: Secondary | ICD-10-CM

## 2023-07-14 MED ORDER — DAPAGLIFLOZIN PROPANEDIOL 10 MG PO TABS: 10.0000 mg | ORAL_TABLET | Freq: Every day | ORAL | 3 refills | Status: AC

## 2023-07-14 NOTE — Patient Instructions (Signed)
Medication Instructions:  Your physician recommends that you continue on your current medications as directed. Please refer to the Current Medication list given to you today.  Follow-Up: At [facility], you and your health needs are our priority.  As part of our continuing mission to provide you with exceptional heart care, we have created designated Provider Care Teams.  These Care Teams include your primary Cardiologist (physician) and Advanced Practice Providers (APPs -  Physician Assistants and Nurse Practitioners) who all work together to provide you with the care you need, when you need it.  We recommend signing up for the patient portal called "MyChart".  Sign up information is provided on this After Visit Summary.  MyChart is used to connect with patients for Virtual Visits (Telemedicine).  Patients are able to view lab/test results, encounter notes, upcoming appointments, etc.  Non-urgent messages can be sent to your provider as well.   To learn more about what you can do with MyChart, go to [email or web address].    Your next appointment:   Follow up as scheduled with Dr. Duke Salvia

## 2023-07-14 NOTE — Telephone Encounter (Signed)
Discussed in clinic 07/14/23. She sleeps when she wore the sleep study that was her usual nighttime routine. In the bed for 6 hours but notes it takes her a long time to fall asleep.   Will route to Dr. Mayford Knife for input. Has PFT upcoming 07/24/23. Anticipate with PAH and HFrEF may benefit from further testing.   Alver Sorrow, NP

## 2023-07-17 ENCOUNTER — Telehealth: Payer: Self-pay

## 2023-07-17 NOTE — Telephone Encounter (Signed)
Left VM with callback number for patient to receive sleep study results and recommendations.

## 2023-07-24 ENCOUNTER — Ambulatory Visit: Payer: 59 | Admitting: Pulmonary Disease

## 2023-07-24 DIAGNOSIS — I272 Pulmonary hypertension, unspecified: Secondary | ICD-10-CM

## 2023-07-24 LAB — PULMONARY FUNCTION TEST
DL/VA % pred: 123 %
DL/VA: 5.44 ml/min/mmHg/L
DLCO cor % pred: 92 %
DLCO cor: 16.01 ml/min/mmHg
DLCO unc % pred: 92 %
DLCO unc: 16.01 ml/min/mmHg
FEF 25-75 Post: 1.13 L/s
FEF 25-75 Pre: 1.19 L/s
FEF2575-%Change-Post: -4 %
FEF2575-%Pred-Post: 50 %
FEF2575-%Pred-Pre: 53 %
FEV1-%Change-Post: -1 %
FEV1-%Pred-Post: 63 %
FEV1-%Pred-Pre: 64 %
FEV1-Post: 1.4 L
FEV1-Pre: 1.43 L
FEV1FVC-%Change-Post: 1 %
FEV1FVC-%Pred-Pre: 99 %
FEV6-%Change-Post: -2 %
FEV6-%Pred-Post: 64 %
FEV6-%Pred-Pre: 66 %
FEV6-Post: 1.77 L
FEV6-Pre: 1.82 L
FEV6FVC-%Change-Post: 0 %
FEV6FVC-%Pred-Post: 103 %
FEV6FVC-%Pred-Pre: 103 %
FVC-%Change-Post: -3 %
FVC-%Pred-Post: 62 %
FVC-%Pred-Pre: 64 %
FVC-Post: 1.77 L
FVC-Pre: 1.82 L
Post FEV1/FVC ratio: 79 %
Post FEV6/FVC ratio: 100 %
Pre FEV1/FVC ratio: 78 %
Pre FEV6/FVC Ratio: 100 %
RV % pred: 78 %
RV: 1.29 L
TLC % pred: 76 %
TLC: 3.28 L

## 2023-07-24 NOTE — Progress Notes (Signed)
 Full PFT performed today.

## 2023-07-24 NOTE — Patient Instructions (Signed)
 Full PFT performed today.

## 2023-07-27 ENCOUNTER — Institutional Professional Consult (permissible substitution): Payer: BC Managed Care – PPO | Admitting: Pulmonary Disease

## 2023-08-17 ENCOUNTER — Other Ambulatory Visit: Payer: Self-pay

## 2023-08-17 DIAGNOSIS — C50411 Malignant neoplasm of upper-outer quadrant of right female breast: Secondary | ICD-10-CM

## 2023-08-18 ENCOUNTER — Inpatient Hospital Stay: Payer: BC Managed Care – PPO | Attending: Nurse Practitioner

## 2023-08-18 ENCOUNTER — Inpatient Hospital Stay: Payer: BC Managed Care – PPO | Admitting: Nurse Practitioner

## 2023-08-18 ENCOUNTER — Other Ambulatory Visit: Payer: Self-pay | Admitting: Nurse Practitioner

## 2023-08-18 ENCOUNTER — Telehealth: Payer: Self-pay

## 2023-08-18 DIAGNOSIS — Z170 Estrogen receptor positive status [ER+]: Secondary | ICD-10-CM

## 2023-08-18 NOTE — Progress Notes (Deleted)
 Patient Care Team: Merri Brunette, MD as PCP - General (Internal Medicine) Pershing Proud, RN as Oncology Nurse Navigator Donnelly Angelica, RN as Oncology Nurse Navigator Griselda Miner, MD as Consulting Physician (General Surgery) Malachy Mood, MD as Consulting Physician (Hematology) Antony Blackbird, MD as Consulting Physician (Radiation Oncology) Pollyann Samples, NP as Nurse Practitioner (Nurse Practitioner)   CHIEF COMPLAINT: Follow up right breast cancer   Oncology History Overview Note   Cancer Staging  Malignant neoplasm of upper-outer quadrant of right breast in female, estrogen receptor positive (HCC) Staging form: Breast, AJCC 8th Edition - Clinical stage from 12/26/2020: Stage IA (cT1b, cN0, cM0, G2, ER+, PR-, HER2-) - Unsigned Stage prefix: Initial diagnosis Histologic grading system: 3 grade system Staged by: Pathologist and managing physician Stage used in treatment planning: Yes National guidelines used in treatment planning: Yes Type of national guideline used in treatment planning: NCCN - Pathologic stage from 01/12/2021: Stage IA (pT1c, pN0(i+), cM0, G2, ER+, PR-, HER2-, Oncotype DX score: 22) - Signed by Malachy Mood, MD on 04/27/2021 Stage prefix: Initial diagnosis Multigene prognostic tests performed: Oncotype DX Recurrence score range: Greater than or equal to 11 Histologic grading system: 3 grade system Residual tumor (R): R0 - None    Malignant neoplasm of upper-outer quadrant of right breast in female, estrogen receptor positive (HCC)  12/05/2020 Mammogram   Right Breast Diagnostic Mammogram; Right Breast Ultrasound  IMPRESSION: The 0.4 x 0.5 x 0.7 cm taller-than-wide irregular mass in the right breast is suspicious of malignancy. No significant abnormalities were seen sonographically in the right axilla.   12/17/2020 Pathology Results   Diagnosis:  Breast, right, needle core biopsy, 12 O'CLOCK middle depth, 9 cmfn - INVASIVE MAMMARY CARCINOMA - MAMMARY  CARCINOMA IN SITU  The carcinoma appears Nottingham grade 2 of 3. E-cadherin is negative in the invasive carcinoma, supporting lobular origin.  PROGNOSTIC INDICATORS Results: IMMUNOHISTOCHEMICAL AND MORPHOMETRIC ANALYSIS PERFORMED MANUALLY The tumor cells are EQUIVOCAL for Her2 (2+). Her2 by FISH will be performed and results reported separately. Estrogen Receptor: 90%, POSITIVE, MODERATE STAINING INTENSITY Progesterone Receptor: 0%, NEGATIVE Proliferation Marker Ki67: 1%  FLUORESCENCE IN-SITU HYBRIDIZATION Results: GROUP 5: HER 2 **NEGATIVE** Equivocal form of amplification of the HER2 gene was detected in the IHC 2+ tissue sample received from this individual. HER2 FISH was performed by a technologist and cell imaging and analysis on the [employer]. RATIO OF ER2/CEN17 SIGNALS 1.15 AVERAGE HER2 COPY NUMBER PER CELL 1.95   12/24/2020 Initial Diagnosis   Malignant neoplasm of upper-outer quadrant of right breast in female, estrogen receptor positive (HCC)   01/12/2021 Cancer Staging   Staging form: Breast, AJCC 8th Edition - Pathologic stage from 01/12/2021: Stage IA (pT1c, pN0(i+), cM0, G2, ER+, PR-, HER2-, Oncotype DX score: 22) - Signed by Malachy Mood, MD on 04/27/2021 Stage prefix: Initial diagnosis Multigene prognostic tests performed: Oncotype DX Recurrence score range: Greater than or equal to 11 Histologic grading system: 3 grade system Residual tumor (R): R0 - None   01/30/2021 Definitive Surgery   FINAL MICROSCOPIC DIAGNOSIS:   A. LYMPH NODE, RIGHT AXILLARY #1, SENTINEL, EXCISION:  -  Isolated tumor cells identified in one lymph node (0i/1)  -  See comment   B. LYMPH NODE, RIGHT AXILLARY, SENTINEL, EXCISION:  -  No carcinoma identified in one lymph node (0/1)  -  See comment   C. LYMPH NODE, RIGHT AXILLARY #2, SENTINEL, EXCISION:  -  No carcinoma identified in one lymph node (0/1)  -  See  comment   D. LYMPH NODE, RIGHT AXILLARY, SENTINEL, EXCISION:  -  No carcinoma  identified in one lymph node (0/1)  -  See comment   E. LYMPH NODE, RIGHT AXILLARY #3, SENTINEL, EXCISION:  -  No carcinoma identified in one lymph node (0/1)  -  See comment   F. LYMPH NODE, RIGHT AXILLARY #4, SENTINEL, EXCISION:  -  No carcinoma identified in one lymph node (0/1)  -  See comment   G. LYMPH NODE, RIGHT AXILLARY, SENTINEL, EXCISION:  -  No carcinoma identified in one lymph node (0/1)  -  See comment   H. LYMPH NODE, RIGHT AXILLARY #5, SENTINEL, EXCISION:  -  Benign fibroadipose tissue  -  No lymphoid tissue identified   I. BREAST, RIGHT, LUMPECTOMY:  -  Invasive lobular carcinoma, Nottingham grade 2 of 3, 1.5 cm  -  Lobular neoplasia (atypical lobular hyperplasia)  -  Margins uninvolved by carcinoma (0.7 cm; inferior margin)  -  Previous biopsy site changes present  -  See oncology table and comment below    01/30/2021 Miscellaneous   Oncotype Recurrence Score of 22, risk of distant metastasis is 8% with antiestrogen therapy alone.   03/18/2021 - 04/15/2021 Radiation Therapy   Site Technique Total Dose (Gy) Dose per Fx (Gy) Completed Fx Beam Energies  Breast, Right: Breast_Rt 3D 40.05/40.05 2.67 15/15 10X  Breast, Right: Breast_Rt_Bst 3D 10/10 2 5/5 6X, 10X     05/2021 - 08/2022 Anti-estrogen oral therapy   Took low dose Tamoxifen intermittently, stopped 08/2022. Did not tolerate full dose due to SE's. After she stopped, pt developed abnormal vaginal bleeding.    12/16/2022 Survivorship   SCP delivered by Santiago Glad, NP   05/20/2023 Imaging   Bilateral breast MRI with and without contrast IMPRESSION: 1. No evidence of new or recurrent breast carcinoma. 2. Benign post lumpectomy changes on the right  RECOMMENDATION: 1. Annual screening mammography, last bilateral exam performed on 08/15/2022. 2. Consider supplemental high risk screening breast MRI given patient's personal history breast malignancy.  BI-RADS CATEGORY  2: Benign.      CURRENT THERAPY:  Previously on Tamoxifen, stopped due to vaginal bleeding, started Exemestane 02/2023  INTERVAL HISTORY Carla Patrick presents for follow up, last seen by me 02/2023 and started Exemestane, then phone f/up with Vincent Gros, NP 06/2023.   ROS   Past Medical History:  Diagnosis Date   Abnormal EKG    Acute sinusitis    Allergic rhinitis    Anemia    Asthma    Cancer (HCC) 11/2020   right breast [facility]   Depression    Elevated blood pressure reading    Essential hypertension 02/12/2023   Heart murmur    mild no cardiologist   History of carpal tunnel release    History of radiation therapy    Right Breast- 03/18/21-04/15/21-Dr. Antony Blackbird   Hyperlipidemia    Hypertension    Morbid obesity (HCC) 02/12/2023   Morbid obesity due to excess calories (HCC)    Obese    Osteoarthritis    Palpitations 02/12/2023   PMB (postmenopausal bleeding)    Pre-diabetes    Pure hypercholesterolemia 02/12/2023   Sciatica of left side    Sinus infection    Thrombocytosis    Wears glasses      Past Surgical History:  Procedure Laterality Date   BREAST EXCISIONAL BIOPSY  2007   left   BREAST LUMPECTOMY WITH RADIOACTIVE SEED AND SENTINEL LYMPH NODE BIOPSY Right 01/30/2021  Procedure: RIGHT BREAST LUMPECTOMY WITH RADIOACTIVE SEED AND SENTINEL LYMPH NODE BIOPSY;  Surgeon: Griselda Miner, MD;  Location: [facility];  Service: General;  Laterality: Right;   CHOLECYSTECTOMY  10/04/2011   Procedure: LAPAROSCOPIC CHOLECYSTECTOMY WITH INTRAOPERATIVE CHOLANGIOGRAM;  Surgeon: Emelia Loron, MD;  Location: [facility];  Service: General;  Laterality: N/A;   DILATATION & CURETTAGE/HYSTEROSCOPY WITH MYOSURE N/A 01/09/2023   Procedure: DILATATION & CURETTAGE/HYSTEROSCOPY WITH MYOSURE;  Surgeon: Maxie Better, MD;  Location: [facility];  Service: Gynecology;  Laterality: N/A;     Outpatient Encounter Medications as of 08/18/2023  Medication Sig Note   dapagliflozin propanediol  (FARXIGA) 10 MG TABS tablet Take 1 tablet (10 mg total) by mouth daily before breakfast.    exemestane (AROMASIN) 25 MG tablet Take 1 tablet (25 mg total) by mouth daily after breakfast.    furosemide (LASIX) 20 MG tablet Take 20 mg by mouth daily.    ibuprofen (ADVIL) 800 MG tablet Take 1 tablet (800 mg total) by mouth every 8 (eight) hours as needed.    metoprolol succinate (TOPROL-XL) 50 MG 24 hr tablet Take 1 tablet (50 mg total) by mouth daily. Take with or immediately following a meal.    potassium chloride SA (KLOR-CON M) 20 MEQ tablet Take 20 mEq by mouth 2 (two) times daily.    PROAIR HFA 108 (90 BASE) MCG/ACT inhaler  11/08/2013: Received from: External Pharmacy   rosuvastatin (CRESTOR) 20 MG tablet Take 1 tablet (20 mg total) by mouth daily.    SYMBICORT 160-4.5 MCG/ACT inhaler     telmisartan (MICARDIS) 40 MG tablet Take 1 tablet (40 mg total) by mouth daily.    tirzepatide [facility]) 2.5 MG/0.5ML Pen Inject 2.5 mg into the skin once a week.    No facility-administered encounter medications on file as of 08/18/2023.     There were no vitals filed for this visit. There is no height or weight on file to calculate BMI.   PHYSICAL EXAM GENERAL:alert, no distress and comfortable SKIN: no rash  EYES: sclera clear NECK: without mass LYMPH:  no palpable cervical or supraclavicular lymphadenopathy  LUNGS: clear with normal breathing effort HEART: regular rate & rhythm, no lower extremity edema ABDOMEN: abdomen soft, non-tender and normal bowel sounds NEURO: alert & oriented x 3 with fluent speech, no focal motor/sensory deficits Breast exam:  PAC without erythema    CBC    Component Value Date/Time   WBC 7.8 01/09/2023 0902   RBC 2.99 (L) 01/09/2023 0902   HGB 10.2 (L) 01/09/2023 0909   HGB 11.9 (L) 12/16/2022 0941   HGB 12.1 09/20/2021 1122   HCT 30.0 (L) 01/09/2023 0909   HCT 36.5 09/20/2021 1122   PLT 420 (H) 01/09/2023 0902   PLT 341 12/16/2022 0941   PLT 333 09/20/2021  1122   MCV 99.3 01/09/2023 0902   MCV 96 09/20/2021 1122   MCH 31.4 01/09/2023 0902   MCHC 31.6 01/09/2023 0902   RDW 13.3 01/09/2023 0902   RDW 12.5 09/20/2021 1122   LYMPHSABS 2.3 12/16/2022 0941   LYMPHSABS 1.7 09/20/2021 1122   MONOABS 0.7 12/16/2022 0941   EOSABS 0.2 12/16/2022 0941   EOSABS 0.1 09/20/2021 1122   BASOSABS 0.0 12/16/2022 0941   BASOSABS 0.0 09/20/2021 1122     CMP     Component Value Date/Time   NA 139 01/09/2023 0909   NA 144 09/20/2021 1122   K 3.8 01/09/2023 0909   CL 104 01/09/2023 0909  CO2 30 12/16/2022 0941   GLUCOSE 137 (H) 01/09/2023 0909   BUN 14 01/09/2023 0909   BUN 10 09/20/2021 1122   CREATININE 0.70 01/09/2023 0909   CREATININE 0.74 12/16/2022 0941   CALCIUM 9.4 12/16/2022 0941   PROT 6.8 12/16/2022 0941   PROT 6.7 09/20/2021 1122   ALBUMIN 4.1 12/16/2022 0941   ALBUMIN 4.4 09/20/2021 1122   AST 10 (L) 12/16/2022 0941   ALT 19 12/16/2022 0941   ALKPHOS 78 12/16/2022 0941   BILITOT 0.8 12/16/2022 0941   GFRNONAA >60 12/16/2022 0941   GFRAA >90 10/05/2011 0443     ASSESSMENT & PLAN:  PLAN:  No orders of the defined types were placed in this encounter.     All questions were answered. The patient knows to call the clinic with any problems, questions or concerns. No barriers to learning were detected. I spent *** counseling the patient face to face. The total time spent in the appointment was *** and more than 50% was on counseling, review of test results, and coordination of care.   Santiago Glad, NP-C @DATE @

## 2023-08-18 NOTE — Telephone Encounter (Signed)
 Called patient due to not showing up for her 1030 lab app or her 11 app with Santiago Glad NP. Called LM on VM. Will no show patient for all app today.

## 2023-09-08 ENCOUNTER — Encounter: Payer: Self-pay | Admitting: Hematology

## 2023-09-23 ENCOUNTER — Encounter: Payer: Self-pay | Admitting: Pulmonary Disease

## 2023-09-23 ENCOUNTER — Ambulatory Visit: Payer: 59 | Admitting: Pulmonary Disease

## 2023-09-23 VITALS — BP 137/92 | HR 76 | Ht 59.0 in | Wt 206.0 lb

## 2023-09-23 DIAGNOSIS — I5022 Chronic systolic (congestive) heart failure: Secondary | ICD-10-CM

## 2023-09-23 DIAGNOSIS — J984 Other disorders of lung: Secondary | ICD-10-CM

## 2023-09-23 DIAGNOSIS — J4520 Mild intermittent asthma, uncomplicated: Secondary | ICD-10-CM

## 2023-09-23 DIAGNOSIS — J4540 Moderate persistent asthma, uncomplicated: Secondary | ICD-10-CM

## 2023-09-23 DIAGNOSIS — G4730 Sleep apnea, unspecified: Secondary | ICD-10-CM | POA: Diagnosis not present

## 2023-09-23 DIAGNOSIS — Z6841 Body Mass Index (BMI) 40.0 and over, adult: Secondary | ICD-10-CM

## 2023-09-23 MED ORDER — ALBUTEROL SULFATE HFA 108 (90 BASE) MCG/ACT IN AERS: 1.0000 | INHALATION_SPRAY | Freq: Four times a day (QID) | RESPIRATORY_TRACT | 11 refills | Status: AC | PRN

## 2023-09-23 MED ORDER — SYMBICORT 160-4.5 MCG/ACT IN AERO: 2.0000 | INHALATION_SPRAY | Freq: Two times a day (BID) | RESPIRATORY_TRACT | 11 refills | Status: AC

## 2023-09-23 NOTE — Progress Notes (Signed)
 Synopsis: Referred in January 2025 for pulmonary hypertension  Subjective:   PATIENT ID: Carla Patrick GENDER: female DOB: 1966/01/01, MRN: [id number]  HPI  Chief Complaint  Patient presents with   Follow-up   Carla Patrick is a 58 year old woman, never smoker with history of hypertension, obesity, breast cancer and chronic systolic heart failure who is referred to pulmonary clinic for evaluation of pulmonary hypertension.   Initial OV 06/24/2023 The patient, a long-standing asthmatic, presented with a chief complaint of persistent shortness of breath. The onset of symptoms was associated with a severe respiratory tract infection in November, which was treated with two courses of prednisone  and an antibiotic. Despite treatment, the patient reported persistent breathing difficulties. An X-ray revealed possible fluid and an enlarged heart, prompting further investigation. An ultrasound of the heart indicated that one valve was not pumping effectively, with an estimated function of 40%. A subsequent CT scan to examine the arteries was reportedly unremarkable.  The patient reported feeling better but not back to their normal self since starting several medications to improve heart function. They described an inability to take a deep breath and a sensation of something rattling in their chest. The patient also reported a history of irregular heartbeats and arrhythmia, which had been monitored previously.  The patient's asthma, diagnosed in childhood, was managed as needed with inhalers. However, since the infection, the patient reported using their Symbicort  inhaler almost daily, a significant increase from their usual usage. The patient also reported a feeling of pressure in their chest, rapid heartbeats, and occasional leg swelling.  The patient denied any history of smoking or significant dust or chemical exposures. They reported a family history of asthma, with their mother having chronic  asthma. The patient had a dog for about ten years, which recently passed away.  The patient also reported arthritis, mainly in the knees, with occasional hand stiffness, particularly in the morning. They denied any significant heartburn, reflux, or sinus congestion, but reported snoring during sleep. The patient also reported some weight gain over the years, which they were attempting to manage with medication and increased physical activity.  OV 09/23/23  She feels better than during her last visit, although not at her optimal health. She can walk without experiencing severe chest discomfort. Her improvement is attributed to her medications, noting that missing doses results in shortness of breath. Most heart medications are taken at night due to fatigue, but doses are sometimes missed when her schedule is hectic. She missed her medication last night after a late return home and falling asleep on the couch.  She is out of her albuterol  inhaler, which is used as needed, typically once or twice a week, especially when exposed to triggers like paint fumes, pollen, or physical exertion such as climbing stairs. She has been managing without it but would use it if available.  Her recent breathing test showed mild restriction, but normal diffusion capacity. A sleep study indicated very mild sleep apnea, and she typically sleeps only 2-3 hours a night, often due to her busy schedule. She acknowledges needing more sleep as she ages.  She is on Mounjaro for weight management but sometimes misses doses. Her weight has been stable since January, fluctuating slightly. She is considering dietary changes and increasing physical activity, noting that her husband has encouraged her to use a treadmill and has prepared meals to help her avoid eating out.  She has a family history of heart disease, with many relatives dying young  from the condition.  Past Medical History:  Diagnosis Date   Abnormal EKG    Acute  sinusitis    Allergic rhinitis    Anemia    Asthma    Cancer (HCC) 11/2020   right breast [facility]   Depression    Elevated blood pressure reading    Essential hypertension 02/12/2023   Heart murmur    mild no cardiologist   History of carpal tunnel release    History of radiation therapy    Right Breast- 03/18/21-04/15/21-Dr. Retta Caster   Hyperlipidemia    Hypertension    Morbid obesity (HCC) 02/12/2023   Morbid obesity due to excess calories (HCC)    Obese    Osteoarthritis    Palpitations 02/12/2023   PMB (postmenopausal bleeding)    Pre-diabetes    Pure hypercholesterolemia 02/12/2023   Sciatica of left side    Sinus infection    Thrombocytosis    Wears glasses      Family History  Problem Relation Age of Onset   Hypertension Mother    Asthma Mother    Diabetes Mother    HIV Father    Heart attack Maternal Grandmother 40   Heart disease Maternal Grandmother    Diabetes Maternal Grandfather    Diabetes Paternal Grandmother      Social History   Socioeconomic History   Marital status: Married    Spouse name: Not on file   Number of children: 0   Years of education: Not on file   Highest education level: Not on file  Occupational History   Not on file  Tobacco Use   Smoking status: Never   Smokeless tobacco: Never  Vaping Use   Vaping status: Never Used  Substance and Sexual Activity   Alcohol use: Yes    Comment: rare glass of wine   Drug use: No   Sexual activity: Yes    Birth control/protection: None  Other Topics Concern   Not on file  Social History Narrative   Not on file   Social Drivers of Health   Financial Resource Strain: Not on file  Food Insecurity: No Food Insecurity (02/12/2023)   Hunger Vital Sign    Worried About Running Out of Food in the Last Year: Never true    Ran Out of Food in the Last Year: Never true  Transportation Needs: No Transportation Needs (02/12/2023)   PRAPARE - Administrator, Civil Service  (Medical): No    Lack of Transportation (Non-Medical): No  Physical Activity: Inactive (02/12/2023)   Exercise Vital Sign    Days of Exercise per Week: 0 days    Minutes of Exercise per Session: 0 min  Stress: Not on file  Social Connections: Unknown (10/15/2021)   Received from [facility], [facility]   Social Network    Social Network: Not on file  Intimate Partner Violence: Unknown (09/06/2021)   Received from [facility], [facility]   HITS    Physically Hurt: Not on file    Insult or Talk Down To: Not on file    Threaten Physical Harm: Not on file    Scream or Curse: Not on file     No Known Allergies   Outpatient Medications Prior to Visit  Medication Sig Dispense Refill   dapagliflozin  propanediol (FARXIGA ) 10 MG TABS tablet Take 1 tablet (10 mg total) by mouth daily before breakfast. 90 tablet 3   exemestane  (AROMASIN ) 25 MG tablet Take 1 tablet (25 mg  total) by mouth daily after breakfast. 30 tablet 6   furosemide (LASIX) 20 MG tablet Take 20 mg by mouth daily.     ibuprofen  (ADVIL ) 800 MG tablet Take 1 tablet (800 mg total) by mouth every 8 (eight) hours as needed. 30 tablet 11   potassium chloride SA (KLOR-CON M) 20 MEQ tablet Take 20 mEq by mouth 2 (two) times daily.     PROAIR  HFA 108 (90 BASE) MCG/ACT inhaler      rosuvastatin  (CRESTOR ) 20 MG tablet Take 1 tablet (20 mg total) by mouth daily. 90 tablet 3   SYMBICORT  160-4.5 MCG/ACT inhaler      telmisartan  (MICARDIS ) 40 MG tablet Take 1 tablet (40 mg total) by mouth daily. 90 tablet 1   tirzepatide (MOUNJARO) 2.5 MG/0.5ML Pen Inject 2.5 mg into the skin once a week.     metoprolol  succinate (TOPROL -XL) 50 MG 24 hr tablet Take 1 tablet (50 mg total) by mouth daily. Take with or immediately following a meal. 90 tablet 3   No facility-administered medications prior to visit.    Review of Systems  Constitutional:  Negative for chills, fever, malaise/fatigue and weight loss.  HENT:  Negative for congestion,  sinus pain and sore throat.   Eyes: Negative.   Respiratory:  Positive for shortness of breath. Negative for cough, hemoptysis, sputum production and wheezing.   Cardiovascular:  Negative for chest pain, palpitations, orthopnea, claudication and leg swelling.  Gastrointestinal:  Negative for abdominal pain, heartburn, nausea and vomiting.  Genitourinary: Negative.   Musculoskeletal:  Negative for joint pain and myalgias.  Skin:  Negative for rash.  Neurological:  Negative for weakness.  Endo/Heme/Allergies: Negative.   Psychiatric/Behavioral: Negative.      Objective:   Vitals:   09/23/23 0844  BP: (!) 137/92  Pulse: 76  SpO2: 98%  Weight: 206 lb (93.4 kg)  Height: 4\' 11"  (1.499 m)    Physical Exam Constitutional:      General: She is not in acute distress.    Appearance: Normal appearance. She is obese.  Eyes:     General: No scleral icterus.    Conjunctiva/sclera: Conjunctivae normal.  Cardiovascular:     Rate and Rhythm: Normal rate and regular rhythm.  Pulmonary:     Breath sounds: No wheezing, rhonchi or rales.  Musculoskeletal:     Right lower leg: No edema.     Left lower leg: No edema.  Skin:    General: Skin is warm and dry.  Neurological:     General: No focal deficit present.    CBC    Component Value Date/Time   WBC 7.8 01/09/2023 0902   RBC 2.99 (L) 01/09/2023 0902   HGB 10.2 (L) 01/09/2023 0909   HGB 11.9 (L) 12/16/2022 0941   HGB 12.1 09/20/2021 1122   HCT 30.0 (L) 01/09/2023 0909   HCT 36.5 09/20/2021 1122   PLT 420 (H) 01/09/2023 0902   PLT 341 12/16/2022 0941   PLT 333 09/20/2021 1122   MCV 99.3 01/09/2023 0902   MCV 96 09/20/2021 1122   MCH 31.4 01/09/2023 0902   MCHC 31.6 01/09/2023 0902   RDW 13.3 01/09/2023 0902   RDW 12.5 09/20/2021 1122   LYMPHSABS 2.3 12/16/2022 0941   LYMPHSABS 1.7 09/20/2021 1122   MONOABS 0.7 12/16/2022 0941   EOSABS 0.2 12/16/2022 0941   EOSABS 0.1 09/20/2021 1122   BASOSABS 0.0 12/16/2022 0941    BASOSABS 0.0 09/20/2021 1122      Latest Ref Rng &  Units 01/09/2023    9:09 AM 12/16/2022    9:41 AM 11/20/2021   11:48 AM  BMP  Glucose 70 - 99 mg/dL 119  147  91   BUN 6 - 20 mg/dL 14  8  12    Creatinine 0.44 - 1.00 mg/dL 8.29  5.62  1.30   Sodium 135 - 145 mmol/L 139  141  141   Potassium 3.5 - 5.1 mmol/L 3.8  3.9  3.9   Chloride 98 - 111 mmol/L 104  105  106   CO2 22 - 32 mmol/L  30  30   Calcium  8.9 - 10.3 mg/dL  9.4  9.7    Chest imaging: CT Chest 08/2021 Cardiovascular: Limited evaluation in the absence of intravenous contrast. Two vessel aortic arch. The right brachiocephalic and left common carotid artery share a common origin. Trace atherosclerotic calcifications along the ascending thoracic aorta. No aneurysm. Main pulmonary artery is normal in size. The heart is normal in size. No pericardial effusion.   Mediastinum/Nodes: Unremarkable CT appearance of the thyroid gland. No suspicious mediastinal or hilar adenopathy. No soft tissue mediastinal mass. The thoracic esophagus is unremarkable.   Lungs/Pleura: 4 mm right middle lobe pulmonary nodule (image 78 series 8). 3 mm left lower lobe pulmonary nodule (image 100 series 8). Based upon reported findings on the prior examination, these nodules demonstrate no significant interval change. The lungs are otherwise clear.  PFT:    Latest Ref Rng & Units 06/24/2023   11:17 AM  PFT Results  FVC-Pre L 1.82   FVC-Predicted Pre % 64   FVC-Post L 1.77   FVC-Predicted Post % 62   Pre FEV1/FVC % % 78   Post FEV1/FCV % % 79   FEV1-Pre L 1.43   FEV1-Predicted Pre % 64   FEV1-Post L 1.40   DLCO uncorrected ml/min/mmHg 16.01   DLCO UNC% % 92   DLCO corrected ml/min/mmHg 16.01   DLCO COR %Predicted % 92   DLVA Predicted % 123   TLC L 3.28   TLC % Predicted % 76   RV % Predicted % 78     Labs:  Path:  Echo 05/13/23: LV EF 41%. Indeterminate diastolic filling pattern. Normal RV size and Function. RVSP . LA is  mildly dilated. Mild aortic valve regurgitation. Mild mitral valve regurgitation. Mild mitral valve  Heart Catheterization:     Home Sleep Study 06/27/23 FINDINGS:   1. Mild Obstructive Sleep Apnea with AHI 5.3/hr.   2. No central Sleep Apnea with pAHIc 0.6/hr.   3. Oxygen desaturations as low as 92%.   4. Mild snoring was present. O2 sats were < 88% for min.   5. Total sleep time was 3 hrs and 10 min.   6. 13.3% of total sleep time was spent in REM sleep.   7. Normal sleep onset latency at 19 min.   8. Shortened REM sleep onset latency at 80 min.   9. Total awakenings were 5.  10. Arrhythmia detection: None. DIAGNOSIS: Minimal Obstructive Sleep Apnea  Assessment & Plan:   No diagnosis found.  Discussion: Carla Patrick is a 58 year old woman, never smoker with history of hypertension, obesity, breast cancer and chronic systolic heart failure who returns to pulmonary clinic for asthma  Chronic Systolic Heart Failure Suspected Pulmonary hypertension Mild pulmonary hypertension with pressure of 41 mmHg. Inflammatory workup and HIV testing negative. Normal diffusion capacity.  - Continue current medication regimen for heart failure - Monitor symptoms and consider right  heart catheterization if symptoms worsen or echocardiogram changes.  Asthma Intermittent asthma symptoms exacerbated by environmental factors and exertion. Out of albuterol  inhaler. - Continue symbicort  2 puffs twice daily - continue albuterol  inhaler as needed  Mild restrictive lung disease Mild restriction on pulmonary function tests. Normal lung diffusion capacity. Weight possible contributing factors. - Encourage weight loss to improve lung capacity. - Repeat pulmonary function test after weight loss.  Mild sleep apnea Mild sleep apnea with limited data. CPAP therapy considered but not typically warranted. Discussed alternatives like dental devices and weight loss. - Focus on weight loss. - Consider CPAP  therapy trial if symptoms persist. - Explore dental devices as alternative.  Obesity Discussed dietary changes and increased activity for weight loss. - Encourage 10-pound weight loss. - Consider medical weight loss program through [facility]. - Increase physical activity, use treadmill at home. - Make dietary changes, reduce fast food, explore healthier takeout options.  General Health Maintenance -Encourage weight loss   Follow-up in 6 months.  Duaine German, MD [facility] Office: [phone]   Current Outpatient Medications:    dapagliflozin  propanediol (FARXIGA ) 10 MG TABS tablet, Take 1 tablet (10 mg total) by mouth daily before breakfast., Disp: 90 tablet, Rfl: 3   exemestane  (AROMASIN ) 25 MG tablet, Take 1 tablet (25 mg total) by mouth daily after breakfast., Disp: 30 tablet, Rfl: 6   furosemide (LASIX) 20 MG tablet, Take 20 mg by mouth daily., Disp: , Rfl:    ibuprofen  (ADVIL ) 800 MG tablet, Take 1 tablet (800 mg total) by mouth every 8 (eight) hours as needed., Disp: 30 tablet, Rfl: 11   potassium chloride SA (KLOR-CON M) 20 MEQ tablet, Take 20 mEq by mouth 2 (two) times daily., Disp: , Rfl:    PROAIR  HFA 108 (90 BASE) MCG/ACT inhaler, , Disp: , Rfl:    rosuvastatin  (CRESTOR ) 20 MG tablet, Take 1 tablet (20 mg total) by mouth daily., Disp: 90 tablet, Rfl: 3   SYMBICORT  160-4.5 MCG/ACT inhaler, , Disp: , Rfl:    telmisartan  (MICARDIS ) 40 MG tablet, Take 1 tablet (40 mg total) by mouth daily., Disp: 90 tablet, Rfl: 1   tirzepatide (MOUNJARO) 2.5 MG/0.5ML Pen, Inject 2.5 mg into the skin once a week., Disp: , Rfl:    metoprolol  succinate (TOPROL -XL) 50 MG 24 hr tablet, Take 1 tablet (50 mg total) by mouth daily. Take with or immediately following a meal., Disp: 90 tablet, Rfl: 3

## 2023-09-23 NOTE — Patient Instructions (Addendum)
 Your breathing tests show mild restriction  Continue albuterol  inhaler as needed  Continue symbicort  inhaler 2 puffs twice daily - rinse mouth out after each use  Follow up in 6 months via video visit

## 2023-09-30 ENCOUNTER — Ambulatory Visit (HOSPITAL_BASED_OUTPATIENT_CLINIC_OR_DEPARTMENT_OTHER): Payer: Self-pay | Admitting: Cardiovascular Disease

## 2023-09-30 ENCOUNTER — Encounter (HOSPITAL_BASED_OUTPATIENT_CLINIC_OR_DEPARTMENT_OTHER): Payer: Self-pay | Admitting: Cardiovascular Disease

## 2023-09-30 VITALS — BP 120/86 | HR 86 | Ht 59.5 in | Wt 203.4 lb

## 2023-09-30 DIAGNOSIS — R002 Palpitations: Secondary | ICD-10-CM

## 2023-09-30 DIAGNOSIS — I509 Heart failure, unspecified: Secondary | ICD-10-CM

## 2023-09-30 DIAGNOSIS — I10 Essential (primary) hypertension: Secondary | ICD-10-CM

## 2023-09-30 DIAGNOSIS — E7800 Pure hypercholesterolemia, unspecified: Secondary | ICD-10-CM

## 2023-09-30 NOTE — Patient Instructions (Signed)
 Medication Instructions:  Your physician recommends that you continue on your current medications as directed. Please refer to the Current Medication list given to you today.   *If you need a refill on your cardiac medications before your next appointment, please call your pharmacy*  Lab Work: NONE  Testing/Procedures: Your physician has requested that you have an echocardiogram. Echocardiography is a painless test that uses sound waves to create images of your heart. It provides your doctor with information about the size and shape of your heart and how well your heart's chambers and valves are working. This procedure takes approximately one hour. There are no restrictions for this procedure. Please do NOT wear cologne, perfume, aftershave, or lotions (deodorant is allowed). Please arrive 15 minutes prior to your appointment time.  Please note: We ask at that you not bring children with you during ultrasound (echo/ vascular) testing. Due to room size and safety concerns, children are not allowed in the ultrasound rooms during exams. Our front office staff cannot provide observation of children in our lobby area while testing is being conducted. An adult accompanying a patient to their appointment will only be allowed in the ultrasound room at the discretion of the ultrasound technician under special circumstances. We apologize for any inconvenience.  Follow-Up: At [facility], you and your health needs are our priority.  As part of our continuing mission to provide you with exceptional heart care, our providers are all part of one team.  This team includes your primary Cardiologist (physician) and Advanced Practice Providers or APPs (Physician Assistants and Nurse Practitioners) who all work together to provide you with the care you need, when you need it.  Your next appointment:   12 month(s)  Provider:   Maudine Sos, MD, Slater Duncan, NP, or Neomi Banks, NP     We  recommend signing up for the patient portal called "MyChart".  Sign up information is provided on this After Visit Summary.  MyChart is used to connect with patients for Virtual Visits (Telemedicine).  Patients are able to view lab/test results, encounter notes, upcoming appointments, etc.  Non-urgent messages can be sent to your provider as well.   To learn more about what you can do with MyChart, go to [email or web address].

## 2023-09-30 NOTE — Progress Notes (Signed)
 Cardiology Office Note:  .    Date:  09/30/2023  ID:  Carla Patrick, DOB Oct 04, 1965, MRN [id number] PCP: Imelda Man, MD  [facility] Providers Cardiologist:  None     History of Present Illness: .    Carla Patrick is a 58 y.o. female with HFmrEF, asthma, mild OSA, breast cancer, hypertension, hyperlipidemia, and anxiety here for follow up of palpitations.  She previously saw Dr. Katheryne Pane 2015 for abnormal EKG.  EKG at that time revealed sinus rhythm with left axis deviation but no other abnormalities.  She has no symptoms and no further evaluation was needed at the time.  She saw Dr. Amanda Jungling 09/2021 after diagnosis of right breast cancer and was planning for tamoxifen  therapy.  She had an episode of lightheadedness while riding a bus.  She pulled over and had an episode of diarrhea but felt very lightheaded and as though she might die.  EKG at the time revealed LVH with repolarization abnormality unchanged from prior.  Coronary calcium  score 07/2020 was 0.  She also noted some occasional palpitations.  Overall that the situation was thought to be due to a vagal episode in the setting of abdominal discomfort.  No further evaluation was needed at the time.    She wore a Zio heart monitor in 10/2022 and was referred to cardiology by her PCP for further evaluation.  This revealed episodes of SVT and she was started on metoprolol .  She was also referred for sleep study which revealed mild OSA.  She also noted that home blood pressures were in the 130s to 150s.  She had an echo 04/2023 that revealed LVEF 41% with global hypokinesis, mild AR, mild MR, and mild-moderate TR.  She was started on Lasix and then Farxiga .  She declined transition of telmisartan  to Entresto.  She last saw Neomi Banks, NP 07/2023 and was feeling well.  She did continue to have some chest heaviness at rest.  She was unable to take Farxiga  due to dizziness and nausea.  She also reported forgetting to take her  rosuvastatin .  She was planning to see pulmonology due to elevated pulmonary pressures and considering right heart catheterization.  She has had infrequent episodes of heart palpitations associated with shortness of breath, weakness, diaphoresis. Her episodes have lasted for up to an hour, during which time her symptoms are consistent and she feels incapacitated and weak. At one time she had to walk across campus and was unsure if she would make it. During the episodes she states that "everything feels labored," although her dyspnea is unlike her asthmatic symptoms. The episodes have not been triggered by exertion. At home her blood pressures have ranged in the 130s-150s systolic. She had another clinic visit earlier this morning with a blood pressure of 133/78. In our office her blood pressure is 132/96 initially, and 146/102 on manual recheck. Of note, she did not yet take her medications this morning. Usually she does take them around 8 AM. She has consumed more caffeine lately, but not routinely. Alcohol consumption is very rare. Previously had lost some weight on Wegovy , but ultimately she was unable to tolerate the side effects and she stopped taking it. Her husband has said that she snores. She denies any chest pain, peripheral edema, lightheadedness, headaches, syncope, orthopnea, or PND.  Discussed the use of AI scribe software for clinical note transcription with the patient, who gave verbal consent to proceed.  History of Present Illness [name] experiences persistent fatigue and  dyspnea.  She recalls a severe respiratory illness prior to the onset of her current symptoms, which she describes as 'horrible'.  Her sleep patterns are irregular, often getting only three to four hours of sleep per night. She used to wake up frequently to care for her elderly dog, which disrupted her sleep further. She typically goes to bed around 2 AM and wakes up around 6:30 AM. A sleep study indicated mild sleep  apnea.  She is currently on Farxiga , metoprolol , and telmisartan  for heart failure, and takes Lasix every other day to manage fluid retention. She has noted that missing doses of her medications results in noticeable differences in her symptoms, indicating that the medications are beneficial.  She experiences episodes of cold sweats and has to stop all activities during these episodes, although they have become milder and less frequent. She does not identify as having anxiety but acknowledges that her symptoms worsen when she is anxious.  She is not currently engaging in regular physical exercise but plans to start using a treadmill her husband bought for her. She has also been working on weight loss, having noticed some initial success with Mounjaro, despite experiencing nausea as a side effect.  ROS:  As per HPI  Studies Reviewed: Aaron Aas       Coronary CT-A 06/2023: IMPRESSION: 1. Coronary calcium  score of 0. This was 0 percentile for age-, sex, and race-matched controls.   2. Total plaque volume 0 mm3 which is 0 percentile for age- and sex-matched controls (calcified plaque 0 mm3; non-calcified plaque 0 mm3). TPV is none.   3. Normal coronary origin with right dominance.   4. Normal coronary arteries.   5. Consider non atherosclerotic causes of chest pain.  Echo 05/2023:   Risk Assessment/Calculations:             Physical Exam:   VS:  BP 120/86   Pulse 86   Ht 4' 11.5" (1.511 m)   Wt 203 lb 6.4 oz (92.3 kg)   SpO2 96%   BMI 40.39 kg/m  , BMI Body mass index is 40.39 kg/m. GENERAL:  Well appearing HEENT: Pupils equal round and reactive, fundi not visualized, oral mucosa unremarkable NECK:  No jugular venous distention, waveform within normal limits, carotid upstroke brisk and symmetric, no bruits, no thyromegaly LUNGS:  Clear to auscultation bilaterally HEART:  RRR.  PMI not displaced or sustained,S1 and S2 within normal limits, no S3, no S4, no clicks, no rubs, no  murmurs ABD:  Flat, positive bowel sounds normal in frequency in pitch, no bruits, no rebound, no guarding, no midline pulsatile mass, no hepatomegaly, no splenomegaly EXT:  2 plus pulses throughout, no edema, no cyanosis no clubbing SKIN:  No rashes no nodules NEURO:  Cranial nerves II through XII grossly intact, motor grossly intact throughout PSYCH:  Cognitively intact, oriented to person place and time   ASSESSMENT AND PLAN: .    Assessment & Plan # HFmrEF:  # Hypertension:  Heart failure with ejection fraction of 40%. Etiology unclear, ischemic heart disease ruled out. 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  She also notes some family members have had heart failure.   - Repeat echocardiogram to assess heart function. - Continue Farxiga , metoprolol , telmisartan . - Monitor blood pressure at home, target <130/80 mmHg. - Encourage regular physical activity. - Consider spironolactone.  She is trying to avoid more medication  # Pulmonary hypertension Mild pulmonary hypertension possibly related to sleep apnea or asthma. Increased fatigue and breathing difficulties noted. - Encourage better  sleep habits. - Discuss oral appliance with dentist for sleep apnea.  # Mild sleep apnea Mild sleep apnea without significant oxygen desaturation. Poor sleep patterns contributing to fatigue. Hesitant about CPAP therapy. - Encourage better sleep hygiene. - Consider weight loss to alleviate symptoms. - Discuss oral appliance with dentist.  # Lymphedema Lymphedema managed with diuretics. Reports dehydration with daily Lasix, adjusted to every other day. - Continue Lasix as needed, balance fluid intake to avoid dehydration.  # Hyperlipidemia:  Continue rosuvastatin .    Dispo: f/u 6 months  Signed, Maudine Sos, MD

## 2023-11-02 ENCOUNTER — Ambulatory Visit (HOSPITAL_COMMUNITY)
Admission: RE | Admit: 2023-11-02 | Discharge: 2023-11-02 | Disposition: A | Discharge status: Home / Self Care | Source: Ambulatory Visit | Attending: Cardiovascular Disease | Admitting: Cardiovascular Disease

## 2023-11-02 DIAGNOSIS — I509 Heart failure, unspecified: Secondary | ICD-10-CM | POA: Insufficient documentation

## 2023-11-02 DIAGNOSIS — R002 Palpitations: Secondary | ICD-10-CM | POA: Diagnosis present

## 2023-11-02 DIAGNOSIS — I10 Essential (primary) hypertension: Secondary | ICD-10-CM | POA: Diagnosis present

## 2023-11-02 DIAGNOSIS — E7800 Pure hypercholesterolemia, unspecified: Secondary | ICD-10-CM | POA: Insufficient documentation

## 2023-11-02 DIAGNOSIS — I5021 Acute systolic (congestive) heart failure: Secondary | ICD-10-CM | POA: Diagnosis not present

## 2023-11-02 LAB — ECHOCARDIOGRAM COMPLETE
Area-P 1/2: 4.33 cm2
Est EF: 40
S' Lateral: 4.3 cm
Single Plane A2C EF: 40.7 %

## 2023-11-03 ENCOUNTER — Ambulatory Visit: Payer: Self-pay | Admitting: Cardiovascular Disease

## 2023-11-23 LAB — LAB REPORT - SCANNED
A1c: 6
Albumin, Urine POC: 19.8
Creatinine, POC: 146.7 mg/dL
EGFR: 72
Microalb Creat Ratio: 13

## 2023-12-06 IMAGING — CT CT CHEST W/O CM
2 of 5 series · 14 of 36 positions shown, 17 images · non-contrast
Comparison: None.

CLINICAL DATA: Follow-up small pulmonary nodules reported as
measuring 4 mm in the right middle and left lower lobes seen on a
prior cardiac calcium CT dated 08/06/2020.



[Series 4: chest 2.00 br40 s3 · coronal · 0.60mm/px · 3 of 152 slices shown]
[im 31/152  lung]
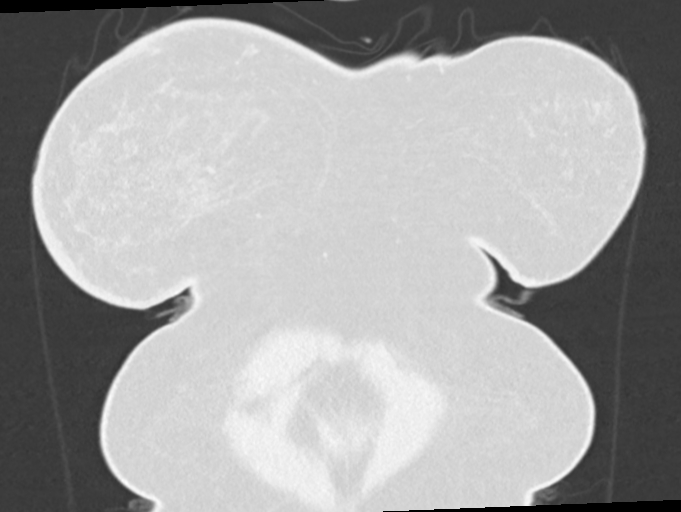
[im 61/152  lung]
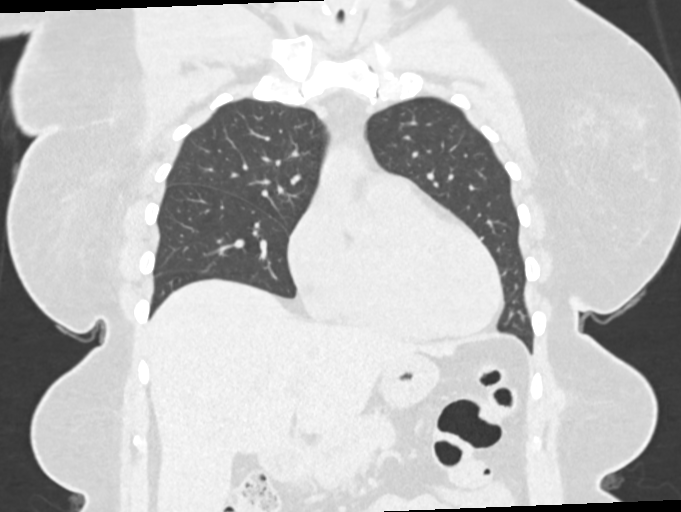
[im 91/152  lung]
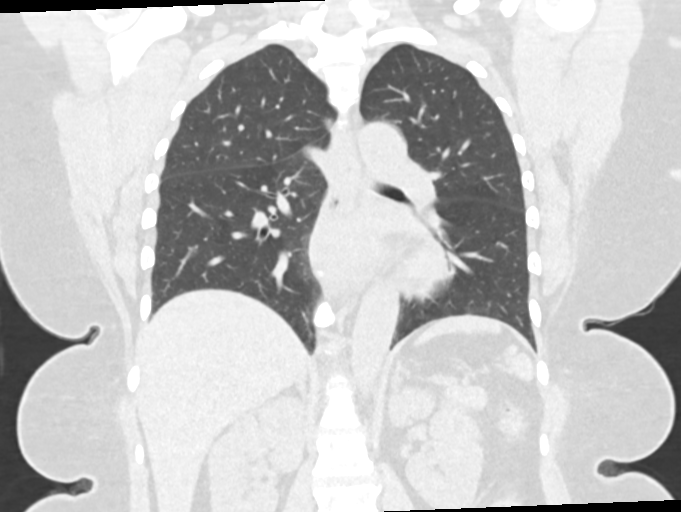

[Series 10: chest 1.00 br40 s3 super d · axial · 0.80mm/px · z∈[+1627,+1894]mm · 11 of 385 slices shown, 14 images]
[im 26/385  mediastinal]
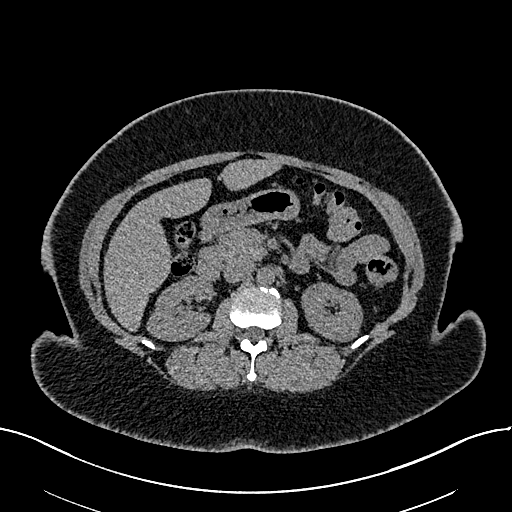
[im 26/385  lung]
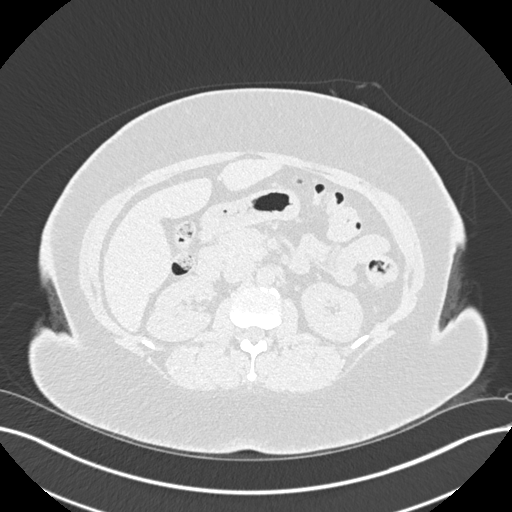
[im 52/385  lung]
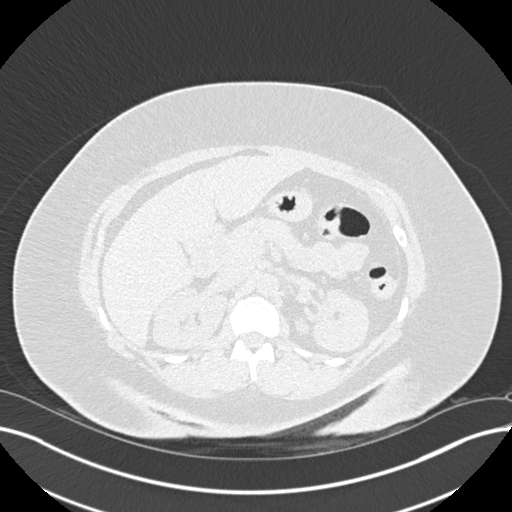
[im 103/385  lung]
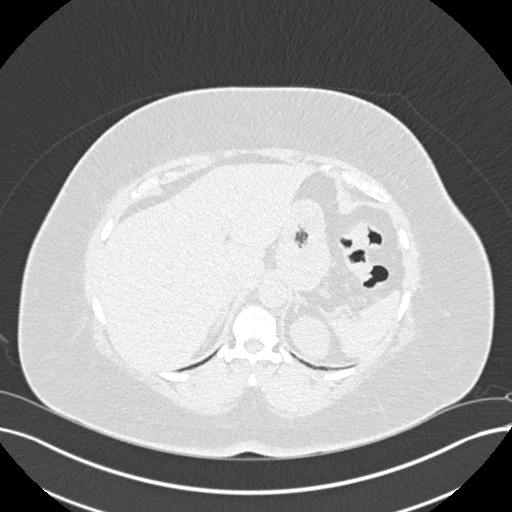
[im 129/385  lung]
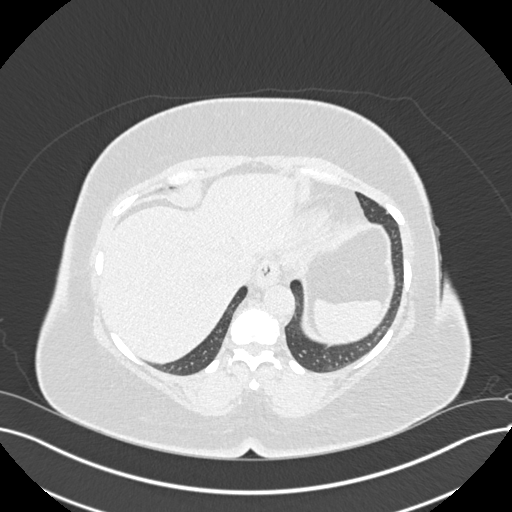
[im 154/385  mediastinal]
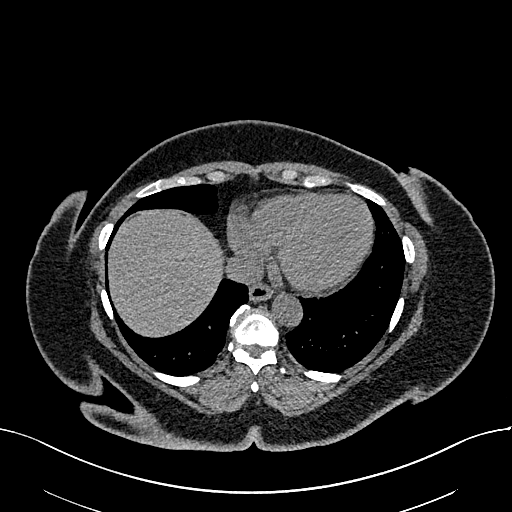
[im 154/385  lung]
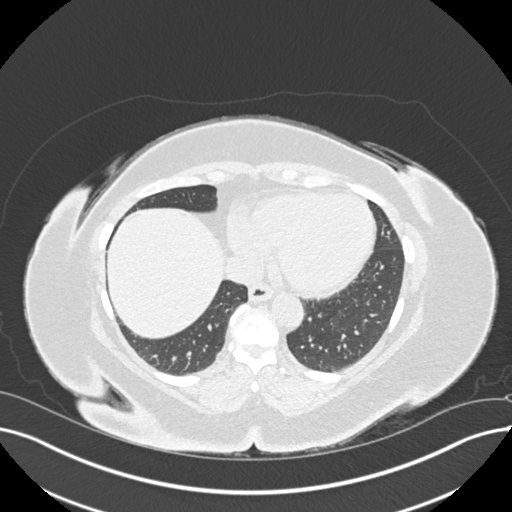
[im 205/385  lung]
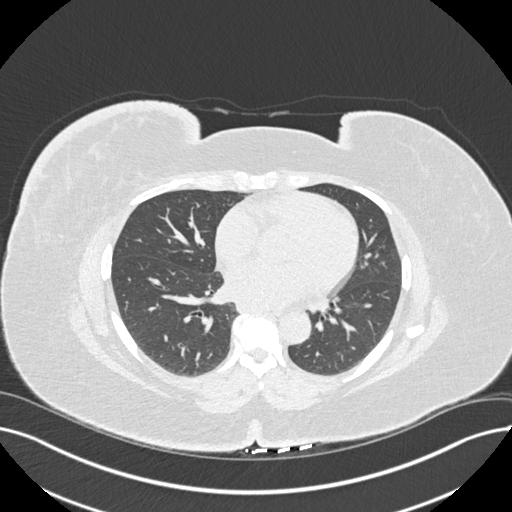
[im 231/385  lung]
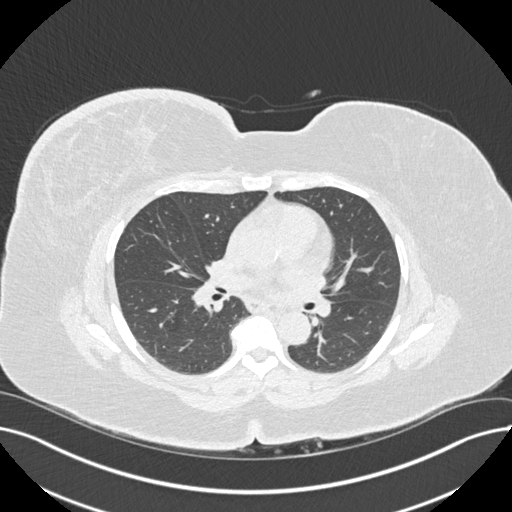
[im 257/385  lung]
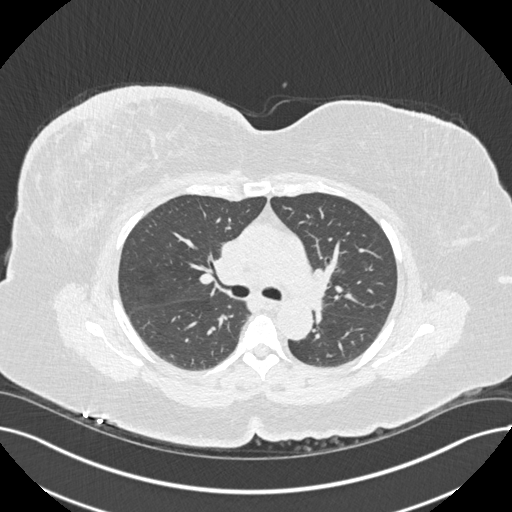
[im 282/385  mediastinal]
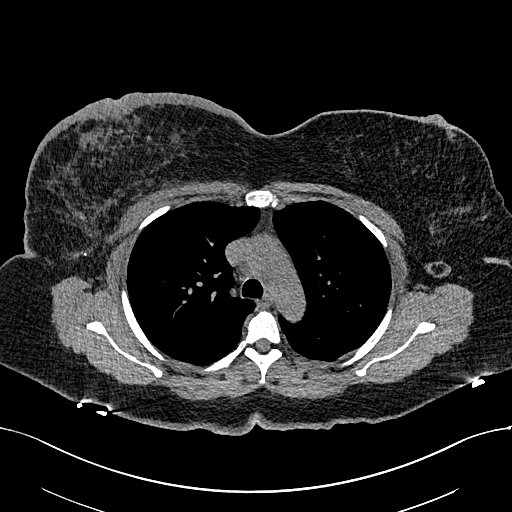
[im 282/385  lung]
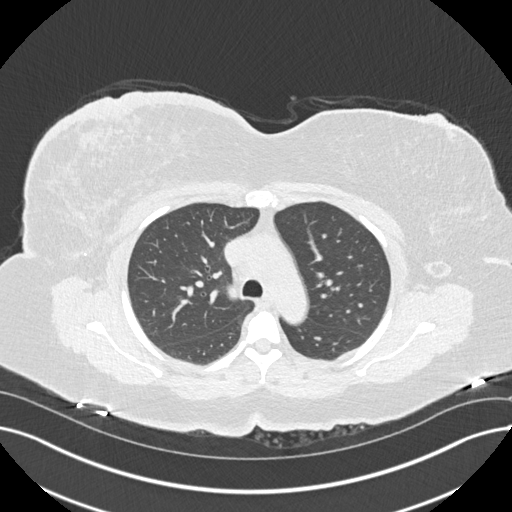
[im 333/385  lung]
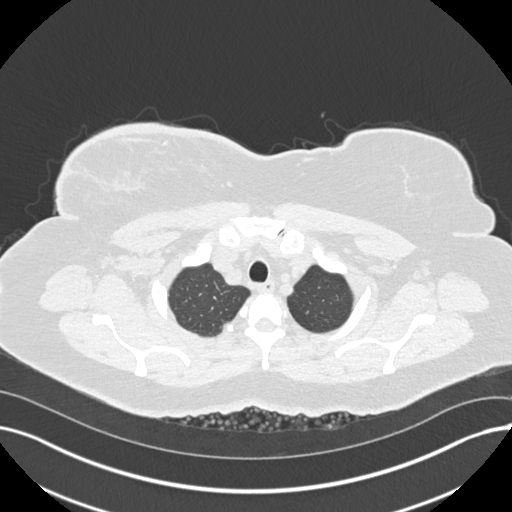
[im 359/385  lung]
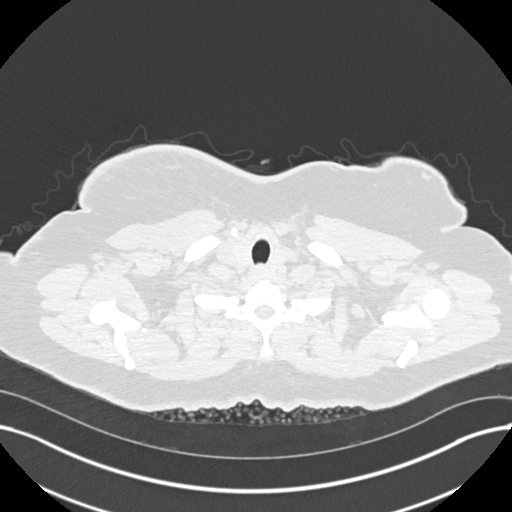

[14 of 36 positions shown; findings below may reference images not displayed]

FINDINGS: Cardiovascular: Limited evaluation in the absence of intravenous
contrast. Two vessel aortic arch. The right brachiocephalic and left
common carotid artery share a common origin. Trace atherosclerotic
calcifications along the ascending thoracic aorta. No aneurysm. Main
pulmonary artery is normal in size. The heart is normal in size. No
pericardial effusion.

Mediastinum/Nodes: Unremarkable CT appearance of the thyroid gland.
No suspicious mediastinal or hilar adenopathy. No soft tissue
mediastinal mass. The thoracic esophagus is unremarkable.

Lungs/Pleura: 4 mm right middle lobe pulmonary nodule (image 78
series 8). 3 mm left lower lobe pulmonary nodule (image 100 series
8). Based upon reported findings on the prior examination, these
nodules demonstrate no significant interval change. The lungs are
otherwise clear.

Upper Abdomen: Surgical changes of prior cholecystectomy. No acute
abnormality within the upper abdomen.

Musculoskeletal: Surgical changes of prior right breast lumpectomy.
There is diffuse skin thickening of the right breast measuring up to
1.6 cm in width versus 0.3 cm on the contralateral side. Surgical
changes of right axillary nodal dissection. No lymphadenopathy
visualized. No acute fracture or aggressive appearing lytic or
blastic osseous lesion.
IMPRESSION: 1. Small 4 and 3 mm pulmonary nodules in the right middle and left
lower lobes. Based on the prior report, there has been no
significant interval change over 1 year indicating that these are
almost certainly benign.
2. Diffuse skin thickening of the right breast with evidence of
prior right breast lumpectomy and axillary nodal dissection.
Differential considerations include chronic edema, post radiation
skin changes and possibly inflammatory breast carcinoma.

Aortic Atherosclerosis ([id number]).

## 2023-12-13 ENCOUNTER — Other Ambulatory Visit: Payer: Self-pay | Admitting: Nurse Practitioner

## 2023-12-13 DIAGNOSIS — C50411 Malignant neoplasm of upper-outer quadrant of right female breast: Secondary | ICD-10-CM

## 2023-12-13 NOTE — Progress Notes (Unsigned)
 Patient Care Team: Clarice Nottingham, MD as PCP - General (Internal Medicine) Glean Stephane BROCKS, RN (Inactive) as Oncology Nurse Navigator Tyree Nanetta SAILOR, RN as Oncology Nurse Navigator Curvin Deward MOULD, MD as Consulting Physician (General Surgery) Lanny Callander, MD as Consulting Physician (Hematology) Shannon Agent, MD as Consulting Physician (Radiation Oncology) Burton, Lacie K, NP as Nurse Practitioner (Nurse Practitioner)  Clinic Day:  12/15/2023  Referring physician: Clarice Nottingham, MD  ASSESSMENT & PLAN:   Assessment & Plan: Malignant neoplasm of upper-outer quadrant of right breast in female, 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 Rutherford, confirmed she is postmenopausal. S/p D&C/hysteroscopy which showed benign endometrial polyp -Ms. Kraska has recovered well, bleeding stopped. We discussed further anti-estrogen therapy.  -Due to her ER+ lobular histology and post-menopausal status, AI was recommended. Due to her baseline arthritis (knees), I recommend Aromasin . We reviewed potential SE's, she agrees to try -recent labs reviewed, which shows moderate anemia while she had vaginal bleeding. It was recommended she take prenatal vitamin with iron for now -Screening MRI done on 05/20/2023 was benign.   -08/26/2023 -diagnostic mammogram with benign results. - Encouraged her to try taking exemestane  routinely.  Recommended she take around dinnertime to prevent side effect of fatigue and sluggishness during working hours. -Breast MRI ordered for January 2026. - Plan for labs and follow-up in 6 months, sooner if needed.   Anemia Mild and stable iron deficiency  anemia.  Hgb 11.7 and HCT 34.4.  Normal ferritin at 120.  Normal iron studies with iron at 94, TIBC 342, sat ratio 28%, and transferrin 248.  Plan to continue to monitor iron studies with each visit and treat iron deficiency as indicated.  Right breast cancer Patient should be taking exemestane  25 mg daily.  States she has not been taking it routinely.  States that it makes her feel dizzy and tired after she takes it, feels offkilter.  Keeps saying that when she has time off or she is less busy at work, she will begin to take it more frequently give her body time to adjust to the medication.  States that that time just has not occurred yet.  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  The next month should be less busy and she will have some time off.  She will start taking this medication routinely.  I recommended that she try taking around dinnertime.  She has already tried taking it at nighttime.  It made her feel sluggish.  She had a difficult time getting started with her day.  Advised that an earlier time might prevent that from happening.  She stated she would give that a try.  Plan Reviewed labs. - CBC showing mild and stable anemia.  Iron studies and bands and ferritin are within normal limits.  Will monitor closely. -CMP is unremarkable. Reviewed diagnostic diagnostic mammogram from March 2025 which was benign. Due for screening MRI of bilateral breasts in January 2026.  Will order that as part of today's visit. Continue anastrozole 1 mg daily. Labs and follow-up in 6 months, sooner if needed.  The patient understands the plans discussed today and is in agreement with them.  She knows to contact our office if she develops concerns prior  to her next appointment.  I provided 25 minutes of face-to-face time during this encounter and > 50% was spent counseling as documented under my assessment and plan.    [name], NP  [facility]  - A DEPT OF [facility] [address] [place] [address] Dept: [phone] Dept Fax: [phone]   Orders Placed This Encounter  Procedures   MR BREAST BILATERAL W WO CONTRAST INC CAD    Standing Status:   Future    Expected Date:   06/16/2024    Expiration Date:   12/14/2024    If indicated for the ordered procedure, I authorize the administration of contrast media per Radiology protocol:   Yes    What is the patient's sedation requirement?:   No Sedation    Does the patient have a pacemaker or implanted devices?:   No    Radiology Contrast Protocol - do NOT remove file path:   [email or web address]    Preferred imaging location?:   [facility] (table limit-550lbs)   CBC with Differential (Cancer Center Only)    Standing Status:   Standing    Number of Occurrences:   6    Expiration Date:   12/14/2024   Iron and Iron Binding Capacity ([facility] only)    Standing Status:   Standing    Number of Occurrences:   6    Expiration Date:   12/14/2024   Ferritin    Standing Status:   Standing    Number of Occurrences:   6    Expiration Date:   12/14/2024   CMP (Cancer Center only)    Standing Status:   Standing    Number of Occurrences:   6    Expiration Date:   12/14/2024      CHIEF COMPLAINT:  CC: Right breast cancer, estrogen receptor positive  Current Treatment: Exemestane  25 mg daily (02/12/2023)  INTERVAL HISTORY:  Aunesty is here today for repeat clinical assessment.  She was last seen by myself on 06/09/2023.  3D diagnostic mammogram was done on 08/26/2023.  Results were benign.  She states she has had a difficult time taking exemestane  routinely.  States it makes her feel tired.  She tried taking it at night and it caused her to feel sluggish with decreased energy the next day.  She understands the purpose of taking this medication wants to do better with it.  States that over the next month, she should have  a less busy work schedule along with a few days off.  She will try to get into the routine of taking this every day.  She denies any new problems or concerns with either breast.  She denies chest pain, chest pressure, or shortness of breath. She denies headaches or visual disturbances. She denies abdominal pain, nausea, vomiting, or changes in bowel or bladder habits.   She denies fevers or chills. She denies pain. Her appetite is good. Her weight has been stable.  I have reviewed the past medical history, past surgical history, social history and family history with the patient and they are unchanged from previous note.  ALLERGIES:  has no known allergies.  MEDICATIONS:  Current Outpatient Medications  Medication Sig Dispense Refill   albuterol  (PROAIR  HFA) 108 (90 Base) MCG/ACT inhaler Inhale 1-2 puffs into the lungs every 6 (six) hours as needed for wheezing or shortness of breath. 8 g 11   dapagliflozin  propanediol (FARXIGA )  10 MG TABS tablet Take 1 tablet (10 mg total) by mouth daily before breakfast. 90 tablet 3   exemestane  (AROMASIN ) 25 MG tablet Take 1 tablet (25 mg total) by mouth daily after breakfast. 30 tablet 6   furosemide (LASIX) 20 MG tablet Take 20 mg by mouth daily.     ibuprofen  (ADVIL ) 800 MG tablet Take 1 tablet (800 mg total) by mouth every 8 (eight) hours as needed. 30 tablet 11   metoprolol  succinate (TOPROL -XL) 50 MG 24 hr tablet Take 1 tablet (50 mg total) by mouth daily. Take with or immediately following a meal. 90 tablet 3   potassium chloride SA (KLOR-CON M) 20 MEQ tablet Take 20 mEq by mouth 2 (two) times daily.     rosuvastatin  (CRESTOR ) 20 MG tablet Take 1 tablet (20 mg total) by mouth daily. 90 tablet 3   SYMBICORT  160-4.5 MCG/ACT inhaler Inhale 2 puffs into the lungs in the morning and at bedtime. 1 each 11   telmisartan  (MICARDIS ) 40 MG tablet Take 1 tablet (40 mg total) by mouth daily. 90 tablet 1   tirzepatide (MOUNJARO) 2.5 MG/0.5ML Pen Inject 2.5 mg into  the skin once a week.     No current facility-administered medications for this visit.    HISTORY OF PRESENT ILLNESS:   Oncology History Overview Note   Cancer Staging  Malignant neoplasm of upper-outer quadrant of right breast in female, estrogen receptor positive (HCC) Staging form: Breast, AJCC 8th Edition - Clinical stage from 12/26/2020: Stage IA (cT1b, cN0, cM0, G2, ER+, PR-, HER2-) - Unsigned Stage prefix: Initial diagnosis Histologic grading system: 3 grade system Staged by: Pathologist and managing physician Stage used in treatment planning: Yes National guidelines used in treatment planning: Yes Type of national guideline used in treatment planning: NCCN - Pathologic stage from 01/12/2021: Stage IA (pT1c, pN0(i+), cM0, G2, ER+, PR-, HER2-, Oncotype DX score: 22) - Signed by Lanny Callander, MD on 04/27/2021 Stage prefix: Initial diagnosis Multigene prognostic tests performed: Oncotype DX Recurrence score range: Greater than or equal to 11 Histologic grading system: 3 grade system Residual tumor (R): R0 - None    Malignant neoplasm of upper-outer quadrant of right breast in female, estrogen receptor positive (HCC)  12/05/2020 Mammogram   Right Breast Diagnostic Mammogram; Right Breast Ultrasound  IMPRESSION: The 0.4 x 0.5 x 0.7 cm taller-than-wide irregular mass in the right breast is suspicious of malignancy. No significant abnormalities were seen sonographically in the right axilla.   12/17/2020 Pathology Results   Diagnosis:  Breast, right, needle core biopsy, 12 O'CLOCK middle depth, 9 cmfn - INVASIVE MAMMARY CARCINOMA - MAMMARY CARCINOMA IN SITU  The carcinoma appears Nottingham grade 2 of 3. E-cadherin is negative in the invasive carcinoma, supporting lobular origin.  PROGNOSTIC INDICATORS Results: IMMUNOHISTOCHEMICAL AND MORPHOMETRIC ANALYSIS PERFORMED MANUALLY The tumor cells are EQUIVOCAL for Her2 (2+). Her2 by FISH will be performed and results reported  separately. Estrogen Receptor: 90%, POSITIVE, MODERATE STAINING INTENSITY Progesterone Receptor: 0%, NEGATIVE Proliferation Marker Ki67: 1%  FLUORESCENCE IN-SITU HYBRIDIZATION Results: GROUP 5: HER 2 **NEGATIVE** Equivocal form of amplification of the HER2 gene was detected in the IHC 2+ tissue sample received from this individual. HER2 FISH was performed by a technologist and cell imaging and analysis on the [employer]. RATIO OF ER2/CEN17 SIGNALS 1.15 AVERAGE HER2 COPY NUMBER PER CELL 1.95   12/24/2020 Initial Diagnosis   Malignant neoplasm of upper-outer quadrant of right breast in female, estrogen receptor positive (HCC)   01/12/2021 Cancer Staging  Staging form: Breast, AJCC 8th Edition - Pathologic stage from 01/12/2021: Stage IA (pT1c, pN0(i+), cM0, G2, ER+, PR-, HER2-, Oncotype DX score: 22) - Signed by Lanny Callander, MD on 04/27/2021 Stage prefix: Initial diagnosis Multigene prognostic tests performed: Oncotype DX Recurrence score range: Greater than or equal to 11 Histologic grading system: 3 grade system Residual tumor (R): R0 - None   01/30/2021 Definitive Surgery   FINAL MICROSCOPIC DIAGNOSIS:   A. LYMPH NODE, RIGHT AXILLARY #1, SENTINEL, EXCISION:  -  Isolated tumor cells identified in one lymph node (0i/1)  -  See comment   B. LYMPH NODE, RIGHT AXILLARY, SENTINEL, EXCISION:  -  No carcinoma identified in one lymph node (0/1)  -  See comment   C. LYMPH NODE, RIGHT AXILLARY #2, SENTINEL, EXCISION:  -  No carcinoma identified in one lymph node (0/1)  -  See comment   D. LYMPH NODE, RIGHT AXILLARY, SENTINEL, EXCISION:  -  No carcinoma identified in one lymph node (0/1)  -  See comment   E. LYMPH NODE, RIGHT AXILLARY #3, SENTINEL, EXCISION:  -  No carcinoma identified in one lymph node (0/1)  -  See comment   F. LYMPH NODE, RIGHT AXILLARY #4, SENTINEL, EXCISION:  -  No carcinoma identified in one lymph node (0/1)  -  See comment   G. LYMPH NODE, RIGHT AXILLARY,  SENTINEL, EXCISION:  -  No carcinoma identified in one lymph node (0/1)  -  See comment   H. LYMPH NODE, RIGHT AXILLARY #5, SENTINEL, EXCISION:  -  Benign fibroadipose tissue  -  No lymphoid tissue identified   I. BREAST, RIGHT, LUMPECTOMY:  -  Invasive lobular carcinoma, Nottingham grade 2 of 3, 1.5 cm  -  Lobular neoplasia (atypical lobular hyperplasia)  -  Margins uninvolved by carcinoma (0.7 cm; inferior margin)  -  Previous biopsy site changes present  -  See oncology table and comment below    01/30/2021 Miscellaneous   Oncotype Recurrence Score of 22, risk of distant metastasis is 8% with antiestrogen therapy alone.   03/18/2021 - 04/15/2021 Radiation Therapy   Site Technique Total Dose (Gy) Dose per Fx (Gy) Completed Fx Beam Energies  Breast, Right: Breast_Rt 3D 40.05/40.05 2.67 15/15 10X  Breast, Right: Breast_Rt_Bst 3D 10/10 2 5/5 6X, 10X     05/2021 - 08/2022 Anti-estrogen oral therapy   Took low dose Tamoxifen  intermittently, stopped 08/2022. Did not tolerate full dose due to SE's. After she stopped, pt developed abnormal vaginal bleeding.    12/16/2022 Survivorship   SCP delivered by Lacie Burton, NP   05/20/2023 Imaging   Bilateral breast MRI with and without contrast IMPRESSION: 1. No evidence of new or recurrent breast carcinoma. 2. Benign post lumpectomy changes on the right  RECOMMENDATION: 1. Annual screening mammography, last bilateral exam performed on 08/15/2022. 2. Consider supplemental high risk screening breast MRI given patient's personal history breast malignancy.  BI-RADS CATEGORY  2: Benign.       REVIEW OF SYSTEMS:   Constitutional: Denies fevers, chills or abnormal weight loss.  Fatigue when taking exemestane . Eyes: Denies blurriness of vision Ears, nose, mouth, throat, and face: Denies mucositis or sore throat Respiratory: Denies cough, dyspnea or wheezes Cardiovascular: Denies palpitation, chest discomfort or lower extremity  swelling Gastrointestinal:  Denies nausea, heartburn or change in bowel habits Skin: Denies abnormal skin rashes Lymphatics: Denies new lymphadenopathy or easy bruising Neurological:Denies numbness, tingling or new weaknesses Behavioral/Psych: Mood is stable, no new changes  All other  systems were reviewed with the patient and are negative.   VITALS:   Today's Vitals   12/14/23 1034  BP: 138/88  Pulse: 82  Resp: 17  Temp: 98.2 F (36.8 C)  SpO2: 95%  Weight: 206 lb 6.4 oz (93.6 kg)   Body mass index is 40.99 kg/m.   Wt Readings from Last 3 Encounters:  12/14/23 206 lb 6.4 oz (93.6 kg)  09/30/23 203 lb 6.4 oz (92.3 kg)  09/23/23 206 lb (93.4 kg)    Body mass index is 40.99 kg/m.  Performance status (ECOG): 1 - Symptomatic but completely ambulatory  PHYSICAL EXAM:   GENERAL:alert, no distress and comfortable SKIN: skin color, texture, turgor are normal, no rashes or significant lesions EYES: normal, Conjunctiva are pink and non-injected, sclera clear OROPHARYNX:no exudate, no erythema and lips, buccal mucosa, and tongue normal  NECK: supple, thyroid normal size, non-tender, without nodularity LYMPH:  no palpable lymphadenopathy in the cervical, axillary or inguinal LUNGS: clear to auscultation and percussion with normal breathing effort HEART: regular rate & rhythm and no murmurs and no lower extremity edema ABDOMEN:abdomen soft, non-tender and normal bowel sounds Musculoskeletal:no cyanosis of digits and no clubbing  NEURO: alert & oriented x 3 with fluent speech, no focal motor/sensory deficits BREAST: There are no palpable lumps or masses in the right breast today.  There is well-healed surgical lumpectomy scar noted.  There is no nipple inversion or nipple discharge.  There is no axillary lymphadenopathy on the right.  There are no palpable lumps or masses in the left breast.  There are is no nipple inversion or nipple discharge.  There is no axillary lymphadenopathy  on the left.  LABORATORY DATA:  I have reviewed the data as listed    Component Value Date/Time   NA 143 12/14/2023 0959   NA 144 09/20/2021 1122   K 3.9 12/14/2023 0959   CL 110 12/14/2023 0959   CO2 26 12/14/2023 0959   GLUCOSE 143 (H) 12/14/2023 0959   BUN 10 12/14/2023 0959   BUN 10 09/20/2021 1122   CREATININE 0.83 12/14/2023 0959   CALCIUM  9.2 12/14/2023 0959   PROT 6.7 12/14/2023 0959   PROT 6.7 09/20/2021 1122   ALBUMIN 4.1 12/14/2023 0959   ALBUMIN 4.4 09/20/2021 1122   AST 13 (L) 12/14/2023 0959   ALT 18 12/14/2023 0959   ALKPHOS 76 12/14/2023 0959   BILITOT 0.8 12/14/2023 0959   GFRNONAA >60 12/14/2023 0959   GFRAA >90 10/05/2011 0443    Lab Results  Component Value Date   WBC 6.9 12/14/2023   NEUTROABS 3.8 12/14/2023   HGB 11.7 (L) 12/14/2023   HCT 34.4 (L) 12/14/2023   MCV 95.0 12/14/2023   PLT 322 12/14/2023

## 2023-12-13 NOTE — Assessment & Plan Note (Signed)
 stage IA right breast invasive lobular carcinoma, ER+/PR-/HER2- pT1bN0iM0 -Diagnosed in 11/2020, s/p lumpectomy and adjuvant radiation therapy -She took some anti-estrogen therapy with Tamoxifen  intermittently from 05/2021 - 08/2022; tolerated moderately well with fatigue but she stopped due to vaginal bleeding -I reviewed the case with ob/gyn Dr. Rutherford, confirmed she is postmenopausal. S/p D&C/hysteroscopy which showed benign endometrial polyp -Carla Patrick has recovered well, bleeding stopped. We discussed further anti-estrogen therapy.  -Due to her ER+ lobular histology and post-menopausal status, AI was recommended. Due to her baseline arthritis (knees), I recommend Aromasin . We reviewed potential SE's, she agrees to try -recent labs reviewed, which shows moderate anemia while she had vaginal bleeding. It was recommended she take prenatal vitamin with iron for now -Screening MRI done on 05/20/2023 was benign.  She will have 3D screening mammogram in July 2025. -08/26/2023 -diagnostic mammogram with benign results.

## 2023-12-14 ENCOUNTER — Inpatient Hospital Stay (HOSPITAL_BASED_OUTPATIENT_CLINIC_OR_DEPARTMENT_OTHER): Payer: 59 | Admitting: Nurse Practitioner

## 2023-12-14 ENCOUNTER — Inpatient Hospital Stay: Payer: 59 | Attending: Nurse Practitioner

## 2023-12-14 VITALS — BP 138/88 | HR 82 | Temp 98.2°F | Resp 17 | Wt 206.4 lb

## 2023-12-14 DIAGNOSIS — D509 Iron deficiency anemia, unspecified: Secondary | ICD-10-CM | POA: Insufficient documentation

## 2023-12-14 DIAGNOSIS — Z79811 Long term (current) use of aromatase inhibitors: Secondary | ICD-10-CM | POA: Diagnosis not present

## 2023-12-14 DIAGNOSIS — C50411 Malignant neoplasm of upper-outer quadrant of right female breast: Secondary | ICD-10-CM

## 2023-12-14 DIAGNOSIS — Z170 Estrogen receptor positive status [ER+]: Secondary | ICD-10-CM

## 2023-12-14 LAB — CBC WITH DIFFERENTIAL (CANCER CENTER ONLY)
Abs Immature Granulocytes: 0.02 K/uL (ref 0.00–0.07)
Basophils Absolute: 0.1 K/uL (ref 0.0–0.1)
Basophils Relative: 1 %
Eosinophils Absolute: 0.2 K/uL (ref 0.0–0.5)
Eosinophils Relative: 2 %
HCT: 34.4 % — ABNORMAL LOW (ref 36.0–46.0)
Hemoglobin: 11.7 g/dL — ABNORMAL LOW (ref 12.0–15.0)
Immature Granulocytes: 0 %
Lymphocytes Relative: 34 %
Lymphs Abs: 2.4 K/uL (ref 0.7–4.0)
MCH: 32.3 pg (ref 26.0–34.0)
MCHC: 34 g/dL (ref 30.0–36.0)
MCV: 95 fL (ref 80.0–100.0)
Monocytes Absolute: 0.5 K/uL (ref 0.1–1.0)
Monocytes Relative: 7 %
Neutro Abs: 3.8 K/uL (ref 1.7–7.7)
Neutrophils Relative %: 56 %
Platelet Count: 322 K/uL (ref 150–400)
RBC: 3.62 MIL/uL — ABNORMAL LOW (ref 3.87–5.11)
RDW: 12.5 % (ref 11.5–15.5)
WBC Count: 6.9 K/uL (ref 4.0–10.5)
nRBC: 0 % (ref 0.0–0.2)

## 2023-12-14 LAB — CMP (CANCER CENTER ONLY)
ALT: 18 U/L (ref 0–44)
AST: 13 U/L — ABNORMAL LOW (ref 15–41)
Albumin: 4.1 g/dL (ref 3.5–5.0)
Alkaline Phosphatase: 76 U/L (ref 38–126)
Anion gap: 7 (ref 5–15)
BUN: 10 mg/dL (ref 6–20)
CO2: 26 mmol/L (ref 22–32)
Calcium: 9.2 mg/dL (ref 8.9–10.3)
Chloride: 110 mmol/L (ref 98–111)
Creatinine: 0.83 mg/dL (ref 0.44–1.00)
GFR, Estimated: >60 mL/min (ref >60)
Glucose, Bld: 143 mg/dL — ABNORMAL HIGH (ref 70–99)
Potassium: 3.9 mmol/L (ref 3.5–5.1)
Sodium: 143 mmol/L (ref 135–145)
Total Bilirubin: 0.8 mg/dL (ref 0.0–1.2)
Total Protein: 6.7 g/dL (ref 6.5–8.1)

## 2023-12-14 LAB — IRON AND IRON BINDING CAPACITY (CC-WL,HP ONLY)
Iron: 94 ug/dL (ref 28–170)
Saturation Ratios: 28 % (ref 10.4–31.8)
TIBC: 342 ug/dL (ref 250–450)
UIBC: 248 ug/dL (ref 148–442)

## 2023-12-14 LAB — FERRITIN: Ferritin: 120 ng/mL (ref 11–307)

## 2023-12-15 ENCOUNTER — Encounter: Payer: Self-pay | Admitting: Nurse Practitioner

## 2023-12-18 ENCOUNTER — Telehealth: Payer: Self-pay | Admitting: Nurse Practitioner

## 2023-12-18 NOTE — Telephone Encounter (Signed)
 Scheduled appointments per 7/14 los. Called and left a VM with appointment details for the patient.

## 2024-01-01 LAB — COLOGUARD: COLOGUARD: NEGATIVE

## 2024-01-06 ENCOUNTER — Ambulatory Visit: Payer: Self-pay | Admitting: Cardiovascular Disease

## 2024-04-13 ENCOUNTER — Encounter (HOSPITAL_BASED_OUTPATIENT_CLINIC_OR_DEPARTMENT_OTHER): Payer: Self-pay | Admitting: Cardiovascular Disease

## 2024-06-07 ENCOUNTER — Encounter (HOSPITAL_BASED_OUTPATIENT_CLINIC_OR_DEPARTMENT_OTHER): Payer: Self-pay | Admitting: Family

## 2024-06-16 ENCOUNTER — Inpatient Hospital Stay: Admitting: Hematology

## 2024-06-16 ENCOUNTER — Inpatient Hospital Stay

## 2024-06-16 NOTE — Assessment & Plan Note (Signed)
 Stage IA right breast invasive lobular carcinoma, ER+/PR-/HER2- pT1bN0iM0 -Diagnosed in 11/2020, s/p lumpectomy and adjuvant radiation therapy -She took some anti-estrogen therapy with Tamoxifen  intermittently from 05/2021 - 08/2022; tolerated moderately well with fatigue but she stopped due to vaginal bleeding -I reviewed the case with ob/gyn Dr. Rutherford, confirmed she is postmenopausal. S/p D&C/hysteroscopy which showed benign endometrial polyp -she started exemestane  in September 2024, she does not tolerate it very well and has been taking it intermittently.

## 2024-06-19 NOTE — Assessment & Plan Note (Signed)
 Stage IA right breast invasive lobular carcinoma, ER+/PR-/HER2- pT1bN0iM0 -Diagnosed in 11/2020, s/p lumpectomy and adjuvant radiation therapy -She took some anti-estrogen therapy with Tamoxifen  intermittently from 05/2021 - 08/2022; tolerated moderately well with fatigue but she stopped due to vaginal bleeding -I reviewed the case with ob/gyn Dr. Rutherford, confirmed she is postmenopausal. S/p D&C/hysteroscopy which showed benign endometrial polyp -she started exemestane  in September 2024, she does not tolerate it very well and has been taking it intermittently.

## 2024-06-20 ENCOUNTER — Inpatient Hospital Stay: Admitting: Hematology

## 2024-06-20 ENCOUNTER — Inpatient Hospital Stay: Attending: Hematology

## 2024-06-20 VITALS — BP 133/85 | HR 77 | Temp 98.1°F | Resp 17 | Wt 193.6 lb

## 2024-06-20 DIAGNOSIS — C50411 Malignant neoplasm of upper-outer quadrant of right female breast: Secondary | ICD-10-CM | POA: Insufficient documentation

## 2024-06-20 DIAGNOSIS — Z79811 Long term (current) use of aromatase inhibitors: Secondary | ICD-10-CM | POA: Insufficient documentation

## 2024-06-20 DIAGNOSIS — Z923 Personal history of irradiation: Secondary | ICD-10-CM | POA: Diagnosis not present

## 2024-06-20 DIAGNOSIS — Z170 Estrogen receptor positive status [ER+]: Secondary | ICD-10-CM

## 2024-06-20 DIAGNOSIS — K5900 Constipation, unspecified: Secondary | ICD-10-CM | POA: Diagnosis not present

## 2024-06-20 DIAGNOSIS — I38 Endocarditis, valve unspecified: Secondary | ICD-10-CM | POA: Diagnosis not present

## 2024-06-20 LAB — CMP (CANCER CENTER ONLY)
ALT: 28 U/L (ref 0–44)
AST: 18 U/L (ref 15–41)
Albumin: 4.6 g/dL (ref 3.5–5.0)
Alkaline Phosphatase: 90 U/L (ref 38–126)
Anion gap: 11 (ref 5–15)
BUN: 9 mg/dL (ref 6–20)
CO2: 26 mmol/L (ref 22–32)
Calcium: 9.6 mg/dL (ref 8.9–10.3)
Chloride: 104 mmol/L (ref 98–111)
Creatinine: 0.9 mg/dL (ref 0.44–1.00)
GFR, Estimated: >60 mL/min
Glucose, Bld: 115 mg/dL — ABNORMAL HIGH (ref 70–99)
Potassium: 4.1 mmol/L (ref 3.5–5.1)
Sodium: 141 mmol/L (ref 135–145)
Total Bilirubin: 0.8 mg/dL (ref 0.0–1.2)
Total Protein: 7.3 g/dL (ref 6.5–8.1)

## 2024-06-20 LAB — CBC WITH DIFFERENTIAL (CANCER CENTER ONLY)
Abs Immature Granulocytes: 0.02 K/uL (ref 0.00–0.07)
Basophils Absolute: 0.1 K/uL (ref 0.0–0.1)
Basophils Relative: 1 %
Eosinophils Absolute: 0.1 K/uL (ref 0.0–0.5)
Eosinophils Relative: 2 %
HCT: 35.6 % — ABNORMAL LOW (ref 36.0–46.0)
Hemoglobin: 12.1 g/dL (ref 12.0–15.0)
Immature Granulocytes: 0 %
Lymphocytes Relative: 37 %
Lymphs Abs: 2.2 K/uL (ref 0.7–4.0)
MCH: 32.1 pg (ref 26.0–34.0)
MCHC: 34 g/dL (ref 30.0–36.0)
MCV: 94.4 fL (ref 80.0–100.0)
Monocytes Absolute: 0.5 K/uL (ref 0.1–1.0)
Monocytes Relative: 8 %
Neutro Abs: 3 K/uL (ref 1.7–7.7)
Neutrophils Relative %: 52 %
Platelet Count: 355 K/uL (ref 150–400)
RBC: 3.77 MIL/uL — ABNORMAL LOW (ref 3.87–5.11)
RDW: 12.5 % (ref 11.5–15.5)
WBC Count: 5.8 K/uL (ref 4.0–10.5)
nRBC: 0 % (ref 0.0–0.2)

## 2024-06-20 LAB — IRON AND IRON BINDING CAPACITY (CC-WL,HP ONLY)
Iron: 123 ug/dL (ref 28–170)
Saturation Ratios: 36 % — ABNORMAL HIGH (ref 10.4–31.8)
TIBC: 344 ug/dL (ref 250–450)
UIBC: 221 ug/dL

## 2024-06-20 LAB — FERRITIN: Ferritin: 233 ng/mL (ref 11–307)

## 2024-06-20 NOTE — Progress Notes (Signed)
 " [facility]   Telephone:([phone] Fax:([phone]) [phone]   Clinic Follow up Note   Patient Care Team: Clarice Nottingham, MD as PCP - General (Internal Medicine) Tyree Nanetta SAILOR, RN as Oncology Nurse Navigator Curvin Deward MOULD, MD as Consulting Physician (General Surgery) Lanny Callander, MD as Consulting Physician (Hematology) Shannon Agent, MD as Consulting Physician (Radiation Oncology) Burton, Lacie K, NP as Nurse Practitioner (Nurse Practitioner)  Date of Service:  06/20/2024  CHIEF COMPLAINT: f/u of right breast cancer  CURRENT THERAPY:  Adjuvant exemestane , intermittently  Oncology History   Malignant neoplasm of upper-outer quadrant of right breast in female, estrogen receptor positive (HCC) Stage IA right breast invasive lobular carcinoma, ER+/PR-/HER2- [id number] -Diagnosed in 11/2020, s/p lumpectomy and adjuvant radiation therapy -She took some anti-estrogen therapy with Tamoxifen  intermittently from 05/2021 - 08/2022; tolerated moderately well with fatigue but she stopped due to vaginal bleeding -I reviewed the case with ob/gyn Dr. Rutherford, confirmed she is postmenopausal. S/p D&C/hysteroscopy which showed benign endometrial polyp -she started exemestane  in September 2024, she does not tolerate it very well and has been taking it intermittently.  Assessment & Plan Estrogen receptor positive right breast cancer She is three years into adjuvant endocrine therapy for lobular carcinoma, currently on exemestane  following prior tamoxifen . She experiences significant fatigue and hot flashes with exemestane , resulting in intermittent nonadherence and dose modifications, which impact her daily activities and participation in theater. She remains able to perform desk work and light activities (ECOG 0). Imaging (mammogram and MRI) in the past year have been benign, and she reports no new breast symptoms. The plan is to continue endocrine therapy for a minimum of five years, with a  goal of seven years if tolerated. Risks of ongoing therapy include persistent fatigue and hot flashes, which may affect quality of life and adherence. - Discussed strategies to improve exemestane  tolerability, including dose reduction to half tablet daily or three times per week during periods of increased activity or pronounced side effects. - Advised consistent exemestane  use, even at reduced dose, rather than extended interruptions. - Reviewed lifestyle modifications (diet, sleep, exercise) to manage side effects. - Confirmed plan for at least five years of endocrine therapy, with goal of seven years if tolerated. - Next mammogram due March; advised to keep appointment. - Scheduled follow-up in six months.  History of anemia She has had mild anemia over the past two years, with a nadir hemoglobin of 9.4 g/dL in August 7975. Etiology remains unclear, but anemia has resolved without intervention; current hemoglobin is 12.1 g/dL. She reports no ongoing breast symptoms or concerns related to anemia. - Reviewed recent laboratory results showing resolution of anemia. - No further intervention required at this time.  Plan - Due to the fatigue and hot flashes, she has been taking exemestane  intermittently.  Will reduce dose to 12.5 mg daily 3 days a week, and continue 25 mg daily for the rest of week - Lab and follow-up in 6 months. - She is due for mammogram in March 2026, she will call [facility] to schedule.  SUMMARY OF ONCOLOGIC HISTORY: Oncology History Overview Note   Cancer Staging  Malignant neoplasm of upper-outer quadrant of right breast in female, estrogen receptor positive (HCC) Staging form: Breast, AJCC 8th Edition - Clinical stage from 12/26/2020: Stage IA (cT1b, cN0, cM0, G2, ER+, PR-, HER2-) - Unsigned Stage prefix: Initial diagnosis Histologic grading system: 3 grade system Staged by: Pathologist and managing physician Stage used in treatment planning: Yes National guidelines used  in treatment planning: Yes Type of national guideline used in treatment planning: NCCN - Pathologic stage from 01/12/2021: Stage IA (pT1c, pN0(i+), cM0, G2, ER+, PR-, HER2-, Oncotype DX score: 22) - Signed by Lanny Callander, MD on 04/27/2021 Stage prefix: Initial diagnosis Multigene prognostic tests performed: Oncotype DX Recurrence score range: Greater than or equal to 11 Histologic grading system: 3 grade system Residual tumor (R): R0 - None    Malignant neoplasm of upper-outer quadrant of right breast in female, estrogen receptor positive (HCC)  12/05/2020 Mammogram   Right Breast Diagnostic Mammogram; Right Breast Ultrasound  IMPRESSION: The 0.4 x 0.5 x 0.7 cm taller-than-wide irregular mass in the right breast is suspicious of malignancy. No significant abnormalities were seen sonographically in the right axilla.   12/17/2020 Pathology Results   Diagnosis:  Breast, right, needle core biopsy, 12 O'CLOCK middle depth, 9 cmfn - INVASIVE MAMMARY CARCINOMA - MAMMARY CARCINOMA IN SITU  The carcinoma appears Nottingham grade 2 of 3. E-cadherin is negative in the invasive carcinoma, supporting lobular origin.  PROGNOSTIC INDICATORS Results: IMMUNOHISTOCHEMICAL AND MORPHOMETRIC ANALYSIS PERFORMED MANUALLY The tumor cells are EQUIVOCAL for Her2 (2+). Her2 by FISH will be performed and results reported separately. Estrogen Receptor: 90%, POSITIVE, MODERATE STAINING INTENSITY Progesterone Receptor: 0%, NEGATIVE Proliferation Marker Ki67: 1%  FLUORESCENCE IN-SITU HYBRIDIZATION Results: GROUP 5: HER 2 **NEGATIVE** Equivocal form of amplification of the HER2 gene was detected in the IHC 2+ tissue sample received from this individual. HER2 FISH was performed by a technologist and cell imaging and analysis on the [employer]. RATIO OF ER2/CEN17 SIGNALS 1.15 AVERAGE HER2 COPY NUMBER PER CELL 1.95   12/24/2020 Initial Diagnosis   Malignant neoplasm of upper-outer quadrant of right breast in female,  estrogen receptor positive (HCC)   01/12/2021 Cancer Staging   Staging form: Breast, AJCC 8th Edition - Pathologic stage from 01/12/2021: Stage IA (pT1c, pN0(i+), cM0, G2, ER+, PR-, HER2-, Oncotype DX score: 22) - Signed by Lanny Callander, MD on 04/27/2021 Stage prefix: Initial diagnosis Multigene prognostic tests performed: Oncotype DX Recurrence score range: Greater than or equal to 11 Histologic grading system: 3 grade system Residual tumor (R): R0 - None   01/30/2021 Definitive Surgery   FINAL MICROSCOPIC DIAGNOSIS:   A. LYMPH NODE, RIGHT AXILLARY #1, SENTINEL, EXCISION:  -  Isolated tumor cells identified in one lymph node (0i/1)  -  See comment   B. LYMPH NODE, RIGHT AXILLARY, SENTINEL, EXCISION:  -  No carcinoma identified in one lymph node (0/1)  -  See comment   C. LYMPH NODE, RIGHT AXILLARY #2, SENTINEL, EXCISION:  -  No carcinoma identified in one lymph node (0/1)  -  See comment   D. LYMPH NODE, RIGHT AXILLARY, SENTINEL, EXCISION:  -  No carcinoma identified in one lymph node (0/1)  -  See comment   E. LYMPH NODE, RIGHT AXILLARY #3, SENTINEL, EXCISION:  -  No carcinoma identified in one lymph node (0/1)  -  See comment   F. LYMPH NODE, RIGHT AXILLARY #4, SENTINEL, EXCISION:  -  No carcinoma identified in one lymph node (0/1)  -  See comment   G. LYMPH NODE, RIGHT AXILLARY, SENTINEL, EXCISION:  -  No carcinoma identified in one lymph node (0/1)  -  See comment   H. LYMPH NODE, RIGHT AXILLARY #5, SENTINEL, EXCISION:  -  Benign fibroadipose tissue  -  No lymphoid tissue identified   I. BREAST, RIGHT, LUMPECTOMY:  -  Invasive lobular carcinoma, Nottingham grade 2 of 3, 1.5  cm  -  Lobular neoplasia (atypical lobular hyperplasia)  -  Margins uninvolved by carcinoma (0.7 cm; inferior margin)  -  Previous biopsy site changes present  -  See oncology table and comment below    01/30/2021 Miscellaneous   Oncotype Recurrence Score of 22, risk of distant metastasis is 8%  with antiestrogen therapy alone.   03/18/2021 - 04/15/2021 Radiation Therapy   Site Technique Total Dose (Gy) Dose per Fx (Gy) Completed Fx Beam Energies  Breast, Right: Breast_Rt 3D 40.05/40.05 2.67 15/15 10X  Breast, Right: Breast_Rt_Bst 3D 10/10 2 5/5 6X, 10X     05/2021 - 08/2022 Anti-estrogen oral therapy   Took low dose Tamoxifen  intermittently, stopped 08/2022. Did not tolerate full dose due to SE's. After she stopped, pt developed abnormal vaginal bleeding.    12/16/2022 Survivorship   SCP delivered by Lacie Burton, NP   05/20/2023 Imaging   Bilateral breast MRI with and without contrast IMPRESSION: 1. No evidence of new or recurrent breast carcinoma. 2. Benign post lumpectomy changes on the right  RECOMMENDATION: 1. Annual screening mammography, last bilateral exam performed on 08/15/2022. 2. Consider supplemental high risk screening breast MRI given patient's personal history breast malignancy.  BI-RADS CATEGORY  2: Benign.      Discussed the use of AI scribe software for clinical note transcription with the patient, who gave verbal consent to proceed.  History of Present Illness Carla Patrick is a 59 year old female with estrogen receptor positive right breast invasive lobular carcinoma on adjuvant endocrine therapy who presents for routine oncology follow-up.  She has been on adjuvant endocrine therapy since late 2022, initially tamoxifen  and now exemestane . Exemestane  causes significant fatigue and hot flashes that occur only while taking the medication. Symptoms typically worsen after 2-3 days of consistent use. Fatigue limits her theater activities and has led her to modify her schedule and intermittently stop exemestane  for about 1 month every 2-3 months or use every-other-day/intermittent dosing. She denies joint pain but acknowledges she has not been fully adherent due to these adverse effects.  She denies breast pain, new or enlarging breast masses, abnormal  bleeding, or bruising, though she occasionally notes a right breast twitch. Her most recent mammogram was in March 2025 and breast MRI in 2024.  She has valvular heart disease diagnosed in December of the prior year and is on medication for this. She has occasional palpitations that she attributes to the cardiac medication, without chest pain, pressure, syncope, or lower extremity edema.  She has had mild anemia over the past two years, with nadir hemoglobin 9.4 g/dL in August 2024 and current hemoglobin 12.1 g/dL without specific intervention.  She reports constipation and increased fatigue similar to her sister's symptoms. She denies abdominal pain, nausea, vomiting, or other new or worsening complaints.     All other systems were reviewed with the patient and are negative.  MEDICAL HISTORY:  Past Medical History:  Diagnosis Date   Abnormal EKG    Acute sinusitis    Allergic rhinitis    Anemia    Asthma    Cancer (HCC) 11/2020   right breast [facility]   Depression    Elevated blood pressure reading    Essential hypertension 02/12/2023   Heart murmur    mild no cardiologist   History of carpal tunnel release    History of radiation therapy    Right Breast- 03/18/21-04/15/21-Dr. Lynwood Nasuti   Hyperlipidemia    Hypertension    Morbid obesity (HCC) 02/12/2023  Morbid obesity due to excess calories (HCC)    Obese    Osteoarthritis    Palpitations 02/12/2023   PMB (postmenopausal bleeding)    Pre-diabetes    Pure hypercholesterolemia 02/12/2023   Sciatica of left side    Sinus infection    Thrombocytosis    Wears glasses     SURGICAL HISTORY: Past Surgical History:  Procedure Laterality Date   BREAST EXCISIONAL BIOPSY  2007   left   BREAST LUMPECTOMY WITH RADIOACTIVE SEED AND SENTINEL LYMPH NODE BIOPSY Right 01/30/2021   Procedure: RIGHT BREAST LUMPECTOMY WITH RADIOACTIVE SEED AND SENTINEL LYMPH NODE BIOPSY;  Surgeon: Curvin Deward MOULD, MD;  Location: [facility];  Service: General;  Laterality: Right;   CHOLECYSTECTOMY  10/04/2011   Procedure: LAPAROSCOPIC CHOLECYSTECTOMY WITH INTRAOPERATIVE CHOLANGIOGRAM;  Surgeon: Donnice Bury, MD;  Location: [facility];  Service: General;  Laterality: N/A;   DILATATION & CURETTAGE/HYSTEROSCOPY WITH MYOSURE N/A 01/09/2023   Procedure: DILATATION & CURETTAGE/HYSTEROSCOPY WITH MYOSURE;  Surgeon: Rutherford Gain, MD;  Location: [facility];  Service: Gynecology;  Laterality: N/A;    I have reviewed the social history and family history with the patient and they are unchanged from previous note.  ALLERGIES:  has no known allergies.  MEDICATIONS:  Current Outpatient Medications  Medication Sig Dispense Refill   albuterol  (PROAIR  HFA) 108 (90 Base) MCG/ACT inhaler Inhale 1-2 puffs into the lungs every 6 (six) hours as needed for wheezing or shortness of breath. 8 g 11   dapagliflozin  propanediol (FARXIGA ) 10 MG TABS tablet Take 1 tablet (10 mg total) by mouth daily before breakfast. 90 tablet 3   exemestane  (AROMASIN ) 25 MG tablet Take 1 tablet (25 mg total) by mouth daily after breakfast. 30 tablet 6   furosemide (LASIX) 20 MG tablet Take 20 mg by mouth daily.     metoprolol  succinate (TOPROL -XL) 50 MG 24 hr tablet Take 1 tablet (50 mg total) by mouth daily. Take with or immediately following a meal. 90 tablet 3   potassium chloride SA (KLOR-CON M) 20 MEQ tablet Take 20 mEq by mouth 2 (two) times daily.     rosuvastatin  (CRESTOR ) 20 MG tablet Take 1 tablet (20 mg total) by mouth daily. 90 tablet 3   SYMBICORT  160-4.5 MCG/ACT inhaler Inhale 2 puffs into the lungs in the morning and at bedtime. 1 each 11   telmisartan  (MICARDIS ) 40 MG tablet Take 1 tablet (40 mg total) by mouth daily. 90 tablet 1   tirzepatide (MOUNJARO) 2.5 MG/0.5ML Pen Inject 2.5 mg into the skin once a week.     No current facility-administered medications for this visit.    PHYSICAL EXAMINATION: ECOG PERFORMANCE STATUS: 0 -  Asymptomatic  Vitals:   06/20/24 0813  BP: 133/85  Pulse: 77  Resp: 17  Temp: 98.1 F (36.7 C)  SpO2: 100%   Wt Readings from Last 3 Encounters:  06/20/24 193 lb 9.6 oz (87.8 kg)  12/14/23 206 lb 6.4 oz (93.6 kg)  09/30/23 203 lb 6.4 oz (92.3 kg)     GENERAL:alert, no distress and comfortable SKIN: skin color, texture, turgor are normal, no rashes or significant lesions EYES: normal, Conjunctiva are pink and non-injected, sclera clear NECK: supple, thyroid normal size, non-tender, without nodularity LYMPH:  no palpable lymphadenopathy in the cervical, axillary  LUNGS: clear to auscultation and percussion with normal breathing effort HEART: regular rate & rhythm and no murmurs and no lower extremity edema ABDOMEN:abdomen soft, non-tender and normal bowel sounds Musculoskeletal:no  cyanosis of digits and no clubbing  NEURO: alert & oriented x 3 with fluent speech, no focal motor/sensory deficits BREAST: Right breast with minimal scar tissue, non-tender. Left breast benign, no palpable masses. Physical Exam   LABORATORY DATA:  I have reviewed the data as listed    Latest Ref Rng & Units 06/20/2024    7:37 AM 12/14/2023    9:59 AM 01/09/2023    9:09 AM  CBC  WBC 4.0 - 10.5 K/uL 5.8  6.9    Hemoglobin 12.0 - 15.0 g/dL 87.8  88.2  89.7   Hematocrit 36.0 - 46.0 % 35.6  34.4  30.0   Platelets 150 - 400 K/uL 355  322          Latest Ref Rng & Units 06/20/2024    7:37 AM 12/14/2023    9:59 AM 01/09/2023    9:09 AM  CMP  Glucose 70 - 99 mg/dL 884  856  862   BUN 6 - 20 mg/dL 9  10  14    Creatinine 0.44 - 1.00 mg/dL 9.09  9.16  9.29   Sodium 135 - 145 mmol/L 141  143  139   Potassium 3.5 - 5.1 mmol/L 4.1  3.9  3.8   Chloride 98 - 111 mmol/L 104  110  104   CO2 22 - 32 mmol/L 26  26    Calcium  8.9 - 10.3 mg/dL 9.6  9.2    Total Protein 6.5 - 8.1 g/dL 7.3  6.7    Total Bilirubin 0.0 - 1.2 mg/dL 0.8  0.8    Alkaline Phos 38 - 126 U/L 90  76    AST 15 - 41 U/L 18  13    ALT 0 -  44 U/L 28  18        RADIOGRAPHIC STUDIES: I have personally reviewed the radiological images as listed and agreed with the findings in the report. No results found.    No orders of the defined types were placed in this encounter.  All questions were answered. The patient knows to call the clinic with any problems, questions or concerns. No barriers to learning was detected. The total time spent in the appointment was 25 minutes, including review of chart and various tests results, discussions about plan of care and coordination of care plan     Onita Mattock, MD 06/20/2024     "

## 2024-12-08 ENCOUNTER — Inpatient Hospital Stay

## 2024-12-08 ENCOUNTER — Inpatient Hospital Stay: Admitting: Nurse Practitioner
